# Patient Record
Sex: Female | Born: 1985 | Race: White | Hispanic: No | Smoking: Current every day smoker
Health system: Southern US, Community
[De-identification: ages and names within clinical notes are randomized; demographics above are authoritative.]

## PROBLEM LIST (undated history)

## (undated) ENCOUNTER — Inpatient Hospital Stay (HOSPITAL_COMMUNITY): Payer: Self-pay

## (undated) ENCOUNTER — Emergency Department (HOSPITAL_COMMUNITY): Admission: EM | Payer: Medicaid Other | Source: Home / Self Care

## (undated) DIAGNOSIS — F53 Postpartum depression: Secondary | ICD-10-CM

## (undated) DIAGNOSIS — E559 Vitamin D deficiency, unspecified: Secondary | ICD-10-CM

## (undated) DIAGNOSIS — R51 Headache: Secondary | ICD-10-CM

## (undated) DIAGNOSIS — K279 Peptic ulcer, site unspecified, unspecified as acute or chronic, without hemorrhage or perforation: Secondary | ICD-10-CM

## (undated) DIAGNOSIS — F419 Anxiety disorder, unspecified: Secondary | ICD-10-CM

## (undated) DIAGNOSIS — J189 Pneumonia, unspecified organism: Secondary | ICD-10-CM

## (undated) DIAGNOSIS — IMO0002 Reserved for concepts with insufficient information to code with codable children: Secondary | ICD-10-CM

## (undated) DIAGNOSIS — K219 Gastro-esophageal reflux disease without esophagitis: Secondary | ICD-10-CM

## (undated) DIAGNOSIS — O99345 Other mental disorders complicating the puerperium: Principal | ICD-10-CM

## (undated) DIAGNOSIS — R87619 Unspecified abnormal cytological findings in specimens from cervix uteri: Secondary | ICD-10-CM

## (undated) DIAGNOSIS — F909 Attention-deficit hyperactivity disorder, unspecified type: Secondary | ICD-10-CM

## (undated) DIAGNOSIS — F319 Bipolar disorder, unspecified: Secondary | ICD-10-CM

## (undated) DIAGNOSIS — G988 Other disorders of nervous system: Secondary | ICD-10-CM

## (undated) DIAGNOSIS — E785 Hyperlipidemia, unspecified: Secondary | ICD-10-CM

## (undated) DIAGNOSIS — R569 Unspecified convulsions: Secondary | ICD-10-CM

## (undated) DIAGNOSIS — E039 Hypothyroidism, unspecified: Secondary | ICD-10-CM

## (undated) DIAGNOSIS — F329 Major depressive disorder, single episode, unspecified: Secondary | ICD-10-CM

## (undated) DIAGNOSIS — R87629 Unspecified abnormal cytological findings in specimens from vagina: Secondary | ICD-10-CM

## (undated) DIAGNOSIS — D649 Anemia, unspecified: Secondary | ICD-10-CM

## (undated) DIAGNOSIS — F32A Depression, unspecified: Secondary | ICD-10-CM

## (undated) HISTORY — DX: Hypothyroidism, unspecified: E03.9

## (undated) HISTORY — DX: Peptic ulcer, site unspecified, unspecified as acute or chronic, without hemorrhage or perforation: K27.9

## (undated) HISTORY — DX: Vitamin D deficiency, unspecified: E55.9

## (undated) HISTORY — PX: NO PAST SURGERIES: SHX2092

## (undated) HISTORY — DX: Postpartum depression: F53.0

## (undated) HISTORY — DX: Other disorders of nervous system: G98.8

## (undated) HISTORY — DX: Hyperlipidemia, unspecified: E78.5

## (undated) HISTORY — DX: Unspecified abnormal cytological findings in specimens from vagina: R87.629

## (undated) HISTORY — DX: Other mental disorders complicating the puerperium: O99.345

---
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
## 2003-10-30 ENCOUNTER — Other Ambulatory Visit: Admission: RE | Admit: 2003-10-30 | Discharge: 2003-10-30 | Payer: Self-pay

## 2003-12-31 ENCOUNTER — Inpatient Hospital Stay (HOSPITAL_COMMUNITY): Admission: AD | Admit: 2003-12-31 | Discharge: 2003-12-31 | Payer: Self-pay | Admitting: Obstetrics & Gynecology

## 2004-05-11 ENCOUNTER — Emergency Department (HOSPITAL_COMMUNITY): Admission: EM | Admit: 2004-05-11 | Discharge: 2004-05-11 | Payer: Self-pay | Admitting: Emergency Medicine

## 2004-07-10 ENCOUNTER — Emergency Department (HOSPITAL_COMMUNITY): Admission: EM | Admit: 2004-07-10 | Discharge: 2004-07-10 | Payer: Self-pay | Admitting: Emergency Medicine

## 2004-09-13 ENCOUNTER — Emergency Department (HOSPITAL_COMMUNITY): Admission: EM | Admit: 2004-09-13 | Discharge: 2004-09-13 | Payer: Self-pay | Admitting: Emergency Medicine

## 2004-11-28 ENCOUNTER — Inpatient Hospital Stay (HOSPITAL_COMMUNITY): Admission: AD | Admit: 2004-11-28 | Discharge: 2004-11-28 | Payer: Self-pay | Admitting: Obstetrics

## 2004-12-22 ENCOUNTER — Ambulatory Visit (HOSPITAL_COMMUNITY): Payer: Self-pay | Admitting: Psychiatry

## 2005-01-14 ENCOUNTER — Ambulatory Visit (HOSPITAL_COMMUNITY): Payer: Self-pay | Admitting: Psychiatry

## 2005-03-05 ENCOUNTER — Inpatient Hospital Stay (HOSPITAL_COMMUNITY): Admission: AD | Admit: 2005-03-05 | Discharge: 2005-03-05 | Payer: Self-pay | Admitting: Obstetrics

## 2005-06-23 ENCOUNTER — Ambulatory Visit (HOSPITAL_COMMUNITY): Payer: Self-pay | Admitting: Physician Assistant

## 2005-07-20 ENCOUNTER — Ambulatory Visit (HOSPITAL_COMMUNITY): Payer: Self-pay | Admitting: Psychiatry

## 2005-07-27 ENCOUNTER — Emergency Department (HOSPITAL_COMMUNITY): Admission: EM | Admit: 2005-07-27 | Discharge: 2005-07-27 | Payer: Self-pay | Admitting: Emergency Medicine

## 2005-07-31 ENCOUNTER — Ambulatory Visit (HOSPITAL_COMMUNITY): Payer: Self-pay | Admitting: Psychiatry

## 2005-09-11 ENCOUNTER — Ambulatory Visit (HOSPITAL_COMMUNITY): Payer: Self-pay | Admitting: Psychiatry

## 2005-09-17 ENCOUNTER — Emergency Department (HOSPITAL_COMMUNITY): Admission: EM | Admit: 2005-09-17 | Discharge: 2005-09-17 | Payer: Self-pay | Admitting: Emergency Medicine

## 2005-09-21 ENCOUNTER — Other Ambulatory Visit: Admission: RE | Admit: 2005-09-21 | Discharge: 2005-09-21 | Payer: Self-pay | Admitting: Obstetrics and Gynecology

## 2005-09-22 ENCOUNTER — Emergency Department (HOSPITAL_COMMUNITY): Admission: EM | Admit: 2005-09-22 | Discharge: 2005-09-22 | Payer: Self-pay | Admitting: Emergency Medicine

## 2005-10-01 ENCOUNTER — Ambulatory Visit (HOSPITAL_COMMUNITY): Admission: RE | Admit: 2005-10-01 | Discharge: 2005-10-01 | Payer: Self-pay | Admitting: Family Medicine

## 2005-10-12 ENCOUNTER — Ambulatory Visit (HOSPITAL_COMMUNITY): Admission: RE | Admit: 2005-10-12 | Discharge: 2005-10-12 | Payer: Self-pay | Admitting: Family Medicine

## 2006-02-12 ENCOUNTER — Encounter: Payer: Self-pay | Admitting: Internal Medicine

## 2006-02-12 ENCOUNTER — Inpatient Hospital Stay (HOSPITAL_COMMUNITY): Admission: AD | Admit: 2006-02-12 | Discharge: 2006-02-14 | Payer: Self-pay | Admitting: Internal Medicine

## 2006-02-14 ENCOUNTER — Inpatient Hospital Stay (HOSPITAL_COMMUNITY): Admission: AD | Admit: 2006-02-14 | Discharge: 2006-02-17 | Payer: Self-pay | Admitting: Psychiatry

## 2006-02-15 ENCOUNTER — Ambulatory Visit: Payer: Self-pay | Admitting: Psychiatry

## 2006-07-31 ENCOUNTER — Emergency Department (HOSPITAL_COMMUNITY): Admission: EM | Admit: 2006-07-31 | Discharge: 2006-07-31 | Payer: Self-pay | Admitting: Emergency Medicine

## 2007-03-23 ENCOUNTER — Emergency Department (HOSPITAL_COMMUNITY): Admission: EM | Admit: 2007-03-23 | Discharge: 2007-03-23 | Payer: Self-pay | Admitting: Emergency Medicine

## 2007-10-30 ENCOUNTER — Emergency Department (HOSPITAL_COMMUNITY): Admission: EM | Admit: 2007-10-30 | Discharge: 2007-10-30 | Payer: Self-pay | Admitting: Emergency Medicine

## 2007-12-04 ENCOUNTER — Emergency Department (HOSPITAL_COMMUNITY): Admission: EM | Admit: 2007-12-04 | Discharge: 2007-12-04 | Payer: Self-pay | Admitting: Emergency Medicine

## 2007-12-27 ENCOUNTER — Inpatient Hospital Stay (HOSPITAL_COMMUNITY): Admission: EM | Admit: 2007-12-27 | Discharge: 2007-12-29 | Payer: Self-pay | Admitting: Emergency Medicine

## 2007-12-31 ENCOUNTER — Emergency Department (HOSPITAL_COMMUNITY): Admission: EM | Admit: 2007-12-31 | Discharge: 2007-12-31 | Payer: Self-pay | Admitting: Emergency Medicine

## 2008-01-30 ENCOUNTER — Ambulatory Visit (HOSPITAL_COMMUNITY): Payer: Self-pay | Admitting: Psychiatry

## 2008-02-05 ENCOUNTER — Emergency Department (HOSPITAL_COMMUNITY): Admission: EM | Admit: 2008-02-05 | Discharge: 2008-02-05 | Payer: Self-pay | Admitting: Emergency Medicine

## 2008-02-15 ENCOUNTER — Ambulatory Visit (HOSPITAL_COMMUNITY): Payer: Self-pay | Admitting: Psychiatry

## 2008-03-12 ENCOUNTER — Observation Stay (HOSPITAL_COMMUNITY): Admission: EM | Admit: 2008-03-12 | Discharge: 2008-03-12 | Payer: Self-pay | Admitting: Emergency Medicine

## 2008-03-18 ENCOUNTER — Other Ambulatory Visit: Payer: Self-pay | Admitting: Emergency Medicine

## 2008-03-19 ENCOUNTER — Inpatient Hospital Stay (HOSPITAL_COMMUNITY): Admission: AD | Admit: 2008-03-19 | Discharge: 2008-03-21 | Payer: Self-pay | Admitting: *Deleted

## 2008-03-19 ENCOUNTER — Ambulatory Visit: Payer: Self-pay | Admitting: *Deleted

## 2008-03-20 ENCOUNTER — Encounter: Payer: Self-pay | Admitting: Emergency Medicine

## 2008-03-21 ENCOUNTER — Encounter: Payer: Self-pay | Admitting: Emergency Medicine

## 2008-03-22 ENCOUNTER — Inpatient Hospital Stay (HOSPITAL_COMMUNITY): Admission: RE | Admit: 2008-03-22 | Discharge: 2008-03-26 | Payer: Self-pay | Admitting: *Deleted

## 2008-04-05 ENCOUNTER — Inpatient Hospital Stay (HOSPITAL_COMMUNITY): Admission: AD | Admit: 2008-04-05 | Discharge: 2008-04-05 | Payer: Self-pay | Admitting: Obstetrics and Gynecology

## 2008-04-09 ENCOUNTER — Emergency Department (HOSPITAL_COMMUNITY): Admission: EM | Admit: 2008-04-09 | Discharge: 2008-04-09 | Payer: Self-pay | Admitting: Emergency Medicine

## 2008-04-10 ENCOUNTER — Ambulatory Visit (HOSPITAL_COMMUNITY): Admission: RE | Admit: 2008-04-10 | Discharge: 2008-04-10 | Payer: Self-pay | Admitting: Psychiatry

## 2008-04-13 ENCOUNTER — Ambulatory Visit (HOSPITAL_COMMUNITY): Payer: Self-pay | Admitting: Psychiatry

## 2008-04-20 ENCOUNTER — Other Ambulatory Visit: Payer: Self-pay | Admitting: Emergency Medicine

## 2008-04-20 ENCOUNTER — Inpatient Hospital Stay (HOSPITAL_COMMUNITY): Admission: AD | Admit: 2008-04-20 | Discharge: 2008-04-23 | Payer: Self-pay | Admitting: *Deleted

## 2008-04-23 ENCOUNTER — Emergency Department (HOSPITAL_COMMUNITY): Admission: EM | Admit: 2008-04-23 | Discharge: 2008-04-24 | Payer: Self-pay | Admitting: Emergency Medicine

## 2008-04-30 ENCOUNTER — Ambulatory Visit (HOSPITAL_COMMUNITY): Payer: Self-pay | Admitting: Psychiatry
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
## 2008-05-29 ENCOUNTER — Other Ambulatory Visit: Payer: Self-pay | Admitting: Emergency Medicine

## 2008-05-29 ENCOUNTER — Inpatient Hospital Stay (HOSPITAL_COMMUNITY): Admission: AD | Admit: 2008-05-29 | Discharge: 2008-06-01 | Payer: Self-pay | Admitting: *Deleted

## 2008-05-29 ENCOUNTER — Ambulatory Visit: Payer: Self-pay | Admitting: *Deleted

## 2008-06-04 ENCOUNTER — Other Ambulatory Visit: Payer: Self-pay | Admitting: Emergency Medicine

## 2008-06-06 ENCOUNTER — Inpatient Hospital Stay (HOSPITAL_COMMUNITY): Admission: RE | Admit: 2008-06-06 | Discharge: 2008-06-07 | Payer: Self-pay | Admitting: Psychiatry

## 2008-07-30 ENCOUNTER — Emergency Department (HOSPITAL_COMMUNITY): Admission: EM | Admit: 2008-07-30 | Discharge: 2008-07-30 | Payer: Self-pay | Admitting: Emergency Medicine

## 2008-08-01 ENCOUNTER — Emergency Department (HOSPITAL_COMMUNITY): Admission: EM | Admit: 2008-08-01 | Discharge: 2008-08-02 | Payer: Self-pay | Admitting: Emergency Medicine

## 2008-09-04 ENCOUNTER — Emergency Department (HOSPITAL_COMMUNITY): Admission: EM | Admit: 2008-09-04 | Discharge: 2008-09-05 | Payer: Self-pay | Admitting: *Deleted

## 2008-09-04 ENCOUNTER — Emergency Department (HOSPITAL_COMMUNITY): Admission: EM | Admit: 2008-09-04 | Discharge: 2008-09-05 | Payer: Self-pay | Admitting: Emergency Medicine

## 2008-09-05 ENCOUNTER — Emergency Department (HOSPITAL_COMMUNITY): Admission: EM | Admit: 2008-09-05 | Discharge: 2008-09-06 | Payer: Self-pay | Admitting: Emergency Medicine

## 2008-09-05 ENCOUNTER — Inpatient Hospital Stay (HOSPITAL_COMMUNITY): Admission: AD | Admit: 2008-09-05 | Discharge: 2008-09-05 | Payer: Self-pay | Admitting: Obstetrics & Gynecology

## 2008-09-06 ENCOUNTER — Emergency Department (HOSPITAL_COMMUNITY): Admission: EM | Admit: 2008-09-06 | Discharge: 2008-09-06 | Payer: Self-pay | Admitting: Emergency Medicine

## 2008-09-14 ENCOUNTER — Emergency Department (HOSPITAL_COMMUNITY): Admission: EM | Admit: 2008-09-14 | Discharge: 2008-09-14 | Payer: Self-pay | Admitting: Emergency Medicine

## 2008-09-24 ENCOUNTER — Ambulatory Visit: Payer: Self-pay | Admitting: Psychiatry

## 2008-09-24 ENCOUNTER — Inpatient Hospital Stay (HOSPITAL_COMMUNITY): Admission: RE | Admit: 2008-09-24 | Discharge: 2008-09-29 | Payer: Self-pay | Admitting: Psychiatry

## 2008-09-30 ENCOUNTER — Inpatient Hospital Stay (HOSPITAL_COMMUNITY): Admission: AD | Admit: 2008-09-30 | Discharge: 2008-09-30 | Payer: Self-pay | Admitting: Obstetrics and Gynecology

## 2008-10-06 ENCOUNTER — Inpatient Hospital Stay (HOSPITAL_COMMUNITY): Admission: EM | Admit: 2008-10-06 | Discharge: 2008-10-08 | Payer: Self-pay | Admitting: Psychiatry

## 2008-10-06 ENCOUNTER — Other Ambulatory Visit: Payer: Self-pay | Admitting: Emergency Medicine

## 2008-10-15 ENCOUNTER — Emergency Department (HOSPITAL_COMMUNITY): Admission: EM | Admit: 2008-10-15 | Discharge: 2008-10-15 | Payer: Self-pay | Admitting: Emergency Medicine

## 2008-10-19 ENCOUNTER — Emergency Department (HOSPITAL_COMMUNITY): Admission: EM | Admit: 2008-10-19 | Discharge: 2008-10-19 | Payer: Self-pay | Admitting: Emergency Medicine

## 2008-10-20 ENCOUNTER — Emergency Department (HOSPITAL_COMMUNITY): Admission: EM | Admit: 2008-10-20 | Discharge: 2008-10-20 | Payer: Self-pay | Admitting: Emergency Medicine

## 2008-10-28 ENCOUNTER — Emergency Department (HOSPITAL_COMMUNITY): Admission: EM | Admit: 2008-10-28 | Discharge: 2008-10-28 | Payer: Self-pay | Admitting: Emergency Medicine

## 2009-01-22 ENCOUNTER — Inpatient Hospital Stay (HOSPITAL_COMMUNITY): Admission: RE | Admit: 2009-01-22 | Discharge: 2009-01-25 | Payer: Self-pay | Admitting: *Deleted

## 2009-01-23 ENCOUNTER — Ambulatory Visit: Payer: Self-pay | Admitting: *Deleted

## 2009-01-26 ENCOUNTER — Inpatient Hospital Stay (HOSPITAL_COMMUNITY): Admission: AD | Admit: 2009-01-26 | Discharge: 2009-01-29 | Payer: Self-pay | Admitting: *Deleted

## 2009-02-08 ENCOUNTER — Other Ambulatory Visit: Payer: Self-pay | Admitting: Emergency Medicine

## 2009-02-09 ENCOUNTER — Inpatient Hospital Stay (HOSPITAL_COMMUNITY): Admission: RE | Admit: 2009-02-09 | Discharge: 2009-02-15 | Payer: Self-pay | Admitting: Psychiatry

## 2009-02-15 ENCOUNTER — Emergency Department (HOSPITAL_COMMUNITY): Admission: EM | Admit: 2009-02-15 | Discharge: 2009-02-16 | Payer: Self-pay | Admitting: Emergency Medicine

## 2009-02-17 ENCOUNTER — Other Ambulatory Visit: Payer: Self-pay | Admitting: Emergency Medicine

## 2009-02-18 ENCOUNTER — Inpatient Hospital Stay (HOSPITAL_COMMUNITY): Admission: RE | Admit: 2009-02-18 | Discharge: 2009-02-20 | Payer: Self-pay | Admitting: Psychiatry

## 2009-02-18 ENCOUNTER — Other Ambulatory Visit: Payer: Self-pay | Admitting: Emergency Medicine

## 2009-02-24 ENCOUNTER — Emergency Department (HOSPITAL_COMMUNITY): Admission: EM | Admit: 2009-02-24 | Discharge: 2009-02-24 | Payer: Self-pay | Admitting: Emergency Medicine

## 2009-02-26 ENCOUNTER — Inpatient Hospital Stay (HOSPITAL_COMMUNITY): Admission: RE | Admit: 2009-02-26 | Discharge: 2009-03-01 | Payer: Self-pay | Admitting: *Deleted

## 2009-02-26 ENCOUNTER — Ambulatory Visit: Payer: Self-pay | Admitting: *Deleted

## 2009-03-09 ENCOUNTER — Inpatient Hospital Stay (HOSPITAL_COMMUNITY): Admission: EM | Admit: 2009-03-09 | Discharge: 2009-03-13 | Payer: Self-pay | Admitting: *Deleted

## 2009-04-03 ENCOUNTER — Inpatient Hospital Stay (HOSPITAL_COMMUNITY): Admission: AD | Admit: 2009-04-03 | Discharge: 2009-04-04 | Payer: Self-pay | Admitting: Family Medicine

## 2009-04-10 ENCOUNTER — Emergency Department (HOSPITAL_COMMUNITY): Admission: EM | Admit: 2009-04-10 | Discharge: 2009-04-10 | Payer: Self-pay | Admitting: Emergency Medicine

## 2009-06-07 IMAGING — US US TRANSVAGINAL NON-OB
1 series · 14 of 25 positions shown · non-contrast
Comparison: Abdominal CT 09/06/2008.

CLINICAL DATA: Abdominal pain.  Pelvic pain.

TRANSABDOMINAL AND TRANSVAGINAL ULTRASOUND OF PELVIS
TECHNIQUE: Both transabdominal and transvaginal ultrasound
examinations of the pelvis were performed including evaluation of
the uterus, ovaries, adnexal regions, and pelvic cul-de-sac.

[Series 1: us transvaginal non-ob · 0.33mm/px · 14 of 32 slices shown]
[im 1/32]
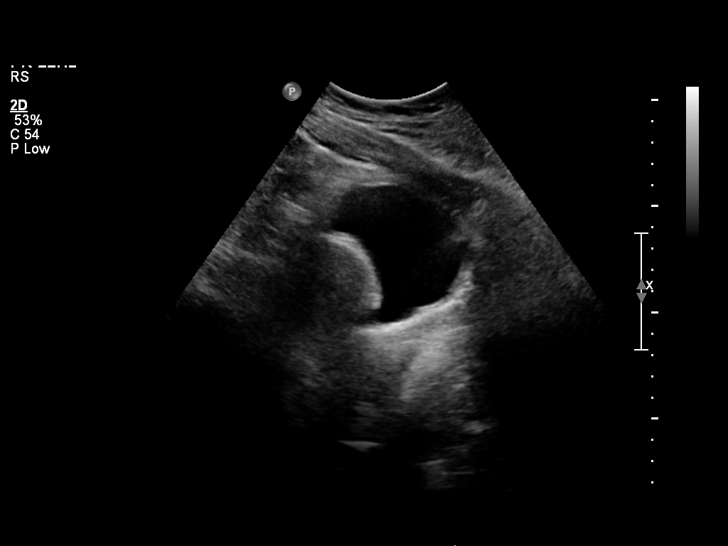
[im 3/32]
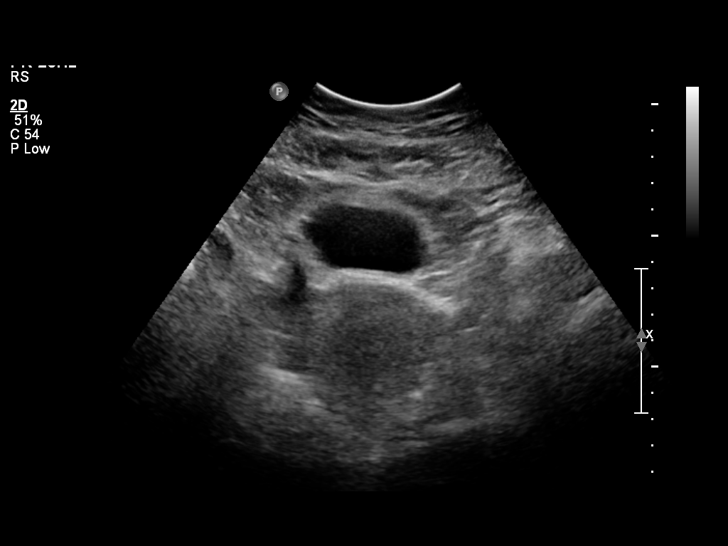
[im 6/32]
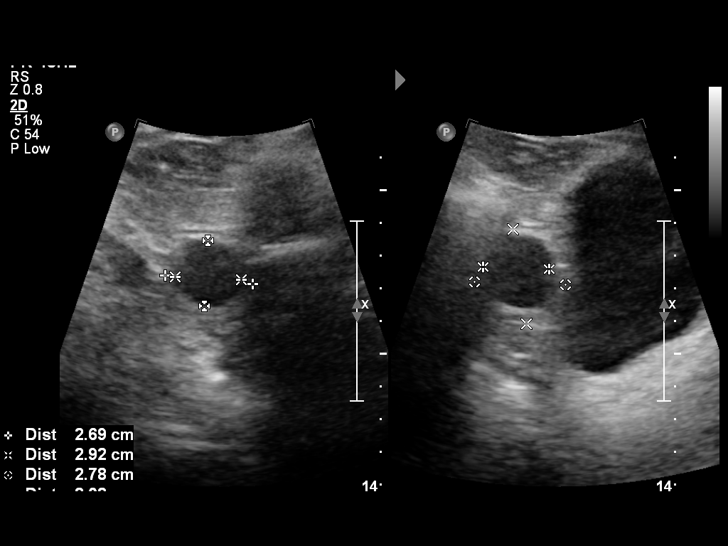
[im 8/32]
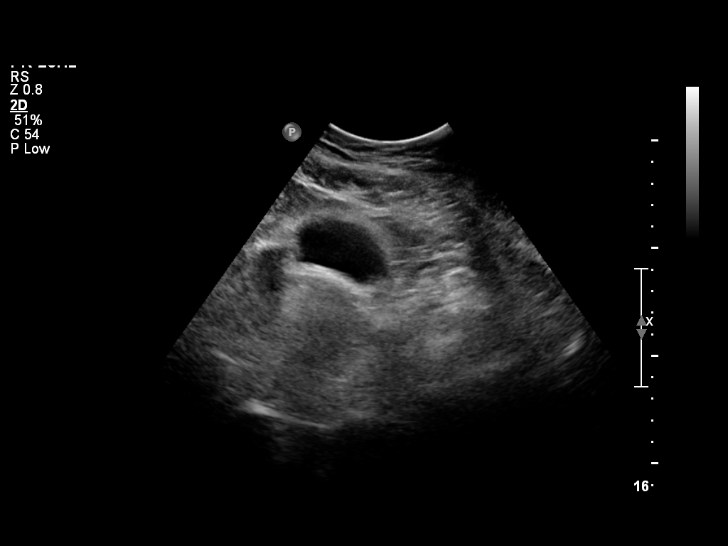
[im 11/32]
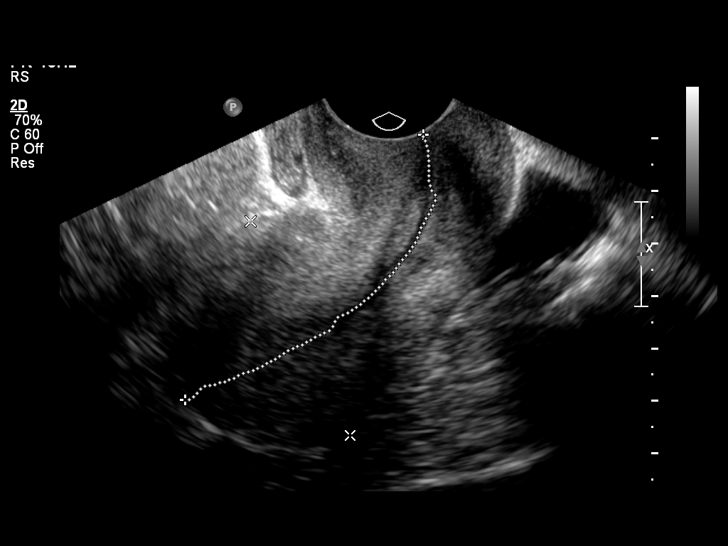
[im 12/32]
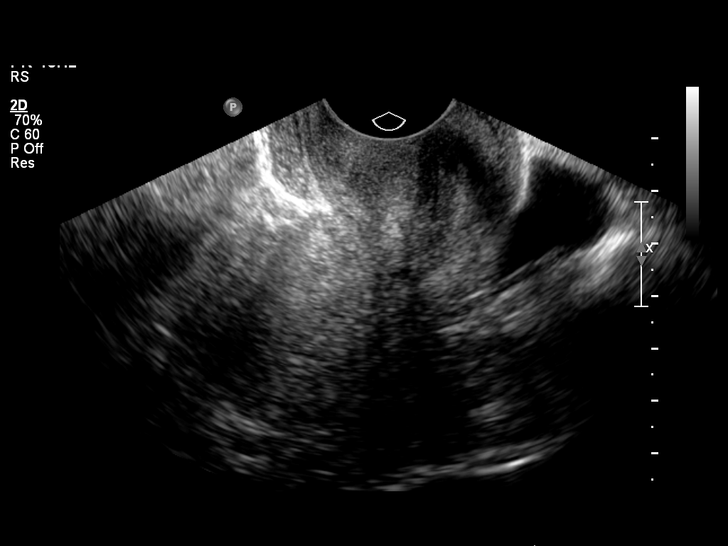
[im 15/32]
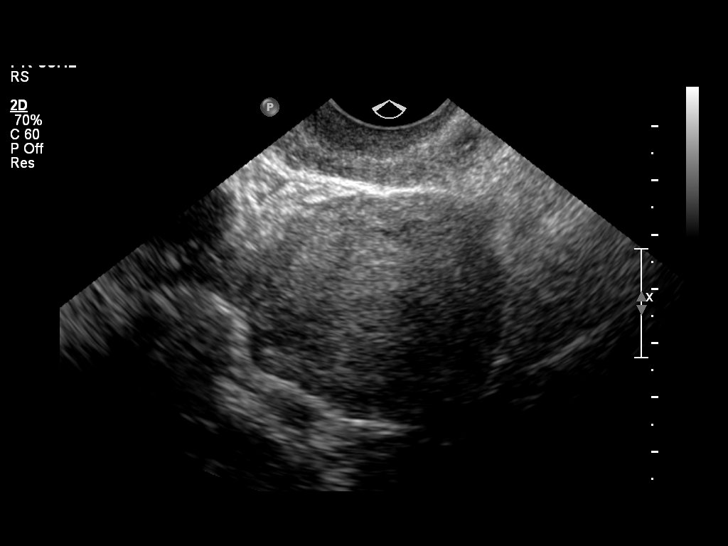
[im 17/32]
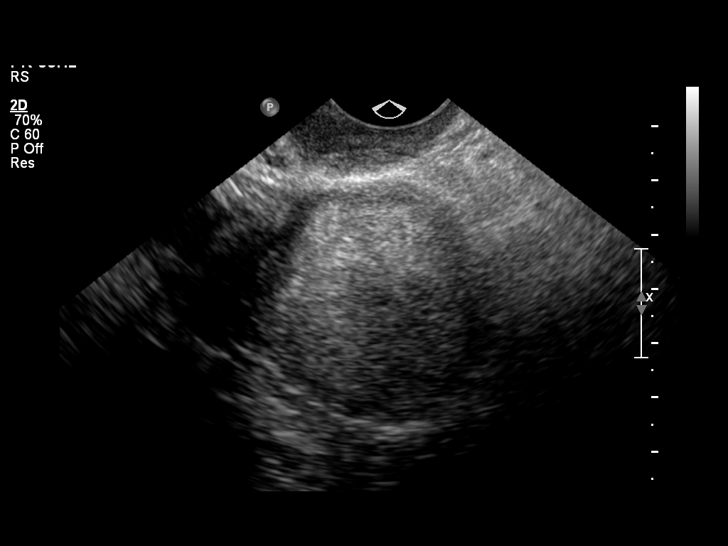
[im 20/32]
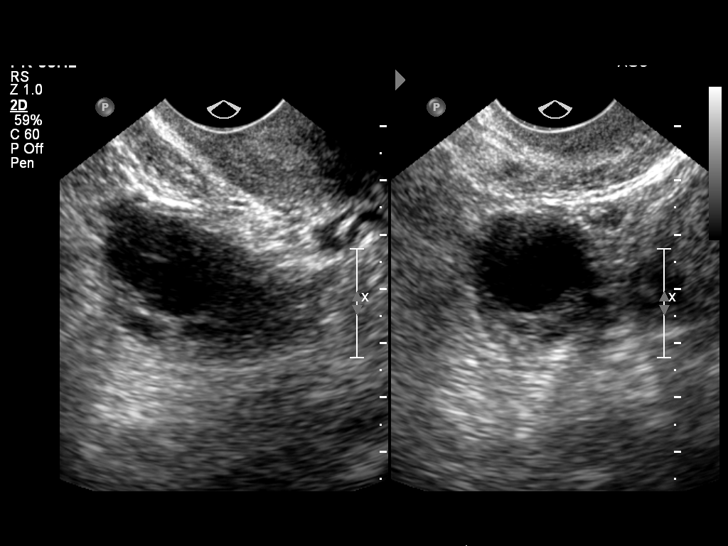
[im 21/32]
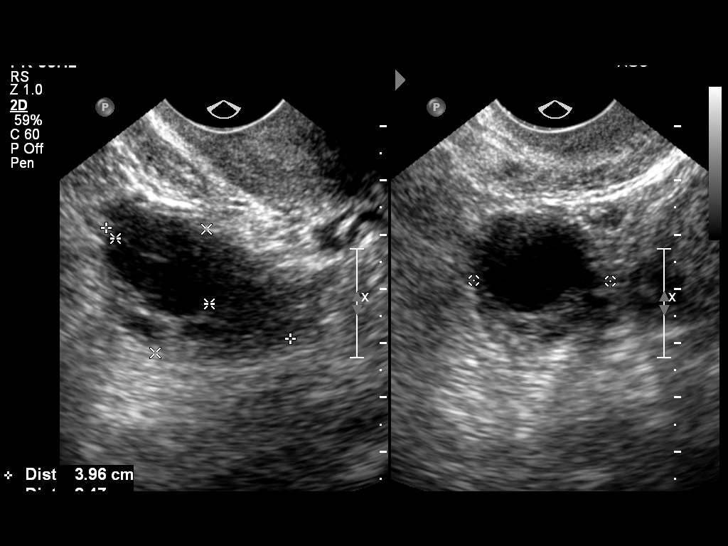
[im 24/32]
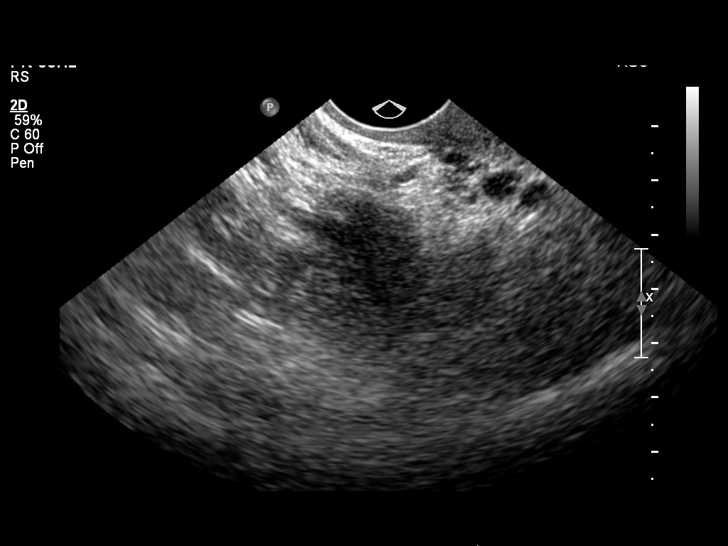
[im 26/32]
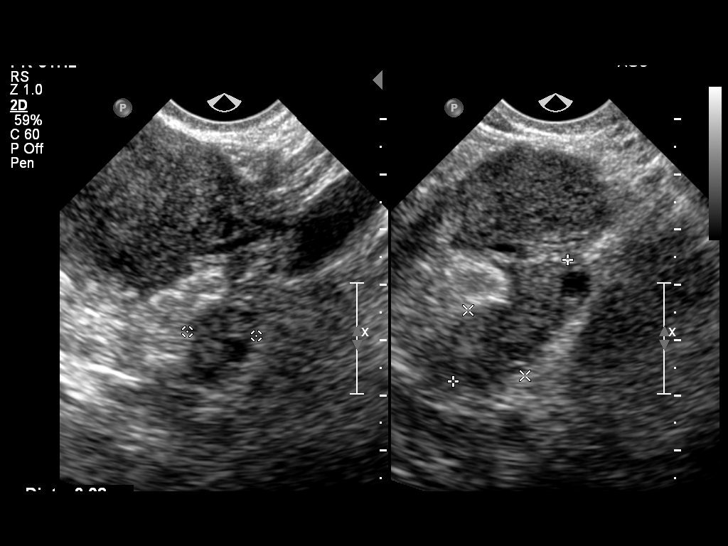
[im 29/32]
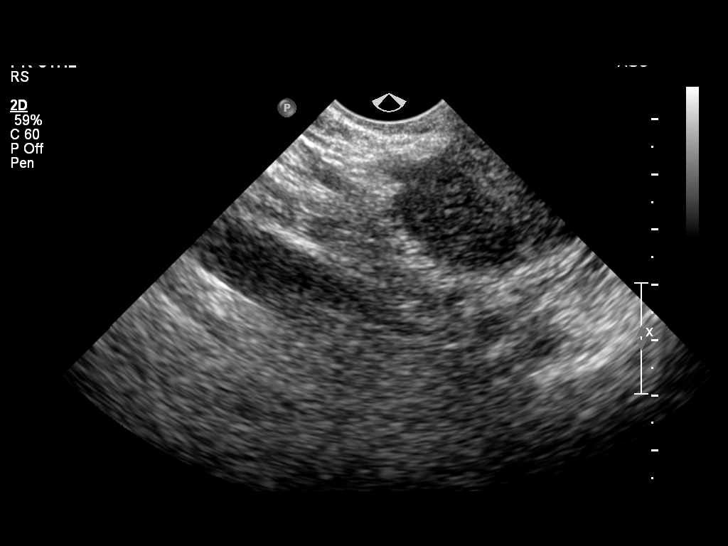
[im 32/32]
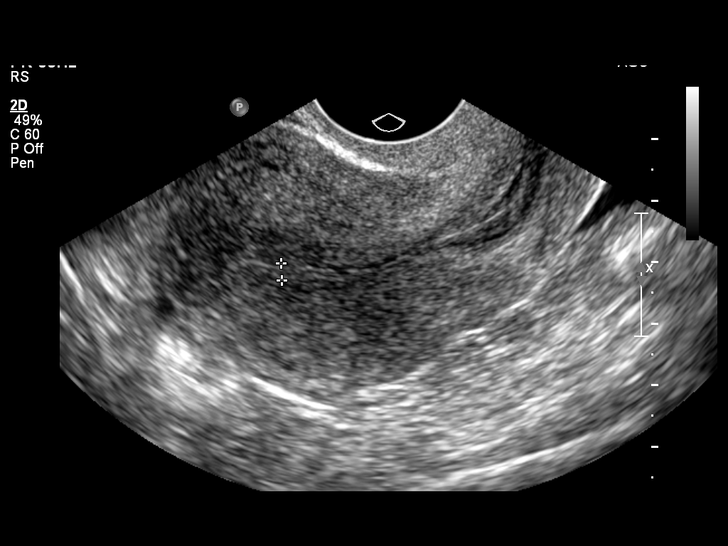

[14 of 25 positions shown; findings below may reference images not displayed]

FINDINGS: Uterus:  Uterus appears within normal limits, measuring

Endometrium:  Normal, 3 mm

Right Ovary:  4.0 x 2.5 x 2.5 cm.  2 cm dominant cyst or follicle
is present.  This appears simple and anechoic with increased
through transmission.

Left Ovary:  3.0 x 1.6 x 1.3 cm.  Normal echotexture.

Other Findings:  Small amount of simple free fluid is present in
the pouch of Filisberto.
IMPRESSION: Normal pelvic ultrasound aside from a small amount of free fluid in
the anatomic pelvis, which is probably physiologic.  This can be
associated with pelvic pain.

## 2009-06-26 ENCOUNTER — Emergency Department (HOSPITAL_COMMUNITY): Admission: EM | Admit: 2009-06-26 | Discharge: 2009-06-26 | Payer: Self-pay | Admitting: Emergency Medicine

## 2009-07-12 ENCOUNTER — Encounter: Admission: RE | Admit: 2009-07-12 | Discharge: 2009-07-12 | Payer: Self-pay | Admitting: Family Medicine

## 2009-07-28 ENCOUNTER — Other Ambulatory Visit: Payer: Self-pay | Admitting: Emergency Medicine

## 2009-07-29 ENCOUNTER — Ambulatory Visit: Payer: Self-pay | Admitting: *Deleted

## 2009-07-29 ENCOUNTER — Inpatient Hospital Stay (HOSPITAL_COMMUNITY): Admission: RE | Admit: 2009-07-29 | Discharge: 2009-07-30 | Payer: Self-pay | Admitting: *Deleted

## 2009-08-09 ENCOUNTER — Other Ambulatory Visit: Payer: Self-pay

## 2009-08-09 ENCOUNTER — Other Ambulatory Visit: Payer: Self-pay | Admitting: Emergency Medicine

## 2009-08-09 ENCOUNTER — Inpatient Hospital Stay (HOSPITAL_COMMUNITY): Admission: EM | Admit: 2009-08-09 | Discharge: 2009-08-11 | Payer: Self-pay | Admitting: *Deleted

## 2009-12-22 ENCOUNTER — Emergency Department (HOSPITAL_COMMUNITY): Admission: EM | Admit: 2009-12-22 | Discharge: 2009-12-24 | Payer: Self-pay | Admitting: Emergency Medicine

## 2009-12-30 ENCOUNTER — Other Ambulatory Visit: Payer: Self-pay

## 2009-12-31 ENCOUNTER — Ambulatory Visit: Payer: Self-pay | Admitting: Psychiatry

## 2009-12-31 ENCOUNTER — Inpatient Hospital Stay (HOSPITAL_COMMUNITY): Admission: AD | Admit: 2009-12-31 | Discharge: 2010-01-03 | Payer: Self-pay | Admitting: Psychiatry

## 2010-04-07 ENCOUNTER — Emergency Department (HOSPITAL_COMMUNITY): Admission: EM | Admit: 2010-04-07 | Discharge: 2010-04-07 | Payer: Self-pay | Admitting: Emergency Medicine

## 2010-08-12 ENCOUNTER — Emergency Department (HOSPITAL_COMMUNITY): Admission: EM | Admit: 2010-08-12 | Discharge: 2010-08-12 | Payer: Self-pay | Admitting: Emergency Medicine

## 2010-10-19 ENCOUNTER — Emergency Department (HOSPITAL_COMMUNITY): Admission: EM | Admit: 2010-10-19 | Discharge: 2010-10-19 | Payer: Self-pay | Admitting: Emergency Medicine

## 2010-10-31 ENCOUNTER — Emergency Department (HOSPITAL_COMMUNITY)
Admission: EM | Admit: 2010-10-31 | Discharge: 2010-10-31 | Payer: Self-pay | Source: Home / Self Care | Admitting: Emergency Medicine

## 2010-11-01 ENCOUNTER — Emergency Department (HOSPITAL_COMMUNITY)
Admission: EM | Admit: 2010-11-01 | Discharge: 2010-11-01 | Payer: Self-pay | Source: Home / Self Care | Admitting: Emergency Medicine

## 2010-11-02 ENCOUNTER — Emergency Department (HOSPITAL_COMMUNITY)
Admission: EM | Admit: 2010-11-02 | Discharge: 2010-11-02 | Payer: Self-pay | Source: Home / Self Care | Admitting: Emergency Medicine

## 2010-12-13 ENCOUNTER — Encounter: Payer: Self-pay | Admitting: Family Medicine

## 2010-12-14 ENCOUNTER — Encounter: Payer: Self-pay | Admitting: Obstetrics & Gynecology

## 2010-12-15 ENCOUNTER — Encounter: Payer: Self-pay | Admitting: Family Medicine

## 2011-02-03 LAB — BASIC METABOLIC PANEL
CO2: 22 mEq/L (ref 19–32)
Calcium: 9.4 mg/dL (ref 8.4–10.5)
Creatinine, Ser: 0.62 mg/dL (ref 0.4–1.2)
GFR calc Af Amer: >60 mL/min (ref >60)
GFR calc non Af Amer: >60 mL/min (ref >60)
Glucose, Bld: 108 mg/dL — ABNORMAL HIGH (ref 70–99)
Sodium: 137 mEq/L (ref 135–145)

## 2011-02-03 LAB — URINE MICROSCOPIC-ADD ON

## 2011-02-03 LAB — URINALYSIS, ROUTINE W REFLEX MICROSCOPIC
Bilirubin Urine: NEGATIVE
Specific Gravity, Urine: 1.01 (ref 1.005–1.030)
Urobilinogen, UA: 0.2 mg/dL (ref 0.0–1.0)
pH: 5.5 (ref 5.0–8.0)

## 2011-02-03 LAB — CBC
Hemoglobin: 13.9 g/dL (ref 12.0–15.0)
MCH: 29.8 pg (ref 26.0–34.0)
MCHC: 35.2 g/dL (ref 30.0–36.0)
Platelets: 286 10*3/uL (ref 150–400)
RBC: 4.66 MIL/uL (ref 3.87–5.11)

## 2011-02-03 LAB — DIFFERENTIAL
Eosinophils Absolute: 0 10*3/uL (ref 0.0–0.7)
Lymphocytes Relative: 12 % (ref 12–46)
Monocytes Absolute: 1.6 10*3/uL — ABNORMAL HIGH (ref 0.1–1.0)
Neutrophils Relative %: 81 % — ABNORMAL HIGH (ref 43–77)

## 2011-02-03 LAB — POCT PREGNANCY, URINE: Preg Test, Ur: NEGATIVE

## 2011-02-05 LAB — POCT I-STAT, CHEM 8
BUN: 10 mg/dL (ref 6–23)
Chloride: 104 mEq/L (ref 96–112)
Potassium: 3.8 mEq/L (ref 3.5–5.1)
Sodium: 137 mEq/L (ref 135–145)
TCO2: 24 mmol/L (ref 0–100)

## 2011-02-05 LAB — CBC
Platelets: 285 10*3/uL (ref 150–400)
RBC: 4.45 MIL/uL (ref 3.87–5.11)
RDW: 14.8 % (ref 11.5–15.5)
WBC: 13.5 10*3/uL — ABNORMAL HIGH (ref 4.0–10.5)

## 2011-02-05 LAB — POCT CARDIAC MARKERS
CKMB, poc: <1 ng/mL — ABNORMAL LOW (ref 1.0–8.0)
Myoglobin, poc: 29.6 ng/mL (ref 12–200)
Troponin i, poc: <0.05 ng/mL (ref 0.00–0.09)

## 2011-02-05 LAB — DIFFERENTIAL
Basophils Absolute: 0 10*3/uL (ref 0.0–0.1)
Lymphocytes Relative: 26 % (ref 12–46)
Lymphs Abs: 3.5 10*3/uL (ref 0.7–4.0)
Neutro Abs: 8.5 10*3/uL — ABNORMAL HIGH (ref 1.7–7.7)
Neutrophils Relative %: 63 % (ref 43–77)

## 2011-02-08 LAB — BASIC METABOLIC PANEL
CO2: 27 mEq/L (ref 19–32)
Calcium: 8.7 mg/dL (ref 8.4–10.5)
Creatinine, Ser: 0.67 mg/dL (ref 0.4–1.2)
GFR calc Af Amer: >60 mL/min (ref >60)
GFR calc non Af Amer: >60 mL/min (ref >60)

## 2011-02-08 LAB — CBC
MCHC: 34.7 g/dL (ref 30.0–36.0)
RBC: 4.33 MIL/uL (ref 3.87–5.11)
RDW: 13.4 % (ref 11.5–15.5)

## 2011-02-08 LAB — URINALYSIS, ROUTINE W REFLEX MICROSCOPIC
Protein, ur: NEGATIVE mg/dL
Urobilinogen, UA: 0.2 mg/dL (ref 0.0–1.0)

## 2011-02-08 LAB — DIFFERENTIAL
Basophils Absolute: 0.2 10*3/uL — ABNORMAL HIGH (ref 0.0–0.1)
Basophils Relative: 2 % — ABNORMAL HIGH (ref 0–1)
Lymphocytes Relative: 28 % (ref 12–46)
Monocytes Relative: 8 % (ref 3–12)
Neutro Abs: 6.6 10*3/uL (ref 1.7–7.7)
Neutrophils Relative %: 60 % (ref 43–77)

## 2011-02-08 LAB — RAPID URINE DRUG SCREEN, HOSP PERFORMED: Barbiturates: NOT DETECTED

## 2011-02-11 LAB — URINALYSIS, ROUTINE W REFLEX MICROSCOPIC
Glucose, UA: NEGATIVE mg/dL
Ketones, ur: NEGATIVE mg/dL
Specific Gravity, Urine: 1.016 (ref 1.005–1.030)
pH: 5 (ref 5.0–8.0)

## 2011-02-11 LAB — RAPID URINE DRUG SCREEN, HOSP PERFORMED
Barbiturates: NOT DETECTED
Benzodiazepines: NOT DETECTED

## 2011-02-11 LAB — COMPREHENSIVE METABOLIC PANEL
AST: 23 U/L (ref 0–37)
BUN: 3 mg/dL — ABNORMAL LOW (ref 6–23)
CO2: 25 mEq/L (ref 19–32)
Chloride: 104 mEq/L (ref 96–112)
Creatinine, Ser: 0.77 mg/dL (ref 0.4–1.2)
GFR calc non Af Amer: >60 mL/min (ref >60)
Total Bilirubin: <0.1 mg/dL — ABNORMAL LOW (ref 0.3–1.2)

## 2011-02-11 LAB — CBC
HCT: 41.7 % (ref 36.0–46.0)
Hemoglobin: 14.4 g/dL (ref 12.0–15.0)
MCV: 94.1 fL (ref 78.0–100.0)
RBC: 4.43 MIL/uL (ref 3.87–5.11)
WBC: 13.6 10*3/uL — ABNORMAL HIGH (ref 4.0–10.5)

## 2011-02-11 LAB — DIFFERENTIAL
Basophils Absolute: 0 10*3/uL (ref 0.0–0.1)
Eosinophils Relative: 1 % (ref 0–5)
Lymphocytes Relative: 27 % (ref 12–46)
Neutro Abs: 8.6 10*3/uL — ABNORMAL HIGH (ref 1.7–7.7)

## 2011-02-11 LAB — VALPROIC ACID LEVEL: Valproic Acid Lvl: 91.7 ug/mL (ref 50.0–100.0)

## 2011-02-17 ENCOUNTER — Emergency Department (HOSPITAL_COMMUNITY)
Admission: EM | Admit: 2011-02-17 | Discharge: 2011-02-17 | Discharge status: Against Medical Advice | Payer: Self-pay | Attending: Emergency Medicine | Admitting: Emergency Medicine

## 2011-02-17 DIAGNOSIS — R079 Chest pain, unspecified: Secondary | ICD-10-CM | POA: Insufficient documentation

## 2011-02-27 LAB — URINALYSIS, ROUTINE W REFLEX MICROSCOPIC
Bilirubin Urine: NEGATIVE
Glucose, UA: NEGATIVE mg/dL
Hgb urine dipstick: NEGATIVE
Hgb urine dipstick: NEGATIVE
Ketones, ur: NEGATIVE mg/dL
Nitrite: NEGATIVE
Specific Gravity, Urine: 1.01 (ref 1.005–1.030)
Urobilinogen, UA: 0.2 mg/dL (ref 0.0–1.0)
pH: 5.5 (ref 5.0–8.0)

## 2011-02-27 LAB — POCT I-STAT, CHEM 8
Creatinine, Ser: 0.8 mg/dL (ref 0.4–1.2)
Glucose, Bld: 78 mg/dL (ref 70–99)
Hemoglobin: 12.9 g/dL (ref 12.0–15.0)
Potassium: 3.7 mEq/L (ref 3.5–5.1)
TCO2: 21 mmol/L (ref 0–100)

## 2011-02-27 LAB — ETHANOL: Alcohol, Ethyl (B): <5 mg/dL (ref 0–10)

## 2011-02-27 LAB — RAPID URINE DRUG SCREEN, HOSP PERFORMED
Amphetamines: NOT DETECTED
Amphetamines: NOT DETECTED
Barbiturates: NOT DETECTED
Barbiturates: NOT DETECTED
Benzodiazepines: NOT DETECTED
Cocaine: NOT DETECTED
Opiates: NOT DETECTED
Opiates: NOT DETECTED
Tetrahydrocannabinol: NOT DETECTED

## 2011-02-27 LAB — BASIC METABOLIC PANEL
Calcium: 8.8 mg/dL (ref 8.4–10.5)
GFR calc Af Amer: >60 mL/min (ref >60)
GFR calc non Af Amer: >60 mL/min (ref >60)
Sodium: 140 mEq/L (ref 135–145)

## 2011-02-27 LAB — DIFFERENTIAL
Lymphocytes Relative: 29 % (ref 12–46)
Monocytes Absolute: 1.1 10*3/uL — ABNORMAL HIGH (ref 0.1–1.0)
Monocytes Relative: 8 % (ref 3–12)
Neutro Abs: 9.1 10*3/uL — ABNORMAL HIGH (ref 1.7–7.7)
Neutrophils Relative %: 62 % (ref 43–77)

## 2011-02-27 LAB — CBC
Hemoglobin: 13.9 g/dL (ref 12.0–15.0)
RBC: 4.41 MIL/uL (ref 3.87–5.11)
WBC: 14.8 10*3/uL — ABNORMAL HIGH (ref 4.0–10.5)

## 2011-03-03 LAB — COMPREHENSIVE METABOLIC PANEL
ALT: 13 U/L (ref 0–35)
AST: 18 U/L (ref 0–37)
AST: 18 U/L (ref 0–37)
Albumin: 3.8 g/dL (ref 3.5–5.2)
Albumin: 3.4 g/dL — ABNORMAL LOW (ref 3.5–5.2)
Alkaline Phosphatase: 77 U/L (ref 39–117)
Alkaline Phosphatase: 72 U/L (ref 39–117)
CO2: 23 mEq/L (ref 19–32)
Calcium: 9.1 mg/dL (ref 8.4–10.5)
Chloride: 106 mEq/L (ref 96–112)
Creatinine, Ser: 0.71 mg/dL (ref 0.4–1.2)
GFR calc Af Amer: >60 mL/min (ref >60)
GFR calc Af Amer: >60 mL/min (ref >60)
GFR calc non Af Amer: >60 mL/min (ref >60)
Potassium: 3.4 mEq/L — ABNORMAL LOW (ref 3.5–5.1)
Potassium: 3.4 mEq/L — ABNORMAL LOW (ref 3.5–5.1)
Sodium: 139 mEq/L (ref 135–145)
Total Bilirubin: 0.4 mg/dL (ref 0.3–1.2)
Total Protein: 6.5 g/dL (ref 6.0–8.3)

## 2011-03-03 LAB — DIFFERENTIAL
Basophils Absolute: 0.1 10*3/uL (ref 0.0–0.1)
Basophils Relative: 0 % (ref 0–1)
Basophils Relative: 1 % (ref 0–1)
Eosinophils Absolute: 0.1 10*3/uL (ref 0.0–0.7)
Eosinophils Absolute: 0.2 10*3/uL (ref 0.0–0.7)
Eosinophils Relative: 2 % (ref 0–5)
Lymphs Abs: 3.4 10*3/uL (ref 0.7–4.0)
Monocytes Absolute: 0.9 10*3/uL (ref 0.1–1.0)
Monocytes Absolute: 1 10*3/uL (ref 0.1–1.0)
Monocytes Relative: 7 % (ref 3–12)

## 2011-03-03 LAB — RAPID URINE DRUG SCREEN, HOSP PERFORMED
Amphetamines: NOT DETECTED
Opiates: NOT DETECTED
Tetrahydrocannabinol: NOT DETECTED

## 2011-03-03 LAB — WET PREP, GENITAL
Trich, Wet Prep: NONE SEEN
Yeast Wet Prep HPF POC: NONE SEEN

## 2011-03-03 LAB — URINALYSIS, ROUTINE W REFLEX MICROSCOPIC
Bilirubin Urine: NEGATIVE
Glucose, UA: NEGATIVE mg/dL
Hgb urine dipstick: NEGATIVE
Protein, ur: NEGATIVE mg/dL
Urobilinogen, UA: 0.2 mg/dL (ref 0.0–1.0)

## 2011-03-03 LAB — CBC
HCT: 29.1 % — ABNORMAL LOW (ref 36.0–46.0)
Hemoglobin: 10.1 g/dL — ABNORMAL LOW (ref 12.0–15.0)
MCV: 79.8 fL (ref 78.0–100.0)
Platelets: 228 10*3/uL (ref 150–400)
Platelets: 210 10*3/uL (ref 150–400)
RBC: 3.65 MIL/uL — ABNORMAL LOW (ref 3.87–5.11)
RDW: 17.3 % — ABNORMAL HIGH (ref 11.5–15.5)
WBC: 10 10*3/uL (ref 4.0–10.5)

## 2011-03-03 LAB — GC/CHLAMYDIA PROBE AMP, GENITAL: GC Probe Amp, Genital: NEGATIVE

## 2011-03-03 LAB — ACETAMINOPHEN LEVEL: Acetaminophen (Tylenol), Serum: <10 ug/mL — ABNORMAL LOW (ref 10–30)

## 2011-03-03 LAB — ETHANOL: Alcohol, Ethyl (B): <5 mg/dL (ref 0–10)

## 2011-03-04 LAB — CBC
HCT: 29.5 % — ABNORMAL LOW (ref 36.0–46.0)
Hemoglobin: 10 g/dL — ABNORMAL LOW (ref 12.0–15.0)
MCHC: 33.7 g/dL (ref 30.0–36.0)
MCV: 80 fL (ref 78.0–100.0)
Platelets: 275 10*3/uL (ref 150–400)
RBC: 4.11 MIL/uL (ref 3.87–5.11)
RBC: 3.62 MIL/uL — ABNORMAL LOW (ref 3.87–5.11)
RDW: 15.9 % — ABNORMAL HIGH (ref 11.5–15.5)
WBC: 9.2 10*3/uL (ref 4.0–10.5)

## 2011-03-04 LAB — URINE MICROSCOPIC-ADD ON

## 2011-03-04 LAB — COMPREHENSIVE METABOLIC PANEL
AST: 19 U/L (ref 0–37)
CO2: 23 mEq/L (ref 19–32)
Calcium: 8.8 mg/dL (ref 8.4–10.5)
Creatinine, Ser: 0.76 mg/dL (ref 0.4–1.2)
GFR calc Af Amer: >60 mL/min (ref >60)
GFR calc non Af Amer: >60 mL/min (ref >60)
Sodium: 138 mEq/L (ref 135–145)
Total Protein: 5.8 g/dL — ABNORMAL LOW (ref 6.0–8.3)

## 2011-03-04 LAB — DIFFERENTIAL
Eosinophils Relative: 1 % (ref 0–5)
Lymphocytes Relative: 25 % (ref 12–46)
Lymphs Abs: 2.3 10*3/uL (ref 0.7–4.0)
Monocytes Absolute: 1 10*3/uL (ref 0.1–1.0)
Monocytes Relative: 11 % (ref 3–12)

## 2011-03-04 LAB — POCT I-STAT, CHEM 8
BUN: 4 mg/dL — ABNORMAL LOW (ref 6–23)
Creatinine, Ser: 0.8 mg/dL (ref 0.4–1.2)
Sodium: 140 mEq/L (ref 135–145)
TCO2: 22 mmol/L (ref 0–100)

## 2011-03-04 LAB — URINALYSIS, ROUTINE W REFLEX MICROSCOPIC
Glucose, UA: NEGATIVE mg/dL
Hgb urine dipstick: NEGATIVE
Hgb urine dipstick: NEGATIVE
Ketones, ur: NEGATIVE mg/dL
Nitrite: NEGATIVE
Protein, ur: NEGATIVE mg/dL
Specific Gravity, Urine: 1.021 (ref 1.005–1.030)
Urobilinogen, UA: 0.2 mg/dL (ref 0.0–1.0)
pH: 6.5 (ref 5.0–8.0)

## 2011-03-04 LAB — DRUGS OF ABUSE SCREEN W/O ALC, ROUTINE URINE
Amphetamine Screen, Ur: NEGATIVE
Creatinine,U: 150.5 mg/dL
Marijuana Metabolite: POSITIVE — AB
Methadone: NEGATIVE
Opiate Screen, Urine: NEGATIVE
Propoxyphene: NEGATIVE

## 2011-03-04 LAB — POCT PREGNANCY, URINE: Preg Test, Ur: POSITIVE

## 2011-03-05 LAB — VALPROIC ACID LEVEL
Valproic Acid Lvl: 38.1 ug/mL — ABNORMAL LOW (ref 50.0–100.0)
Valproic Acid Lvl: 44 ug/mL — ABNORMAL LOW (ref 50.0–100.0)
Valproic Acid Lvl: 48.9 ug/mL — ABNORMAL LOW (ref 50.0–100.0)
Valproic Acid Lvl: 59.2 ug/mL (ref 50.0–100.0)
Valproic Acid Lvl: 63.8 ug/mL (ref 50.0–100.0)
Valproic Acid Lvl: 78.6 ug/mL (ref 50.0–100.0)

## 2011-03-05 LAB — CBC
HCT: 31.5 % — ABNORMAL LOW (ref 36.0–46.0)
HCT: 39 % (ref 36.0–46.0)
Hemoglobin: 10.3 g/dL — ABNORMAL LOW (ref 12.0–15.0)
Hemoglobin: 12.7 g/dL (ref 12.0–15.0)
MCV: 83.6 fL (ref 78.0–100.0)
Platelets: 250 10*3/uL (ref 150–400)
Platelets: 269 10*3/uL (ref 150–400)
RBC: 4.66 MIL/uL (ref 3.87–5.11)
RDW: 17 % — ABNORMAL HIGH (ref 11.5–15.5)
WBC: 10.8 10*3/uL — ABNORMAL HIGH (ref 4.0–10.5)
WBC: 9.9 10*3/uL (ref 4.0–10.5)
WBC: 10.8 10*3/uL — ABNORMAL HIGH (ref 4.0–10.5)

## 2011-03-05 LAB — COMPREHENSIVE METABOLIC PANEL
Albumin: 3.3 g/dL — ABNORMAL LOW (ref 3.5–5.2)
Alkaline Phosphatase: 75 U/L (ref 39–117)
BUN: 8 mg/dL (ref 6–23)
Chloride: 105 mEq/L (ref 96–112)
Glucose, Bld: 99 mg/dL (ref 70–99)
Potassium: 4 mEq/L (ref 3.5–5.1)
Total Bilirubin: 0.5 mg/dL (ref 0.3–1.2)

## 2011-03-05 LAB — POCT I-STAT, CHEM 8
BUN: 5 mg/dL — ABNORMAL LOW (ref 6–23)
Calcium, Ion: 1.12 mmol/L (ref 1.12–1.32)
Chloride: 107 mEq/L (ref 96–112)
HCT: 33 % — ABNORMAL LOW (ref 36.0–46.0)
HCT: 35 % — ABNORMAL LOW (ref 36.0–46.0)
Hemoglobin: 11.9 g/dL — ABNORMAL LOW (ref 12.0–15.0)
Potassium: 3.6 mEq/L (ref 3.5–5.1)
Sodium: 137 mEq/L (ref 135–145)
TCO2: 23 mmol/L (ref 0–100)

## 2011-03-05 LAB — BASIC METABOLIC PANEL
BUN: 4 mg/dL — ABNORMAL LOW (ref 6–23)
Calcium: 8.8 mg/dL (ref 8.4–10.5)
Calcium: 9 mg/dL (ref 8.4–10.5)
Chloride: 108 mEq/L (ref 96–112)
Creatinine, Ser: 0.69 mg/dL (ref 0.4–1.2)
GFR calc Af Amer: >60 mL/min (ref >60)
GFR calc Af Amer: >60 mL/min (ref >60)
GFR calc non Af Amer: >60 mL/min (ref >60)
GFR calc non Af Amer: >60 mL/min (ref >60)
Potassium: 3.5 mEq/L (ref 3.5–5.1)
Sodium: 137 mEq/L (ref 135–145)

## 2011-03-05 LAB — URINALYSIS, ROUTINE W REFLEX MICROSCOPIC
Bilirubin Urine: NEGATIVE
Glucose, UA: NEGATIVE mg/dL
Ketones, ur: NEGATIVE mg/dL
Leukocytes, UA: NEGATIVE
Nitrite: NEGATIVE
Protein, ur: NEGATIVE mg/dL
Protein, ur: NEGATIVE mg/dL
Specific Gravity, Urine: 1.008 (ref 1.005–1.030)
Specific Gravity, Urine: 1.017 (ref 1.005–1.030)
Urobilinogen, UA: 0.2 mg/dL (ref 0.0–1.0)
pH: 5.5 (ref 5.0–8.0)
pH: 7.5 (ref 5.0–8.0)

## 2011-03-05 LAB — DRUGS OF ABUSE SCREEN W/O ALC, ROUTINE URINE
Amphetamine Screen, Ur: NEGATIVE
Marijuana Metabolite: NEGATIVE
Propoxyphene: NEGATIVE

## 2011-03-05 LAB — DIFFERENTIAL
Basophils Absolute: 0.1 10*3/uL (ref 0.0–0.1)
Eosinophils Absolute: 0.1 10*3/uL (ref 0.0–0.7)
Eosinophils Relative: 2 % (ref 0–5)
Lymphocytes Relative: 31 % (ref 12–46)
Lymphs Abs: 3 10*3/uL (ref 0.7–4.0)
Lymphs Abs: 3.4 10*3/uL (ref 0.7–4.0)
Neutro Abs: 5.8 10*3/uL (ref 1.7–7.7)
Neutro Abs: 6.2 10*3/uL (ref 1.7–7.7)
Neutrophils Relative %: 58 % (ref 43–77)

## 2011-03-05 LAB — GASTRIC OCCULT BLOOD (1-CARD TO LAB)
Occult Blood, Gastric: POSITIVE — AB
pH, Gastric: 5

## 2011-03-05 LAB — PREGNANCY, URINE
Preg Test, Ur: NEGATIVE
Preg Test, Ur: NEGATIVE

## 2011-03-05 LAB — URINE MICROSCOPIC-ADD ON

## 2011-03-05 LAB — TSH: TSH: 1.977 u[IU]/mL (ref 0.350–4.500)

## 2011-03-05 LAB — RAPID URINE DRUG SCREEN, HOSP PERFORMED
Barbiturates: NOT DETECTED
Benzodiazepines: NOT DETECTED
Cocaine: NOT DETECTED
Opiates: NOT DETECTED
Tetrahydrocannabinol: NOT DETECTED

## 2011-03-05 LAB — POCT PREGNANCY, URINE: Preg Test, Ur: NEGATIVE

## 2011-03-05 LAB — ETHANOL
Alcohol, Ethyl (B): <5 mg/dL (ref 0–10)
Alcohol, Ethyl (B): <5 mg/dL (ref 0–10)

## 2011-04-07 NOTE — H&P (Signed)
[name], [name]:  [id number]   MEDICAL RECORD NO.:  [id number]          PATIENT TYPE:  IPS   LOCATION:  0305                          [facility]ACILITY:  [facility]   PHYSICIAN:  Anselm Jungling, MD  DATE OF BIRTH:  04/20/1986   DATE OF ADMISSION:  02/18/2009  DATE OF DISCHARGE:                       PSYCHIATRIC ADMISSION ASSESSMENT   This is a 25 year old single female voluntarily admitted on February 18, 2009.   HISTORY OF PRESENT ILLNESS:  The patient states she is here because she  is hearing voices again, voices telling her to kill herself.  She did  not harm herself but felt she needed to come in for safety purposes.  The patient was recently discharged from our facility on the 26th of  March and had a hospitalization prior 2 weeks prior to that and  discharged on the 26th.  She reports compliance with medication.  She  denies any specific stressors making her symptoms worse.  Sleeping well.  Appetite has been satisfactory.  Denies any alcohol or substance use.  The patient reports one of her reasons for admission is that she felt  she needed to be on the Seroquel on an as-needed basis to help out with  her anxiety and hallucinations that helped her feel more stable.   PAST PSYCHIATRIC HISTORY:  The patient has had multiple admissions to  our facility, again with a recent discharge on March 26th with another  visit approximately 11 days prior to that.  Sees Dr. Allyne Gee for  outpatient mental health services.   SOCIAL HISTORY:  The patient lives with her father and her 55-year-old  daughter.  Reports that it is a comfortable situation for her.  She is  on disability.  No legal problems.   FAMILY HISTORY:  Grandfather with history of bipolar disorder and father  history of depression and on medication and is unclear to what that  medication is.   ALCOHOL AND DRUG HISTORY:  The patient smokes.  She denies any alcohol  or drug use.   PRIMARY CARE Kienan Doublin:   Dr. Jeannetta Nap.   MEDICAL PROBLEMS:  History of pseudoseizures and GERD.   MEDICATIONS:  1. Neurontin 300 mg t.i.d.  2. Cymbalta 40 mg daily.  3. Abilify 30 mg at h.s.  4. Depakote ER 500 mg twice day.  5. Protonix 40 mg daily.   DRUG ALLERGIES:  PENICILLIN AND SULFA.   PHYSICAL EXAMINATION:  GENERAL:  This is a young female, fully assessed  at [facility] emergency apartment.  Her physical exam was reviewed with  no significant findings.  The patient is in the bed at this time.  Voices no complaints and appears in no distress.  VITAL SIGNS:  Temperature of 98.3, heart rate 104, respirations 18,  blood pressure is 150/101.  She is 5 feet 4 inches tall, 215 pounds.   Her laboratory data shows a pregnancy test is negative.  Urinalysis is  negative.  Hemoglobin of 11.2 and hematocrit of 33.   MENTAL STATUS EXAM:  The patient again is in the bed, sleepy, but  arouses readily and willing to answer  questions.  Her speech is soft-  spoken, normal pace.  Mood is depressed.  The patient is flat.  Thought  processes are coherent.  She denies any hallucinations at this time.  Denies any suicidal thoughts and contracts for safety.  Cognitive  function intact.  Her judgment seems to be fair.  Insight is minimal.   DISCHARGE DIAGNOSES:  AXIS I:  Bipolar disorder type 2.  AXIS II:  Deferred.  AXIS III:  Gastroesophageal reflux disease.  History of pseudoseizures.  AXIS IV:  Moderate stressors.  AXIS V:  Current is 30 to 35.   The plan is to continue her medications at this time.  Will continue to  continue identify the patient's stressors and other comorbidities.  Will  reinforce medication compliance and followup.  The patient will be in  the blue group.  The patient is to follow up with her appointment with  Meredith Leeds on April 5th for therapy.  Her tentative length of stay at  this time is 3 to 5 days.      Landry Corporal, N.P.      Anselm Jungling, MD  Electronically  Signed    JO/MEDQ  D:  02/19/2009  T:  02/19/2009  Job:  [id number]
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
## 2011-04-07 NOTE — Discharge Summary (Signed)
[name], [name]:  [id number]   MEDICAL RECORD NO.:  [id number]          PATIENT TYPE:  IPS   LOCATION:  0301                          [facility]ACILITY:  [facility]   PHYSICIAN:  Jasmine Pang, M.D. DATE OF BIRTH:  Mar 19, 1986   DATE OF ADMISSION:  03/09/2009  DATE OF DISCHARGE:  03/13/2009                               DISCHARGE SUMMARY   IDENTIFICATION:  This is a 25 year old divorced white female who was  admitted on a voluntary basis on March 09, 2009.   HISTORY OF PRESENT ILLNESS:  This is the patient's 6th admission since  January 22, 2009.  She presented as a walk-in on the day before admission,  reporting auditory hallucination.  She has a history of bipolar disorder  and states she had been feeling depressed.  For further admission  information, see psychiatric admission assessment.   PHYSICAL FINDINGS:  There were no acute physical or medical problems  noted.   ADMISSION LABORATORIES:  CBC with differential was remarkable for  slightly elevated WBC at 13 (4 to 10.5).  Hemoglobin and hematocrit were  decreased at 11.1 and 32.9.  RDW was high at 15.9 (11.5 to 15.5).  Comprehensive metabolic panel was remarkable for an elevated glucose of  143, BUN was low at 4 (6 to 23).  Bilirubin was low at 0.2, total  protein was low at 5.8, albumin was low at 3.3.  Depakote level was 49.2  (50 to 100).  UDS was positive for marijuana and otherwise negative.   HOSPITAL COURSE:  Upon admission, the patient was restarted on her home  medications of Depakote 500 mg p.o. b.i.d., Neurontin 300 mg t.i.d.,  Cymbalta 20 mg b.i.d., Protonix 40 mg daily, Abilify 20 mg daily, Haldol  5 mg p.o. t.i.d., Seroquel 50 mg p.o. t.i.d., and Ambien 10 mg p.o.  q.h.s. p.r.n. insomnia.  In individual sessions, the patient was  initially disheveled with absent eye contact.  There was psychomotor  retardation.  Speech was soft and slow.  She was lying in bed, very  sleepy when I talked with her.   She was drowsy and lethargic.  Mood was  depressed and anxious.  Affect depressed, constricted.  There was no  evidence of thought disorder.  No evidence of paranoia or delusions.  She was having hallucinations telling me to hurt myself.  On March 11, 2009, she continued to have psychomotor retardation with poor eye  contact.  Mood was anxious and depressed.  However, there was no  suicidal ideation and no auditory hallucinations.  On March 12, 2009,  the patient stated she felt less depressed and less anxious.  She wanted  to go home.  She had signed a request for discharge.  Her sleep was good  and appetite was good.  On March 13, 2009, mental status had improved  markedly from admission status.  She was going to groups.  Mood was  euthymic.  Affect was consistent with mood.  Psychomotor activity was  within normal limits.  She had good eye contact.  Speech, normal rate  and flow.  There was  no suicidal or homicidal ideation.  No thoughts of  self-injurious behavior.  No auditory or visual hallucinations.  No  paranoia or delusions.  Thoughts were logical and goal-directed.  Thought content no predominant theme.  Cognitive was grossly intact.  Insight fair.  Judgment fair.  Impulse control good.  She wanted to go  home today.  She wanted to see her little girl.  She was felt to be safe  for discharge.   DISCHARGE DIAGNOSES:  Axis I:  Bipolar disorder, not otherwise specified  with psychotic features, marijuana abuse.  Axis II:  Borderline personality disorder.  Axis III:  Headache, pseudoseizures.  Axis IV:  Moderate (chronic mental illness, medical problems).  Axis V:  Global assessment of functioning was 50 upon discharge.  GAF  was 25 upon admission.  GAF highest past year was 65.   DISCHARGE PLANS:  There was no specific activity level or dietary  restrictions.   POSTHOSPITAL CARE PLANS:  The patient will go to [facility] on  March 21, 2009, at 1 p.m.  She will also go  to [facility] for  counseling on March 18, 2009, at 2:00 p.m.   DISCHARGE MEDICATIONS:  1. Depakote tablets 500 mg twice daily.  2. Neurontin 300 mg 3 times daily.  3. Cymbalta 20 mg twice daily.  4. Protonix 40 mg daily.  5. Abilify 20 mg daily.  6. Haldol 5 mg p.o. t.i.d.  7. Seroquel 50 mg t.i.d.  8. Ambien 10 mg at bedtime if needed for sleep.      Jasmine Pang, M.D.  Electronically Signed     BHS/MEDQ  D:  03/13/2009  T:  03/14/2009  Job:  [id number]
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
## 2011-04-07 NOTE — Consult Note (Signed)
[name], [name]:  [id number]   MEDICAL RECORD NO.:  [id number]          PATIENT TYPE:  INP   LOCATION:  6710                         FACILITY:  [facility]   PHYSICIAN:  Antonietta Breach, M.D.  DATE OF BIRTH:  18-Nov-1986   DATE OF CONSULTATION:  DATE OF DISCHARGE:  12/29/2007                                 CONSULTATION   REQUESTING PHYSICIAN:  INCOMPASS A TEAM.   REASON FOR CONSULTATION:  Depression.   HISTORY OF PRESENT ILLNESS:  [name] is a 25 year old female admitted  to the [facility] on December 26, 2007 with a seizure.  The patient  had been having approximately 3 days of decreased energy.  She had some  difficulty concentrating.  She was having a number of episodes that  occurred over the week prior to admission.  These involved deja vu  experiences and auras, also the patient had some jerking movements.  She  had been taking her seizure medicine which consisted of Depakote and  Topamax.  The patient was also taking Effexor at 75 mg per day along  with BuSpar 15 mg twice daily.   At the time of the undersigned exam, she is not displaying any abnormal  physical behavior.  She remains alert.  She has intact orientation and  memory function.  She has constructive future goals and interests.  She  is not having any thoughts of harming herself or others.  She has no  delusions or hallucinations.   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  She was loaded with Depacon and titrated  to a maintenance dose of 500 mg q.a.m. and 1000 mg q.h.s.   The patient did state to the admitting physician that her overall  physical appearance was similar to a character who played Eligah East in  a movie.  The patient does not have any odd conception at the time of  examination.   PAST PSYCHIATRIC HISTORY:  In review of the past medical record, the  patient has anxiety and depression listed along with  bipolar disorder.  Her Effexor has been discontinued.  She does remain on the BuSpar 15 mg  b.i.d. as well as Depakote.   FAMILY PSYCHIATRIC HISTORY:  None known.   SOCIAL HISTORY:  [name] is single.  She does have a child.  She  lives in [place] with her boyfriend.  In terms of substance abuse she  has smoked marijuana in the past.  Drinks alcohol on occasion.  She is  unemployed.   PAST MEDICAL HISTORY:  She has a history of traumatic brain injury.  She  does have a history of seizures.   ALLERGIES:  SULFA AND PENICILLIN.   MEDICATIONS:  MAR is reviewed.  She is on BuSpar 15 mg b.i.d. and  Depakote as mentioned above.   A head CT without contrast on December 26, 2007 was normal.   LABORATORY DATA:  Depakote level 47.3.  Prolactin was 35.7, sodium 140,  BUN 6, creatinine 0.71, SGOT 18, SGPT 26.  WBC 10, hemoglobin 13.5, and  platelet count  206.   REVIEW OF SYSTEMS:  Constitutional, head, eyes, ears, nose, throat,  mouth, neurologic, psychiatric, cardiovascular, respiratory,  gastrointestinal, genitourinary, skin, musculoskeletal, hematologic,  lymphatic, endocrine, and metabolic all unremarkable.   PHYSICAL EXAMINATION:  VITAL SIGNS:  Temperature 98.8, pulse 84,  respiratory rate 20, blood pressure 99/63, and O2 saturation on room air  96%.  GENERAL APPEARANCE:  [name] is a young female reclining in a supine  position in her hospital bed with no abnormal involuntary movements.  She is socially appropriate.   MENTAL STATUS EXAM:  [name] is alert.  Her attention span is within  normal limits.  Her eye contact is normal.  She is oriented to all  spheres.  Her affect is slightly anxious.  Her mood is within normal  limits.  Her memory is intact to immediate, recent and remote, except  for some of the periods mentioned above.  Her speech involves normal  rate and prosody while without dysarthria.  Thought process is logical,  coherent, and goal-directed.  No  looseness of associations.  Thought  content:  No thoughts of harming herself.  No thoughts of harming  others.  No delusions.  No hallucinations.  Insight is good.  Judgment  is intact.   ASSESSMENT:  AXIS I:  294.9 unspecified persistent mental disorder, not  otherwise specified, rule out somatoform disorder, not otherwise  specified.   293.83 mood disorder, not otherwise specified.  The patient may have a  history of bipolar disorder.  She also has listed a history of  depression and her mood at this time is stable.  She does have general  medical factors including a history of traumatic brain injury.   293.84 anxiety disorder, not otherwise specified.   History of cannabis abuse.   AXIS II:  Deferred.  AXIS III:  See past medical history.  AXIS IV:  General medical.  AXIS V:  55.   [name] is not at risk to harm herself or others.  She agrees to call  emergency services immediately for any thoughts of harming herself or  other psychiatric emergency symptoms.   The undersigned provided ego supportive psychotherapy and education.  We  will proceed with her Depakote regimen.  She will also remain on her  BuSpar.   Outpatient psychiatric followup can be obtained at one of the clinics  attached to [facility] or [facility] in  addition to psychotropic medication management.  This patient could  benefit from a course of  psychotherapy with a therapist that has training and experience in  addressing somatoform disorders, if a somatoform process is present the  conditions of psychotherapy will relieve it, also the patient could use  psychotherapy to help her with mood and anxiety prevention.      Antonietta Breach, M.D.  Electronically Signed     JW/MEDQ  D:  04/11/2008  T:  04/12/2008  Job:  [id number]
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
## 2011-04-07 NOTE — Discharge Summary (Signed)
NAMEISLAH, EVE NO.:  [id number]   MEDICAL RECORD NO.:  [id number]          PATIENT TYPE:  IPS   LOCATION:  0600                          FACILITY:  [facility]   PHYSICIAN:  Jasmine Pang, M.D. DATE OF BIRTH:  20-Sep-1986   DATE OF ADMISSION:  05/29/2008  DATE OF DISCHARGE:  06/01/2008                               DISCHARGE SUMMARY   IDENTIFICATION:  This is a 25 year old divorced white female from  [place], [place].   HISTORY OF PRESENT ILLNESS:  The patient states I am hearing voices.  She states she hears voices telling her to hurt her sister.  She also  states she is thinking about killing herself.  She later stated I lied  saying that she made this things in order to get disability.  She  reports being under stress because she is a mother of a child and needs  some income.   PAST PSYCHIATRIC HISTORY:  The patient has been here several times in  the past. She sees Dr. Lolly Mustache for psychiatric medication management.  She also has a therapist.  She is currently on Depakote 1000 mg p.o.  b.i.d. and Effexor XR 150 mg daily.   FAMILY HISTORY:  Grandfather had bipolar disorder.   ALCOHOL AND DRUG HISTORY:  UDS was positive for marijuana.  She denies  excess substance use.   MEDICAL PROBLEMS:  Seizure disorder (pseudoseizures).   MEDICATIONS:  1. Depakote 1000 mg twice day.  2. Effexor 150 mg daily.  3. Prilosec 20 mg daily.   DRUG ALLERGIES:  PENICILLIN.   PHYSICAL FINDINGS:  There were no acute physical or medical problems  noted.  Her complete physical exam was done at [facility].   DIAGNOSTIC STUDIES:  Urinalysis was negative.  Urine pregnancy test  negative.  Valproic acid level was 48 (50-100).  WBCs were 14.   HOSPITAL COURSE:  Upon admission, the patient was started on trazodone  100 mg p.o. q.h.s. p.r.n. insomnia.  She was also started on Depakote  1000 mg in the morning and 1000 mg at h.s., Protonix 40 mg daily,  Risperdal  1 mg p.o. q.h.s., Effexor XR 150 mg daily, alprazolam 0.5 mg  p.o. q.6 hours p.r.n. anxiety, and nicotine gum as per smoking cessation  protocol. The patient tolerated these medications well with no  significant side effects.  In individual sessions with me, the patient  was reserved, but cooperative.  She stated she lied about hearing the  voices.  She denies any thoughts of harming herself.  She states she  lied in order to get her disability faster.  She does not feel she needs  to be here.  However, her father was very concerned.  He felt she was  depressed and was unclear as to why she would make up a story like this  in order to get into the hospital.  He did not want her discharge  quickly.  On May 31, 2008, she was less anxious, less depressed.  She  discussed breakup with a boyfriend the week before admission, which she  states was very distressing.  She also had cold symptoms including a  cough and some muscle aches.  She was having some trouble with middle of  the night awakening.  On June 01, 2008, mental status had improved  markedly from admission status.  The patient's father felt safe and  having her come home.  He had visit her the night before and stated she  was doing much better and her mood was less depressed, less anxious.  Affect, consistent with mood.  There was no suicidal or homicidal  ideation.  No thoughts of self-injurious behavior.  No auditory or  visual hallucinations.  No paranoia or delusions.  Thoughts were logical  and goal-directed.  Thought content, no predominant theme.  Cognitive  was grossly intact.  It was felt the patient was safe to be discharged  today.   DISCHARGE DIAGNOSES:  AXIS I:  Bipolar disorder, not otherwise specified  and tetrahydrocannabinol abuse.  Axis II:  Deferred.  Axis III:  Seizure disorder (pseudoseizures).  Axis IV:  Moderate (economic problem, other psychosocial problem,  medical problem).  Axis V:  Global assessment of  functioning was 50 upon discharge.  GAF  was 35 upon admission.  GAF highest past year was 60-65.   DISCHARGE PLANS:  There was no specific activity level or dietary  restrictions.   POSTHOSPITAL CARE PLANS:  The patient will be seen by Dr. Lolly Mustache at the  [facility] on June 06, 2008, at 1:45 p.m.  She  will also be seen by her therapist, Geanie Berlin on June 14, 2008, at  9 a.m.   DISCHARGE MEDICATIONS:  1. Depakote ER 1000 mg in the morning and 1000 mg at bedtime.  2. Risperdal 1 mg at bedtime.  3. Effexor XR 150 mg daily.  4. Trazodone 100 mg at bedtime as needed for insomnia.      Jasmine Pang, M.D.  Electronically Signed     BHS/MEDQ  D:  06/01/2008  T:  06/02/2008  Job:  [id number]

## 2011-04-07 NOTE — H&P (Signed)
[name], [name]:  [id number]   MEDICAL RECORD NO.:  [id number]          PATIENT TYPE:  IPS   LOCATION:  0401                          FACILITY:  [facility]   PHYSICIAN:  Anselm Jungling, MD  DATE OF BIRTH:  12-Oct-1986   DATE OF ADMISSION:  06/06/2008  DATE OF DISCHARGE:                       PSYCHIATRIC ADMISSION ASSESSMENT   DATE OF THE ASSESSMENT:  June 06, 2008 at 9:30 a.m.   IDENTIFICATION:  A 25 year old Caucasian female, single.  This is a  voluntary admission.   HISTORY OF PRESENT ILLNESS:  This is one of multiple admissions for this  25 year old who presented in the emergency room after she reported  taking about 20 tablets of her Klonopin 0.5 mg, because some voices told  her to do it.  Her urine was notably negative for benzodiazepines.  She  displayed some belligerent behavior in the emergency room and was  treated with 20 mg of the Geodon IM, after threatening to punch a nurse  in the face and ripping out her IVs.  She was placed in soft restraints  for a time, and was subsequently cooperative. To the counselor who was  assessing her, she reported that she had actually faked the overdose,  and was wanting to go home.  Today, she insists she is not suicidal; now  says that she did take some pills, and that they made her a little bit  drowsy; but she was not attempting to harm herself.  She is wanting to  go home to her 52-year-old daughter.  She reports that the trigger for  this episode was an argument with her boyfriend, and that they have  since broken up, and she will not be returning to him.  She denies any  suicidal intent today.   PAST PSYCHIATRIC HISTORY:  This is one of multiple psychiatric  admissions for this 25 year old who is been admitted to our facility  about every 3-4 weeks for the past 5 months.  Most recently discharged  from our service May 02, 2008.  After she admitted to the psychiatrist  that she had lied about her  symptoms in order to be admitted because she  was pursuing disability.  She had been scheduled to be followed by Dr.  Kathi Ludwig T. Arfeen, in the [facility] outpatient clinic in [place] for several  months, but had not kept follow up appointments.  She has been  previously diagnosed with a personality disorder and possibly bipolar  disorder.   SOCIAL HISTORY:  Single Caucasian female.  She has a young child at home  and is currently living with her father in [place], and denies any  current legal problems.   MEDICAL HISTORY:  Currently followed by Dr. Windle Guard in [facility].   CURRENT MEDICAL PROBLEMS:  None.   CURRENT MEDICATIONS:  1. Effexor XR 150 mg daily.  2. Prilosec 20 mg daily.  3. Risperdal 1 mg nightly.  4. Trazodone 100 mg nightly.  5. Depakote ER 500 mg b.i.d.   DRUG ALLERGIES:  PENICILLIN which causes a rash.   PHYSICAL EXAM:  Done in the  emergency room, noted in the record.   DIAGNOSTIC STUDIES:  Revealed a negative urine drug screen other tests  including a urine pregnancy test were negative.  Her valproic acid level  was 68.2.  Acetaminophen level was negative, as was her alcohol level  and salicylate level.   MENTAL STATUS EXAM:  Fully alert, oriented x4, coherent in a hospital  gown, a bit disheveled but fully engaged in conversation.  Speech  normal.  Mood neutral, thinks she does not need to be here, insisting  that she wants to leave, said that she was only making these statements  about an overdose because she wanted attention from her boyfriend.  Reports that she has been taking Klonopin from Dr. Lolly Mustache, but she has  not filled this prescription since more than a month ago.  No delusional  statements made, expressing concern about her child who is at home with  her father; admitting that she was wanting attention from her boyfriend,  no homicidal thoughts expressed, no evidence of psychosis.   IMPRESSION:  AXIS I:  Bipolar II  disorder.  AXIS II:  Borderline personality disorder.  AXIS III:  Gastroesophageal reflux disease by history.  AXIS IV:  1. Severe issues with relationship conflict.  2. Severe issues with social functioning.  AXIS V:  Current 40,  past year not known.   PLAN:  To admitted to our intensive care unit.  She is going to attend  group.  We are going to have another family session with her family, to  gauge the level of support, and hear their concerns.  Meanwhile we will  continue her regular medications.  In the meanwhile we will not continue  any Klonopin.  We will continue doses of other medications as noted on  her med record.      Margaret A. Lorin Picket, N.P.      Anselm Jungling, MD  Electronically Signed    MAS/MEDQ  D:  06/06/2008  T:  06/06/2008  Job:  [id number]

## 2011-04-07 NOTE — H&P (Signed)
[name], [name]:  [id number]   MEDICAL RECORD NO.:  [id number]          PATIENT TYPE:  IPS   LOCATION:  0506                          FACILITY:  [facility]   PHYSICIAN:  Jasmine Pang, M.D. DATE OF BIRTH:  [birth year]   DATE OF ADMISSION:  01/26/2009  DATE OF DISCHARGE:                       PSYCHIATRIC ADMISSION ASSESSMENT   This is a voluntary admission to the services of Dr. Milford Cage.   IDENTIFYING INFORMATION:  The patient was just discharged yesterday.  She is representing as a voluntary admission reporting that she is  depressed, having suicidal ideation, auditory hallucinations with  command to harm herself by overdosing.  She states I don't want to be  here anymore, I don't have any self-worth.  She sent a text message to  her stepmother asking for help.  The stepmother walked in to find the  patient with a handful of Depakote with the intention of overdosing.  She reportedly had already taken 3 tabs of Abilify 20 mg each and was  reporting auditory hallucinations with command to harm herself with  denigrating content.   PAST PSYCHIATRIC HISTORY:  She has been with [place] a number of times now,  the most recent admission being March 2nd in a similar presentation.  She has frequent issues of interpersonal conflict in her father's  household where she lives, either with her father's girlfriend or with  her 67 year old sister.  She does have a history for prior suicide  attempts.   SOCIAL HISTORY:  Unchanged.  She is living in her father's home along  with her father, her father's girlfriend, 9 year old sister, and 1-year-  old brother.  The patient herself has an 48-year-old daughter.  She is  unemployed.  She receives Medicaid.  She does not drive and remains  [place]ially [place]  She has no means  of transportation except for her father.  She is dependent on her father  for income and sustenance, and her daughter  attends a Primary school teacher  school.   MEDICAL HISTORY:  She is followed by Dr. Windle Guard, and she has  medical problems that include:  1. GERD.  2. Vaginal candidiasis.   CURRENT MEDICATIONS:  1. Depo-Provera injections.  2. Metronidazole cream 0.75% daily at bedtime.   DISCHARGE MEDICATIONS, MARCH 5TH:  1. Depakote-ER 500 mg b.i.d.  2. Gabapentin 300 mg t.i.d.  3. Cymbalta 20 mg b.i.d.  4. Protonix 40 mg daily.  5. Abilify 20 mg daily.  6. Metronidazole cream 0.75% daily at bedtime.   DRUG ALLERGIES:  1. PENICILLIN.  2. SULFA.   Physical exam was not repeated as she had just been discharged within 24  hours and was not reporting any symptoms.  Her vital signs on admission  show she is 5 feet 4, weighs 211, temperature is 98.3, blood pressure  155/81, respirations 18, and lab work was not repeated.   MENTAL STATUS EXAM:  She was drowsy.  She was seen in bed.  She  confirmed the admission information.  Her mood remains depressed.  She  states that she requested  admission so that she would not have a  pseudoseizure.  Judgment and insight are fair.  Concentration and memory  are intact.  She was not psychotic and is not actively suicidal.   DIAGNOSES:   AXIS I:  Mood disorder, not otherwise specified.   AXIS II:  Rule out personality disorder.   AXIS III:  Pseudoseizures.   AXIS IV:  Phase of life issues.   AXIS V:  Thirty.   The plan is to admit for safety and stabilization.  She was seen by Dr.  Lolly Mustache, who reported that the patient did not feel the Abilify was  helping so we will stop Abilify and restart Risperdal 1 mg at h.s.   ESTIMATED LENGTH OF STAY:  Three to 5 days.      Mickie Leonarda Salon, P.A.-C.      Jasmine Pang, M.D.  Electronically Signed    MD/MEDQ  D:  01/27/2009  T:  01/27/2009  Job:  [id number]

## 2011-04-07 NOTE — H&P (Signed)
[name], [name] NO.:  [id number]   MEDICAL RECORD NO.:  [id number]          PATIENT TYPE:  IPS   LOCATION:  0400                          FACILITY:  [facility]   PHYSICIAN:  Anselm Jungling, MD  DATE OF BIRTH:  02-25-1986   DATE OF ADMISSION:  02/09/2009  DATE OF DISCHARGE:                       PSYCHIATRIC ADMISSION ASSESSMENT   This is a voluntary admission to the services of Dr. Geralyn Flash.   IDENTIFYING INFORMATION:  [name] was just discharged on March 9th.  She presents with a chief complaint of suicidal ideation.  She states  that she is hearing voices, which are telling her to burn herself and/or  cut herself.  She states she has a plan to overdose on all her meds.  She stated she does not want to go to [facility].  She  wants to go someplace else.  She also reports that she has had 3 to 4  seizures today that were witnessed by a cousin.  The cousin is not here  to describe what she saw.  Patient is reportedly taking Depakote as  prescribed.  The suicidal ideation started at an unknown time ago and it  was precipitated by nothing.  The suicide plan is to overdose on her  prescribed meds, and the symptoms have been associated with a headache.   PAST PSYCHIATRIC HISTORY:  [name] has been here at the [facility] on March 2nd to March 5th, March 6th to March 9th, and now  again.  She has had other admissions as well, but this is just from  March.   SOCIAL HISTORY:  She lives in her father's home along with her father,  her father's girlfriend, 56 year old sister, and a 65-year-old brother.  The patient herself has an 27-year-old daughter.  She is unemployed.  She  does receive Medicaid.  She does not drive.  She has no means of  transportation except for her father.  She is dependent on him for  income and sustenance.  She reports she has applied for social security  disability but has been denied several times.  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   MEDICAL HISTORY:  She is followed by Dr. Windle Guard, and she has  medical problems that include:  1. GERD.  2. Vaginal candidiasis.   CURRENT MEDICATIONS:  I am sorry, I do not have her discharge summary,  but according to the intake she was discharged on:  1. Cymbalta 20 mg b.i.d.  2. Risperdal, dosage unknown.  3. Depakote-ER 500 mg b.i.d. by Dr. Katrinka Blazing.   FAMILY HISTORY:  Noncontributory.   ALCOHOL AND DRUG HISTORY:  She does not have one.   DRUG ALLERGIES:  1. PENICILLIN.  2. SULFA.   POSITIVE PHYSICAL FINDINGS:  She was medically cleared in the ED at  [facility].  Her Depakote level was low at 44.  Her urine pregnancy was  negative.  Her vital signs on admission show she is 62 inches tall, she  weighs 198.5 pounds, temperature is 97.8, blood pressure is 132/74,  pulse 91, respirations 18.  She last had a Depo-Provera shot on January 15, 2009.   MENTAL STATUS EXAM:  She was seen in bed.  She remains drowsy.  Her mood  she reports is depressed.  She states that she has not heard any voices  today.  Judgment and insight are fair.  Concentration and memory are  fair.  She was not suicidal or psychotic.   DIAGNOSES:   AXIS I:  Mood disorder, not otherwise specified.   AXIS II:  Rule out personality disorder.   AXIS III:  History for pseudoseizures.   AXIS IV:  Phase of life issues.   AXIS V:  Twenty-five.   The plan is to admit for safety and stabilization and to adjust her meds  as indicated.  Her admitting orders show that she was given Abilify 20  mg p.o. daily, Protonix 40 mg p.o. daily, and we will restart her  Depakote and check her levels.  The case manager should check for  discharge placement as she does not seem to be able to maintain any kind  of remission of symptoms while in her father's home.      [name], P.A.-C.      Anselm Jungling, MD  Electronically Signed    MD/MEDQ  D:  02/09/2009   T:  02/09/2009  Job:  [id number]

## 2011-04-07 NOTE — Discharge Summary (Signed)
NAMESNEHA, [name]:  [id number]   MEDICAL RECORD NO.:  [id number]          PATIENT TYPE:  IPS   LOCATION:  0506                          FACILITY:  [facility]   PHYSICIAN:  Geoffery Lyons, M.D.      DATE OF BIRTH:  Sep 19, 1986   DATE OF ADMISSION:  01/26/2009  DATE OF DISCHARGE:  01/29/2009                               DISCHARGE SUMMARY   CHIEF COMPLAINT AND PRESENT ILLNESS:  This was one of multiple  admissions to [facility] for this 25 year old female.  She was discharged just the day before, presenting as a voluntary  admission.  Reported that she is depressed, having thoughts of suicide,  auditory hallucinations, command to harm herself by overdosing, and  endorsed I don't want to be here anymore.  I don't have any self-  worth. She sent a text message to her stepmother asking for help.  The  stepmother walked in, finding her with a handful of Depakote with the  intention of overdosing.  She had already taken 3 tablets of Abilify 20,  was reporting auditory hallucinations with commands to harm herself and  denigrating content.   PAST PSYCHIATRIC HISTORY:  Multiple admissions, most recent January 22, 2009, a similar presentation.  Very interpersonal conflict in her  father's household where she lives.  Has a history of prior suicide  attempt.   ALCOHOL AND DRUG HISTORY:  Denies active use of any substances.   MEDICAL HISTORY:  Gastroesophageal reflux, vaginal Candida, uses  medication Depo-Provera, metronidazole cream.   SHE WAS DISCHARGED ON:  1. Depakote 500 mg twice daily.  2. Neurontin 300 mg 3 times daily.  3. Cymbalta 20 mg twice daily.  4. Protonix 40 mg per day.  5. Abilify 20 mg per day.  6. Metronidazole cream.   PHYSICAL EXAMINATION:  Failed to show any acute findings.   LABORATORY WORK:  None were repeated.   PHYSICAL EXAMINATION:  Reveals an alert cooperative female, somewhat  drowsy in bed.  Endorsed she was still  depressed.  Endorsed that she  requested admission so she will not have a pseudoseizure.  No active  delusions.  No active homicidal ideations.  No hallucinations.  Cognition well-preserved.   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  Axis II:  No diagnosis.  Axis III:  Pseudoseizures.  Axis IV:  Moderate.  Axis V:  On admission 35, highest GAF in the last year 60.   COURSE IN THE HOSPITAL:  She was admitted, started individual and group  psychotherapy.  She was maintained on her medications. She felt that  Abilify was not working.  Prior history of alcohol and marijuana.  She  claims sobriety.  Endorsed that when she went home she was still hearing  voices, the voices told her to do bad things, then to kill herself, went  home with the father, no job.  She apparently did the best on Risperdal,  so we went ahead and switched to Risperdal in lieu of the Abilify.  January 29, 2009  she was in full contact with reality, felt better.  Denied  any active suicidal or homicidal ideas, no hallucinations or delusions.  Felt that the combination of medication was agreeing with her and wanted  to be discharged to pursue outpatient followup.   DISCHARGE DIAGNOSES:  Axis I:  Bipolar disorder, psychotic features.  Axis II:  No diagnosis.  Axis III:  Pseudoseizures.  Axis IV:  Moderate.  Axis V:  Upon discharge 50-55.   DISCHARGED ON:  1. Depakote 500 twice daily.  2. Neurontin 300 mg 3 times daily.  3. Protonix 40 mg per day.  4. Cymbalta 20 mg 2 daily.  5. Risperdal 1 mg at bedtime.   FOLLOWUP:  Dr. __________ [facility].      Geoffery Lyons, M.D.  Electronically Signed     IL/MEDQ  D:  02/25/2009  T:  02/25/2009  Job:  [id number]
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
## 2011-04-07 NOTE — H&P (Signed)
NAMEROMY, IPOCK NO.:  [id number]   MEDICAL RECORD NO.:  [id number]          PATIENT TYPE:  IPS   LOCATION:  0301                          [facility]  [facility]   PHYSICIAN:  Jasmine Pang, M.D. DATE OF BIRTH:  Mar 12, 1986   DATE OF ADMISSION:  03/09/2009  DATE OF DISCHARGE:                       PSYCHIATRIC ADMISSION ASSESSMENT   This is a voluntary admission to the services of Dr. Milford Cage.   This is a 25 year old divorced white female.  This is her 6th admission  since January 22, 2009.  She presented here yesterday as a walk in  reporting auditory visual hallucinations.  She has a history for bipolar  disorder with multiple admissions.  She has been feeling increasingly  depressed for the past 2 weeks although she was just discharged on March 01, 2009.  She reports crying spells, hopelessness, and isolating.  She  reports hearing voices that are command in nature and she also reports  seeing a dead woman and a bald man.  She does not identify any unusual  stressors and says she is compliant with medications.  Her Depakote  level is subtherapeutic at 29.2.  We will call the pharmacy to verify  last pick up.   PAST PSYCHIATRIC HISTORY:  As already stated.  This is her 6th admission  since January 22, 2009.  She was here January 22, 2009, to January 25, 2009,  January 26, 2009, to January 29, 2009, February 09, 2009, to February 14, 2009,  February 18, 2009, to February 20, 2009, and then February 26, 2009, to March 01, 2009.  She has a history of overdosing and cutting in the past and she  does have access to harmful instruments.   SOCIAL HISTORY:  She is a divorced white female.  She has an 72-year-old  daughter.  She currently lives with her father and his new wife, her  stepmother.  She has had conflict and has had pseudoseizures regarding  this.   FAMILY HISTORY:  Grandfather with a history of bipolar disorder.  Her  father has depression and is on medication.   ALCOHOL AND DRUG  HISTORY:  She is known to abuse at least marijuana.  Her UD'S is currently pending.   PRIMARY CARE Koralyn Prestage:  Dr. Jeannetta Nap.   PSYCHIATRIST:  Dr. Allyne Gee at [facility].   MEDICATIONS:  She reports that she is currently on:  1. Depakote 500 mg p.o. b.i.d.  2. Neurontin 300 mg t.i.d.  3. Cymbalta 20 mg b.i.d.  4. Protonix 40 mg p.o. daily.  5. Abilify 20 mg p.o. daily.  6. Haldol 5 mg t.i.d.  7. Seroquel 50 mg t.i.d.   DRUG ALLERGIES:  1. PENICILLIN.  2. SULFA.   POSITIVE PHYSICAL FINDINGS:  As already stated, her UDS and UA are  pending.  She did not have any positives on review of systems.  Her  vital signs are not recorded yet so I cannot tell you what they are.   MENTAL STATUS EXAM:  Dr. Milford Cage had already examined the patient  in her room.  Per her usual, she  is in bed with her eyes closed.  She is  not reporting visual hallucinations this morning, just auditory  hallucinations.  She is appropriately groomed, dressed, and nourished.  Her speech was not pressured.  Her mood is depressed.  Her affect is  congruent.  The thought processes are not able to be assessed as she did  not respond enough.  Judgment and insight are fair.  Concentration and  memory are fair.  Intelligence is average.  She is reporting at least  passive suicidal ideation due to auditory hallucinations command in  nature to hurt herself and she has been having visual hallucinations per  her report.   AXIS I:  1. Bipolar, NOS, with psychotic features.  2. Auditory and visual hallucinations.  AXIS II:  Borderline personality disorder.  AXIS III:  1. Headaches.  2. Pseudoseizures.  AXIS IV:  Chronic mental illness.  AXIS V:  25.   PLAN:  Admit for safety and stabilization.  We will adjust her meds as  indicated.  It is suspected that she is not compliant or at least not  fully compliant with her medications.  Case managers will have to look  into placement because of all these  admissions.   ESTIMATED LENGTH OF STAY:  Three to 5 days.      Mickie Leonarda Salon, P.A.-C.      Jasmine Pang, M.D.  Electronically Signed    MD/MEDQ  D:  03/10/2009  T:  03/10/2009  Job:  [id number]
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
## 2011-04-07 NOTE — Procedures (Signed)
EEG NUMBER:   REFERRING PHYSICIAN:  [facility].   CLINICAL HISTORY:  A 24 year old being monitored for possible seizures.   MEDICATIONS LISTED:  BuSpar, Depakote, Topamax, Effexor, Ativan,  morphine, and Rozerem.   This was a routine EEG, recorded the patient awake and asleep using 17-  channel machine and standard 10/20 electrode placement.   Background awake rhythm is probably 8 to 9 Hz.  This is admixed with  significant high frequency low amplitude beta activity, which is seen  throughout the tracing.  Mild sleep stages are noted without any  significant abnormalities.  No paroxysmal epileptiform activity spikes  or sharp waves are seen.  Technical component of the study is average  with excessive movement artifacts noted.  Length of the tracing was 28.9  minutes.  Hyperventilation and photic stimulation are performed and both  are unremarkable.  Length of the tracing is 28.9 minutes.  The EKG  tracing reveals a regular sinus rhythm.   IMPRESSION:  This EEG performed during awake and light sleep states is  within normal limits.  No definite epileptiform features were noted.  If  seizures are strongly suspected, a repeat or sleep-deprived study may be  beneficial.           ______________________________  Sunny Schlein. Pearlean Brownie, MD     JYN:[id number]  D:  12/27/2007 20:38:17  T:  12/28/2007 14:51:30  Job #:  [id number]   cc:   [facility] Team
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
## 2011-04-07 NOTE — Discharge Summary (Signed)
NAMEJISELL, [name]:  [id number]   MEDICAL RECORD NO.:  [id number]          PATIENT TYPE:  IPS   LOCATION:  0405                          FACILITY:  [facility]   PHYSICIAN:  Anselm Jungling, MD  DATE OF BIRTH:  [birth year]   DATE OF ADMISSION:  02/09/2009  DATE OF DISCHARGE:  02/15/2009                               DISCHARGE SUMMARY   IDENTIFYING DATA AND REASON FOR ADMISSION:  This was an inpatient  psychiatric admission for [name], an unmarried 25 year old female, who  had recently been discharged from our inpatient program only 11 days  prior.  She re-presented with a chief complaint of suicidal ideation and  auditory hallucinations which were commanding her to cut or burn  herself.  Please refer to the admission note for further details  pertaining to the symptoms, circumstances, and history that led to her  hospitalization.  She was given an initial Axis I diagnosis of mood  disorder, NOS, and an Axis II designation of rule out personality  disorder, NOS.   MEDICAL AND LABORATORY:  The patient has a history of pseudoseizures and  other medical problems that include GERD and vaginal candidiasis.  She  was medically and physically assessed by the psychiatric nurse  practitioner and prescribed metronidazole vaginal gel for her vaginitis.  Protonix 40 mg daily was given for GERD.   Patient had some apparent pseudoseizures during her stay that appeared  to be triggered by stress and anxiety.  The undersigned had several  conversations with the patient about this phenomenon and it was  explained to the patient that she did not appear to have true epileptic  seizures but rather seizure-like events that were triggered by anxiety  and a manifestation of anxiety.  It was made clear to the patient that  she was not truly a candidate for anticonvulsive medication therapy.  This was acceptable to the patient.   HOSPITAL COURSE:  The patient was admitted to the adult  inpatient  psychiatric service.  She presented as a moderately obese but otherwise  normally developed and well-nourished adult female who in the initial  interview was guarded, had poor eye contact, and slow but coherent  speech.  She endorsed suicidal ideation with a plan to overdose and  auditory hallucinations.  She demonstrated thought blocking and very  limited insight and judgment.   The patient was treated with a psychotropic regimen that included  Depakote, Cymbalta, Neurontin, and Abilify.  These were all well  tolerated.   The patient was initially evaluated by Dr. Lolly Mustache.  The undersigned  assumed her care on the 4th hospital day.  At that time, the patient was  pleasant and relaxed and was reporting no further auditory  hallucinations either that day or the day prior.  Her thoughts appeared  well organized.  She appeared to be a good candidate for a move to the  open inpatient unit.  This was tried but the patient immediately began  to make gestures of breaking plastic spoons with threats to stab herself  with pencils and cut herself with the broken plastic.  She was moved  back to the closed psychiatric unit and placed on one-to-one observation  for approximately 48 hours.  She made no overt attempts to harm herself  and after 48 hours, indicated that she was no longer having such urges  and she wanted to progress and participate more normally in the  treatment program.  She was progressed stepwise and continued on her  medication regimen.   By the 6th hospital day, the patient denied suicidal ideation or urges  to harm herself.  She was brighter and appeared truly motivated to  participate and progress in the program.  She was agreeable to a family  session which occurred on the final hospital day.  Following this, the  patient was discharged home with her family.  She agreed to the  following aftercare plan.   AFTERCARE:  The patient was to follow up with [facility] with an appointment to see their psychiatrist on February 21, 2009,  at 2 p.m., and in addition an appointment to see [name] on February 25, 2009, at 1 p.m.   DISCHARGE MEDICATIONS:  1. Depakote 1000 mg b.i.d.  2. Cymbalta 40 mg daily.  3. Neurontin 300 mg t.i.d.  4. Metronidazole cream daily.  5. Protonix 40 mg daily.  6. Abilify 30 mg q.h.s.   DISCHARGE DIAGNOSES:  AXIS I:  Bipolar disorder, type 2.  AXIS II:  Deferred.  AXIS III:  Vaginal candidiasis, gastroesophageal reflux disease, history  of pseudoseizures.  AXIS IV:  Stressors, severe.  AXIS V:  Global Assessment of Functioning on discharge 55.   The patient was also referred to a community support team which she was  agreeable to working with.      Anselm Jungling, MD  Electronically Signed     SPB/MEDQ  D:  02/15/2009  T:  02/15/2009  Job:  [id number]

## 2011-04-07 NOTE — Discharge Summary (Signed)
NAMEJAMESA, TEDRICK NO.:  [id number]   MEDICAL RECORD NO.:  [id number]          PATIENT TYPE:  IPS   LOCATION:  0305                          [facility]ACILITY:  [facility]   PHYSICIAN:  Jasmine Pang, M.D. DATE OF BIRTH:  1986-10-15   DATE OF ADMISSION:  09/24/2008  DATE OF DISCHARGE:  09/29/2008                               DISCHARGE SUMMARY   IDENTIFICATION:  This is a 25 year old single Caucasian female who was  admitted on a voluntary basis on September 24, 2008.   HISTORY OF PRESENT ILLNESS:  The patient presents with complaint of  auditory hallucinations with commands to kill her sister lasted for  week.  She is also had 2 days of command hallucinations to kill her  self.  She has had 2 days of command hallucinations recently to kill  herself.  There is been recent arguing and fighting with her aunt.  She  was evicted from her apartment.  She has been overwhelmed by the stress.  She is endorsing use of marijuana.   PAST PSYCHIATRIC HISTORY:  The patient's outpatient doctor was Dr.  Lolly Mustache in the [facility] in  [place].  The last [facility] admission for the patient was on July 15  through 16, 2009 also July 7 through 10, 2009.  She had multiple [facility]  admission.  She has mood disorder, NOS, and features of borderline  personality disorder.   MEDICAL PROBLEMS:  The patient has a seizure disorder, last with September 22, 2008, she has been characterized pseudoseizures by her doctors.  She  also has headaches, NOS.   MEDICATIONS:  See hospital course.   DRUG ALLERGIES:  PENICILLIN causes convulsions.   PHYSICAL FINDINGS:  Physical exam was performed.  There were no acute  physical or medical problems noted.   ADMISSION LABORATORIES:  WBC was 11.8, hemoglobin was 11.4, hematocrit  35.2, and platelets 245.  Sodium was 139, potassium 3.7, chloride 107,  bicarbonate 22, BUN was 5, and creatinine was 0.73 and her calcium was  9.2.  Her  glucose was 90 and then TSH was normal at 2.686.   HOSPITAL COURSE:  Upon admission, the patient was continued on her home  medications of Risperdal 1 mg p.o. q.h.s., trazodone 100 mg p.o. q.h.s.,  Depakote 1000 mg p.o. b.i.d., Klonopin 0.5 mg t.i.d., and hydrocodone  5/525 mg 1 p.o. q.4-6 h. p.r.n. pain, and Effexor XR 225 mg daily.  She  was also started on Protonix 40 mg p.o. b.i.d. and Abilify 10 mg p.o.  q.h.s.  The Risperdal was discontinued.  In individual sessions with me  the patient was friendly and cooperative.  She was pleasant, but sad.  She states she wants to get help.  She denies active suicidal  ideation.  No symptoms of psychosis.  She stated that she has been  really depressed and this was continuing during the hospitalization.  Affect was very tearful.  She was DBT as an outpatient.  She states she  now sees Dr. Allyne Gee at the [facility] instead  of Dr. Lolly Mustache.  She currently sees Dr. Allyne Gee at the [facility].  On 09/27/2008.  She was somewhat less depressed.  She was still anxious.  Her Klonopin was discontinued and Ativan 0.5 mg  p.o. t.i.d., it was begun because she felt the Klonopin was not helpful.  On September 28, 2008, mood was less depressed and less anxious.  She has  been talking with her father who is supportive.  He was planning to  visit today.  She was still having some trouble sleeping.  On September 29, 2008, mental status had improved markedly from admission status.  Sleep was good.  Appetite was good.  Mood was euthymic.  Affect  consistent with mood.  There was no suicidal or homicidal ideation.  No  thoughts of self-injurious behavior.  No auditory or visual  hallucinations.  No paranoia or delusions.  Thoughts were logical and  goal-directed.  Thought content, no predominant theme.  Cognitive was  grossly intact.  Insight fair.  Judgment good.  Impulse control good.  She felt ready for discharge  today.  Her father felt comfortable with  her going home.  She feels the medications changes have been helpful.   DISCHARGE DIAGNOSES:  Axis I:  Bipolar disorder, not otherwise  specified.  Axis II:  Features of borderline personality disorder.  Axis III: Headaches, not otherwise specified and pseudoseizures.  Axis IV: Moderate (problems with primary support group, burden of  psychiatric illness).  Axis V: Global assessment of functioning upon discharge was 50.  GAF  upon admission was 40.  GAF highest past year was 60-65.   DISCHARGE PLANS:  There are no specific activity level or dietary  restriction.   POSTHOSPITAL CARE PLAN:  The patient will be go to the [facility]  to see Dr. Lang Snow on November 17, at 9:30 a.m.  She will go to family  services for counseling.   DISCHARGE MEDICATIONS:  Venlafaxine 225 mg daily.  The Risperdal was to  be stopped Klonopin is to be stopped.  Ativan 0.5 mg p.o. t.i.d.,  Abilify 10 mg p.o. q.h.s., trazodone 100 mg. p.o. q.h.s., Depakote 1000  mg p.o. b.i.d., Protonix 40 mg daily, and hydrocodone APAP take as  directed by her primary care physician.      Jasmine Pang, M.D.  Electronically Signed     BHS/MEDQ  D:  09/29/2008  T:  09/30/2008  Job:  [id number]
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
## 2011-04-10 NOTE — Procedures (Signed)
EEG NUMBER:   HISTORY:  This is a 25 year old with seizures versus pseudoseizures.   PROCEDURE:  This is a prolonged video EEG monitoring.   TECHNICAL DESCRIPTION:  Throughout this prolonged video EEG monitoring on  February 12, 2005, the recording is from 12:23 p.m. to 3:58 p.m., throughout  this time the patient does not have any push-button events.  The patient  accidentally pushed the push-button once as she meant to call the nurse.  There are no events recorded throughout this recording.  The background of  this tracing is symmetric mostly comprised of alpha range activity at 20-30  microvolts.  Throughout the background there is a tremendous amount of EMG  movement and occasional electrode artifact that occasionally obscures the  background. Throughout this recording, there is no evidence of  electrographic seizures or interictal discharge activity.   IMPRESSION:  This prolonged video EEG monitoring displayed no evidence of  electrographic seizures or interictal discharge activity.  There are no  typical push-button events recorded during this time frame.      Bevelyn Buckles. Nash Shearer, M.D.  Electronically Signed     ZOX:WRUE  D:  02/13/2006 12:24:08  T:  02/15/2006 18:51:15  Job #:  [id number]

## 2011-04-10 NOTE — H&P (Signed)
NAMEMarland Kitchen  HERMA, UBALLE NO.:  [id number]   MEDICAL RECORD NO.:  [id number]          PATIENT TYPE:  IPS   LOCATION:  0304                          [facility]ACILITY[facility]  [facility]   PHYSICIAN:  Jeanice Lim, M.D. DATE OF BIRTH:  [birth year]   DATE OF ADMISSION:  02/14/2006  DATE OF DISCHARGE:  02/17/2006                         PSYCHIATRIC ADMISSION ASSESSMENT   IDENTIFYING INFORMATION:  This is a 25 year old separated white female  voluntarily admitted on February 14, 2006.   HISTORY OF PRESENT ILLNESS:  The patient presents with a history of  depressive symptoms and had made a statement to providers at the hospital  about not wanting to live with her ongoing seizure history.  A psychiatric  consult was ordered and felt patient needed to be admitted for assessment.  The patient reports having some depressive symptoms.  She states that was  never any intent to harm herself.  She denies any psychotic symptoms.   PAST PSYCHIATRIC HISTORY:  The patient was hospitalized at the [facility] in 2003.  In the past, has been on Effexor and Ativan.   SOCIAL HISTORY:  This is a 25 year old separated white female.  Has a 5-year-  old child.  She lives with her father.  She is attempting to get disability.   FAMILY HISTORY:  None.   ALCOHOL/DRUG HISTORY:  The patient smokes.  Denies any alcohol or drug use.   PRIMARY CARE PHYSICIAN:  [facility].   MEDICAL PROBLEMS:  Seizure disorder with her last seizure Friday prior to  this admission.  Has been having seizures since 2003.  Reports that her  seizures are indicated as tonic-clonic type activity.   MEDICATIONS:  Has been on Topamax 200 mg twice a day, Protonix 40 mg daily,  Depakote 500 mg twice a day, BuSpar 10 mg twice a day, Flagyl taking it  three times a day, Effexor XR 225 mg which has been held during the hospital  stay, Vicodin 5/500 mg, 1 every six hours.   ALLERGIES:  PENICILLIN.   PHYSICAL EXAMINATION:  The  patient was assessed at [facility].  This is a young, healthy-appearing female.  Temperature 97.2,  heart rate 80, respirations 16, blood pressure 101/58, weight 148 pounds.  This is a young female, healthy-appearing, in no acute distress.   REVIEW OF SYSTEMS:  No fever, chills, positive for insomnia, positive for  loss of appetite.  No weight loss.  No nausea and vomiting.  Positive for  seizures.  No headaches.  No falls.  No blurred vision.  Positive for  depression.   LABORATORY DATA:  Hemoglobin 11.7, hematocrit 34.7, potassium 3.4.  Prolactin level was 27.  Alcohol level less than 5.  Urinalysis is negative.  TSH is 1.415.  __________ level is 12.   MENTAL STATUS EXAM:  Sleepy, young female in the bed with seizure pads  applied.  The patient's responses are few words.  The patient feels very  tired.  The patient appears very sleepy.  The patient's responses seem to be  very coherent, goal directed.  Cognitive function intact.  Memory  is fair.  Judgment and insight are fair.  She is a poor historian.   DIAGNOSES:  AXIS I:  Depressive disorder not otherwise specified.  AXIS II:  Deferred.  AXIS III:  Seizure disorder.  AXIS IV:  Other psychosocial problems, medical problems.  AXIS V:  Current 35.   PLAN:  Contract for safety.  Stabilize mood and thinking.  The patient is to  be placed on seizure precautions.  Will monitor Depakote level.  We will  decrease Topamax.  Will have Risperdal available at bedtime.  Consider a  family session with her father for support and any further education.  The  patient is to follow up with her specialist as needed.   TENTATIVE LENGTH OF STAY:  Four days.      Landry Corporal, N.P.      Jeanice Lim, M.D.  Electronically Signed    JO/MEDQ  D:  02/17/2006  T:  02/18/2006  Job:  [id number]
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
## 2011-04-10 NOTE — Discharge Summary (Signed)
NAMEMarland Kitchen  SHARNETTE, KITAMURA NO.:  [id number]   MEDICAL RECORD NO.:  [id number]          PATIENT TYPE:  IPS   LOCATION:  0306                          [facility]ILITY[facility]   PHYSICIAN:  Jasmine Pang, M.D. DATE OF BIRTH:  Jun 26, 1986   DATE OF ADMISSION:  02/26/2009  DATE OF DISCHARGE:  03/01/2009                               DISCHARGE SUMMARY   IDENTIFICATION:  This is a 25 year old divorced white female from  [place] who was admitted on a voluntary basis due to psychosis.   HISTORY OF PRESENT ILLNESS:  The patient states she has been hearing  voices that tell her to kill herself.  She denies suicidal or homicidal  ideation.  She was carrying a soft animal wrapped in a blanket that she  stated was a baby.  She states she has also been seeing a Ambulance person.  For  further admission information, see psychiatric admission assessment.   PHYSICAL FINDINGS:  There were no acute physical or medical problems  noted.  She was fully assessed in the [facility] ED.   ADMISSION LABORATORIES:  Urine pregnancy test was negative.  Urinalysis  was negative.  Hemoglobin was 10.5 and hematocrit was 31.   HOSPITAL COURSE:  Upon admission, the patient was started on Neurontin  600 mg p.o. t.i.d. and Cymbalta 40 mg p.o. q. day, Abilify 30 mg p.o.  q.h.s., Depakote ER 500 mg p.o. b.i.d.,  Protonix 40 mg daily, and  Seroquel 50 mg t.i.d.  These were all her home medications.  On February 26, 2009, Neurontin was decreased to 200 mg p.o. t.i.d.  Due to a vaginal  infection, she was started on Flagyl 2 g x1.  In individual sessions  with me, the patient was reserved, but cooperative.  She was dressed  casually with fair right contact.  There was psychomotor retardation.  Speech was soft and slow.  Mood was depressed and anxious.  Affect was  consistent with mood.  There was no evidence of a thought disorder or  psychosis.  On February 27, 2009, the patient continued to complain of  depression and anxiety.  She denied any auditory hallucinations.  We had  talked about going to assisted living that she found out she could not  go there for financial reasons.  On February 28, 2009, the patient was less  depressed and less anxious.  She felt much better.  She was looking  forward to a family session with her father.  Her Seroquel was  discontinued and she was started on Haldol 5 mg p.o. t.i.d. due to the  expense of the Seroquel.  On March 01, 2009, the patient had a family  session with her father over the phone.  The patient's father reported  that he saw the plan advised by the social worker was a good one.  The  patient needed to make sure she could also to get more exercise.  The  patient stated she felt she could follow up the wellness plan and a  counselor emphasized the importance of following the wellness plan in  order to make any  improvements including being compliant with her  medications after discharge and with her follow up appointment.  On  March 01, 2009, after the family session, the patient was eager to go  home.  She was felt to be safe for discharge.  She felt her family  session with the father went well.  She stated he was going to try to  help her get through her appointments.  The mood was less depressed,  less anxious.  Affect consistent with mood.  There was no suicidal or  homicidal ideation.  No thoughts of self-injurious behavior.  No  auditory or visual hallucinations.  No paranoia or delusions.  Thoughts  were logical and goal-directed.  Thought content, no predominant theme.  Cognitive was grossly intact.  Insight poor.  Judgment fair.  Impulse  control fair.  It was felt the patient was safe for discharge today.   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  Axis IV: Moderate (problems with primary support group, burden of  psychiatric illness, burden of pseudoseizures.)  Axis V: Global assessment of functioning was 50 upon discharge.  GAF was  30 upon admission.  GAF highest past year was 60-65.   DISCHARGE PLANS:  There was no specific activity level or dietary  restriction.   POSTHOSPITAL CARE PLANS:  The patient will go to the [facility] on  March 07, 2009 at 3 o'clock p.m.  She will also follow up with family  solution for counseling with Ellin Goodie on March 06, 2009 at 11  o'clock a.m.   DISCHARGE MEDICATIONS:  1. Cymbalta 40 mg daily.  2. Abilify 30 mg at bedtime.  3. Depakote ER 500 mg twice a day.  4. Protonix 40 mg daily.  5. Neurontin 200 mg 3 times a day.  6. Haldol 5 mg 3 times a day.   She is to stop taking the Seroquel.      Jasmine Pang, M.D.  Electronically Signed     BHS/MEDQ  D:  03/15/2009  T:  03/16/2009  Job:  [id number]
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
## 2011-04-10 NOTE — Discharge Summary (Signed)
NAMECORRENA, MEACHAM NO.:  [id number]   MEDICAL RECORD NO.:  [id number]          PATIENT TYPE:  IPS   LOCATION:  0304                          FACILITY:  [facility]   PHYSICIAN:  Jeanice Lim, M.D. DATE OF BIRTH:  09/05/1986   DATE OF ADMISSION:  02/14/2006  DATE OF DISCHARGE:  02/17/2006                                 DISCHARGE SUMMARY   IDENTIFYING DATA:  This is a 25 year old separated Caucasian female  voluntarily admitted.  Presenting with a history of depressive symptoms.  Had made a statement to providers of the hospital about not wanting to live  with her ongoing seizure activity, feeling that she has no control and this  scares family as well as father is afraid that she may go off and hurt  someone or hurt herself if she is to go to work.  Therefore, she is unable  to work, feeling out of control and initially complaining of seizure-like  episodes which may have been evaluated and considered non-EEG related  seizure activity or pseudoseizures.  After further discussion, also identify  significant mood instability and feeling at times suicidal and desperate.   PAST PSYCHIATRIC HISTORY:  Hospitalized at the [facility] in  2003.  Had been on Effexor and Ativan.   PRIMARY CARE PHYSICIAN:  [facility].   MEDICAL PROBLEMS:  Seizure disorder.  Last seizure Friday prior to  admission.  Had seizures in the emergency room and had been evaluated.   ALLERGIES:  PENICILLIN.   PHYSICAL EXAMINATION:  Physical and neurologic exam within normal limits.   REVIEW OF SYSTEMS:  Noncontributory except positive for seizures and  depression.   LABORATORY DATA:  Routine admission labs essentially within normal limits.  Prolactin level 27.  Alcohol level less than 5.  Urinalysis negative.  TSH  within normal limits.   MENTAL STATUS EXAM:  Sleepy young female in bed with seizure pads in place.  Short-answered responses, feels very tired, appearing  sleepy.  Responses  appeared to be goal directed, organized.  Cognitively intact.  Judgment and  insight was impaired and patient was a poor historian.   ADMISSION DIAGNOSES:  AXIS I:  Depressive disorder not otherwise specified.  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  AXIS II:  Deferred.  AXIS III:  Seizure disorder by history, unclear regarding type.  AXIS IV:  Psychosocial problems, medical problems.  AXIS V:  35/55.   HOSPITAL COURSE:  The patient was admitted and ordered routine p.r.n.  medications and underwent further monitoring.  Was encouraged to participate  in individual, group and milieu therapy.  The patient was monitored  regarding Depakote level and Topamax was decreased.  Risperdal available at  bedtime and family session was considered regarding further support and  education regarding the type of specialty follow-up and monitoring as well  as to gain further collateral information to increase database regarding  condition.  The patient was adjusted on medications, reported a positive  response, tolerating medication changes, feeling much better after  medications were adjusted.  Mood much more stable,  feeling more in control,  had no episode after further stabilization of medications.   CONDITION ON DISCHARGE:  She was discharged in improved condition with no  dangerous ideation, no psychotic symptoms.  Mood was euthymic.  Affect  brighter.  The patient felt a sense of control and hope since medications  were helping her mood and she felt much more in control of emotions, less  reactive, less panic, anxiety and sleeping and eating well.  The patient was  given medication education review again at the time of discharge.   DISCHARGE MEDICATIONS:  1.  Ambien 10 mg q.h.s. p.r.n.  2.  Vicodin 5/500 every six hours p.r.n. pain, to take as previously      prescribed.  3.  Protonix 40 mg q.a.m.  4.  BuSpar 10 mg b.i.d.  5.   Topamax 100 mg, 1-1/2 q.a.m. and 1-1/2 at 8 p.m.  6.  Depakote 500 mg, 1 in the morning and 2 at 8 p.m.  Optimized for      stabilization of mood.   The patient may be able to further decrease Topamax and possibly come off it  if she can become therapeutic on Depakote and mood remains stable and  episodes decrease.   1.  Risperdal 0.5 mg at bedtime.   FOLLOW UP:  The patient is to follow up with [facility] with Dr. Allyne Gee on Monday, February 22, 2006 at 8:30.   DISCHARGE DIAGNOSES:  AXIS I:  Depressive disorder not otherwise specified.  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  AXIS II:  Deferred.  AXIS III:  Seizure disorder by history, unclear regarding type.  AXIS IV:  Psychosocial problems, medical problems.  AXIS V:  GAF on discharge 55.   Condition was improved.  No safety issues at the time of discharge.      Jeanice Lim, M.D.  Electronically Signed     JEM/MEDQ  D:  02/24/2006  T:  02/26/2006  Job:  [id number]
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
## 2011-06-08 ENCOUNTER — Inpatient Hospital Stay (HOSPITAL_COMMUNITY)
Admission: AD | Admit: 2011-06-08 | Discharge: 2011-06-08 | Disposition: A | Discharge status: Home / Self Care | Payer: Self-pay | Source: Ambulatory Visit | Attending: Obstetrics and Gynecology | Admitting: Obstetrics and Gynecology

## 2011-06-08 ENCOUNTER — Encounter (HOSPITAL_COMMUNITY): Payer: Self-pay | Admitting: *Deleted

## 2011-06-08 DIAGNOSIS — J039 Acute tonsillitis, unspecified: Secondary | ICD-10-CM

## 2011-06-08 DIAGNOSIS — N912 Amenorrhea, unspecified: Secondary | ICD-10-CM | POA: Insufficient documentation

## 2011-06-08 DIAGNOSIS — J069 Acute upper respiratory infection, unspecified: Secondary | ICD-10-CM | POA: Insufficient documentation

## 2011-06-08 LAB — URINALYSIS, ROUTINE W REFLEX MICROSCOPIC
Bilirubin Urine: NEGATIVE
Ketones, ur: NEGATIVE mg/dL
Nitrite: NEGATIVE
pH: 5.5 (ref 5.0–8.0)

## 2011-06-08 LAB — URINE MICROSCOPIC-ADD ON

## 2011-06-08 MED ORDER — AZITHROMYCIN 250 MG PO TABS: ORAL_TABLET | ORAL | Status: AC

## 2011-06-08 MED ORDER — OXYCODONE-ACETAMINOPHEN 5-325 MG PO TABS: 1.0000 | ORAL_TABLET | ORAL | Status: AC | PRN

## 2011-06-08 NOTE — ED Provider Notes (Addendum)
History Past few days patient states she has had sore throat and gland swelling. Wanted to get it checked before it got bad. Had to have tonsillar abscess I&D in past. Also request pregnancy test because she has not had a period in a few months.     Chief Complaint  Patient presents with  . URI   The history is provided by the patient.    No past medical history on file.  No past surgical history on file.  No family history on file.  History  Substance Use Topics  . Smoking status: Not on file  . Smokeless tobacco: Not on file  . Alcohol Use: Not on file    OB History    No data available      Review of Systems  Constitutional: Positive for fever. Negative for chills, diaphoresis and fatigue.  HENT: Positive for ear pain and congestion. Negative for sore throat, facial swelling, neck pain, neck stiffness, dental problem and sinus pressure.   Eyes: Negative for photophobia, pain and discharge.  Respiratory: Positive for cough. Negative for chest tightness and wheezing.   Gastrointestinal: Negative for nausea, vomiting, abdominal pain, diarrhea, constipation and abdominal distention.  Genitourinary: Negative for dysuria, frequency, flank pain, vaginal bleeding and difficulty urinating.  Musculoskeletal: Negative for myalgias, back pain and gait problem.  Skin: Negative for color change and rash.  Neurological: Negative for dizziness, speech difficulty, weakness, light-headedness, numbness and headaches.  Psychiatric/Behavioral: Negative for confusion and agitation.    Physical Exam  BP 129/79  Pulse 83  Temp(Src) 98.5 F (36.9 C) (Oral)  Resp 16  Ht 5\' 3"  (1.6 m)  Wt 195 lb 9.6 oz (88.724 kg)  BMI 34.65 kg/m2  SpO2 98%  Physical Exam  Nursing note and vitals reviewed. Constitutional: She is oriented to person, place, and time. Vital signs are normal. She appears well-developed and well-nourished.  HENT:  Head: Normocephalic and atraumatic.  Right Ear: External  ear and ear canal normal. Tympanic membrane is erythematous and bulging. Tympanic membrane is not perforated.  Left Ear: External ear and ear canal normal. Tympanic membrane is not perforated, not erythematous and not bulging.  Mouth/Throat: Posterior oropharyngeal erythema present.       Right tonsil swollen with exudate, uvula midline, no difficulty swallowing.   Eyes: EOM are normal. Pupils are equal, round, and reactive to light.  Neck: Neck supple.  Cardiovascular: Normal rate and regular rhythm.   Pulmonary/Chest: No respiratory distress. She has no wheezes.  Abdominal: Soft. There is no tenderness.  Musculoskeletal: Normal range of motion. She exhibits no edema.  Lymphadenopathy:    She has cervical adenopathy.  Neurological: She is alert and oriented to person, place, and time. No cranial nerve deficit.  Skin: Skin is warm and dry.  Psychiatric: She has a normal mood and affect.    ED Course  Procedures  MDM Will treat patient with z-pak for infection and percocet for pain. Discussed negative pregnancy test and pt. Does not want pelvic exam. She will follow up with her PCP.

## 2011-06-08 NOTE — Progress Notes (Signed)
Pt c/o sinus congestion, pressure and H/A x5 days.Productive cough green mucous, SOB with movement. No fever or chills.  Pt did not know if she was pregnant wanted a pregnancy test.

## 2011-06-08 NOTE — Progress Notes (Signed)
Pt states she thinks she might be pregnant and has had URI sumptoms for several days, ears are hurting.

## 2011-08-12 LAB — I-STAT 8, (EC8 V) (CONVERTED LAB)
Glucose, Bld: 80
Potassium: 3.9
TCO2: 20
pH, Ven: 7.356 — ABNORMAL HIGH

## 2011-08-14 LAB — DIFFERENTIAL
Basophils Absolute: 0
Eosinophils Absolute: 0.2
Eosinophils Relative: 2

## 2011-08-14 LAB — CBC
MCV: 93.1
RBC: 4.17
WBC: 10

## 2011-08-14 LAB — COMPREHENSIVE METABOLIC PANEL
ALT: 26
AST: 18
Alkaline Phosphatase: 60
CO2: 26
Chloride: 109
Creatinine, Ser: 0.71
GFR calc Af Amer: >60
GFR calc non Af Amer: >60
Potassium: 4.1
Sodium: 140
Total Bilirubin: 0.5

## 2011-08-14 LAB — VALPROIC ACID LEVEL: Valproic Acid Lvl: 55.7

## 2011-08-17 LAB — RAPID URINE DRUG SCREEN, HOSP PERFORMED
Amphetamines: NOT DETECTED
Tetrahydrocannabinol: NOT DETECTED

## 2011-08-17 LAB — DIFFERENTIAL
Basophils Relative: 0
Lymphs Abs: 2.9
Monocytes Absolute: 0.8
Monocytes Relative: 9
Neutro Abs: 4.5

## 2011-08-17 LAB — ETHANOL: Alcohol, Ethyl (B): <5

## 2011-08-17 LAB — COMPREHENSIVE METABOLIC PANEL
Albumin: 3.6
Alkaline Phosphatase: 58
BUN: 10
Calcium: 8.7
Potassium: 3.4 — ABNORMAL LOW
Sodium: 138
Total Protein: 6.1

## 2011-08-17 LAB — VALPROIC ACID LEVEL: Valproic Acid Lvl: 69.6

## 2011-08-17 LAB — CBC
HCT: 38.1
MCHC: 34.5
Platelets: 176
RDW: 13.7

## 2011-08-17 LAB — ACETAMINOPHEN LEVEL: Acetaminophen (Tylenol), Serum: <10 — ABNORMAL LOW

## 2011-08-18 LAB — PHENYTOIN LEVEL, TOTAL: Phenytoin Lvl: <2.5 — ABNORMAL LOW

## 2011-08-18 LAB — POCT I-STAT, CHEM 8
BUN: 8
Calcium, Ion: 1.2
Chloride: 104
Chloride: 107
Creatinine, Ser: 0.8
Glucose, Bld: 82
Glucose, Bld: 91
HCT: 39
HCT: 40
Hemoglobin: 13.3
Hemoglobin: 13.6
Potassium: 3.7
Potassium: 3.8
Sodium: 139
Sodium: 140
TCO2: 26

## 2011-08-18 LAB — URINALYSIS, ROUTINE W REFLEX MICROSCOPIC
Bilirubin Urine: NEGATIVE
Bilirubin Urine: NEGATIVE
Glucose, UA: NEGATIVE
Glucose, UA: NEGATIVE
Hgb urine dipstick: NEGATIVE
Hgb urine dipstick: NEGATIVE
Ketones, ur: NEGATIVE
Ketones, ur: NEGATIVE
Nitrite: NEGATIVE
Nitrite: NEGATIVE
Specific Gravity, Urine: 1.011
Specific Gravity, Urine: 1.012
Specific Gravity, Urine: 1.018
Urobilinogen, UA: 0.2
pH: 5
pH: 7

## 2011-08-18 LAB — BASIC METABOLIC PANEL
BUN: 10
CO2: 18 — ABNORMAL LOW
CO2: 22
Calcium: 8.7
Calcium: 8.9
Chloride: 107
Creatinine, Ser: 0.82
Creatinine, Ser: 0.83
GFR calc Af Amer: >60
GFR calc Af Amer: >60
GFR calc non Af Amer: >60
GFR calc non Af Amer: >60
Glucose, Bld: 92
Potassium: 3.5
Sodium: 135

## 2011-08-18 LAB — CBC
HCT: 36.9
HCT: 38.8
Hemoglobin: 13.4
MCHC: 34.3
MCHC: 34.5
MCHC: 35.4
MCV: 94.5
MCV: 94.6
MCV: 94.8
Platelets: 208
Platelets: 217
Platelets: 239
RBC: 4.09
RBC: 4.11
RDW: 13.4
RDW: 13.4
WBC: 8.2
WBC: 8.7

## 2011-08-18 LAB — DIFFERENTIAL
Basophils Absolute: 0
Basophils Absolute: 0
Basophils Relative: 0
Basophils Relative: 0
Basophils Relative: 0
Eosinophils Absolute: 0.1
Eosinophils Absolute: 0.1
Eosinophils Relative: 1
Lymphocytes Relative: 31
Lymphocytes Relative: 36
Lymphs Abs: 3.1
Monocytes Absolute: 0.8
Monocytes Absolute: 0.9
Monocytes Relative: 9
Neutro Abs: 4.6
Neutro Abs: 4.6
Neutro Abs: 6.1
Neutrophils Relative %: 54
Neutrophils Relative %: 57
Neutrophils Relative %: 57

## 2011-08-18 LAB — RAPID URINE DRUG SCREEN, HOSP PERFORMED
Amphetamines: NOT DETECTED
Amphetamines: NOT DETECTED
Barbiturates: NOT DETECTED
Barbiturates: NOT DETECTED
Barbiturates: NOT DETECTED
Benzodiazepines: POSITIVE — AB
Benzodiazepines: POSITIVE — AB
Cocaine: NOT DETECTED
Cocaine: NOT DETECTED
Opiates: NOT DETECTED
Opiates: NOT DETECTED
Opiates: NOT DETECTED
Tetrahydrocannabinol: NOT DETECTED
Tetrahydrocannabinol: NOT DETECTED

## 2011-08-18 LAB — VALPROIC ACID LEVEL
Valproic Acid Lvl: 61.8
Valproic Acid Lvl: 66.4
Valproic Acid Lvl: 75.3
Valproic Acid Lvl: <10 — ABNORMAL LOW

## 2011-08-18 LAB — ETHANOL
Alcohol, Ethyl (B): <5
Alcohol, Ethyl (B): <5

## 2011-08-19 LAB — VALPROIC ACID LEVEL
Valproic Acid Lvl: 70.3
Valproic Acid Lvl: 79.5

## 2011-08-19 LAB — COMPREHENSIVE METABOLIC PANEL
Alkaline Phosphatase: 62
BUN: 2 — ABNORMAL LOW
Calcium: 8.3 — ABNORMAL LOW
Creatinine, Ser: 0.66
Glucose, Bld: 96
Potassium: 3.6
Total Protein: 5.3 — ABNORMAL LOW

## 2011-08-19 LAB — RAPID URINE DRUG SCREEN, HOSP PERFORMED
Amphetamines: NOT DETECTED
Cocaine: NOT DETECTED
Opiates: NOT DETECTED
Tetrahydrocannabinol: NOT DETECTED

## 2011-08-19 LAB — DIFFERENTIAL
Basophils Absolute: 0
Basophils Relative: 0
Lymphocytes Relative: 30
Monocytes Relative: 8
Neutro Abs: 6.1
Neutrophils Relative %: 60

## 2011-08-19 LAB — CBC
HCT: 35.9 — ABNORMAL LOW
Hemoglobin: 12.4
MCHC: 34.6
MCV: 94.9
RDW: 13.1

## 2011-08-19 LAB — URINALYSIS, ROUTINE W REFLEX MICROSCOPIC
Bilirubin Urine: NEGATIVE
Ketones, ur: NEGATIVE
Nitrite: NEGATIVE
Specific Gravity, Urine: 1.013
Urobilinogen, UA: 0.2

## 2011-08-19 LAB — MAGNESIUM: Magnesium: 1.8

## 2011-08-20 LAB — COMPREHENSIVE METABOLIC PANEL
ALT: 15
AST: 21
Albumin: 3.3 — ABNORMAL LOW
Alkaline Phosphatase: 55
BUN: 4 — ABNORMAL LOW
CO2: 29
Calcium: 8.7
Chloride: 100
Creatinine, Ser: 0.64
GFR calc Af Amer: >60
GFR calc non Af Amer: >60
Glucose, Bld: 94
Potassium: 4.1
Sodium: 136
Total Bilirubin: 0.6
Total Protein: 5.7 — ABNORMAL LOW

## 2011-08-20 LAB — DIFFERENTIAL
Basophils Absolute: 0.1
Basophils Absolute: 0.1
Basophils Absolute: 0.1
Basophils Relative: 1
Basophils Relative: 0
Eosinophils Absolute: 0.2
Eosinophils Absolute: 0.3
Eosinophils Absolute: 0.3
Eosinophils Relative: 2
Eosinophils Relative: 2
Lymphocytes Relative: 28
Lymphs Abs: 2.3
Lymphs Abs: 3
Lymphs Abs: 3.6
Monocytes Absolute: 0.7
Monocytes Relative: 8
Neutro Abs: 5.1
Neutrophils Relative %: 61
Neutrophils Relative %: 63

## 2011-08-20 LAB — CBC
HCT: 35.1 — ABNORMAL LOW
HCT: 38.5
Hemoglobin: 12.4
MCHC: 35.3
MCV: 93.2
MCV: 94.3
MCV: 95.2
Platelets: 135 — ABNORMAL LOW
Platelets: 224
Platelets: 217
RBC: 3.77 — ABNORMAL LOW
RDW: 12.8
RDW: 14
RDW: 14
WBC: 8.3
WBC: 12.1 — ABNORMAL HIGH

## 2011-08-20 LAB — TRICYCLICS SCREEN, URINE: TCA Scrn: NOT DETECTED

## 2011-08-20 LAB — BASIC METABOLIC PANEL
BUN: 5 — ABNORMAL LOW
BUN: 5 — ABNORMAL LOW
Chloride: 103
Chloride: 106
Creatinine, Ser: 0.62
Glucose, Bld: 84
Glucose, Bld: 113 — ABNORMAL HIGH
Potassium: 3.5

## 2011-08-20 LAB — URINALYSIS, ROUTINE W REFLEX MICROSCOPIC
Bilirubin Urine: NEGATIVE
Bilirubin Urine: NEGATIVE
Glucose, UA: NEGATIVE
Hgb urine dipstick: NEGATIVE
Ketones, ur: NEGATIVE
Nitrite: NEGATIVE
Protein, ur: NEGATIVE
Specific Gravity, Urine: 1.005
Specific Gravity, Urine: 1.01
Urobilinogen, UA: 0.2
Urobilinogen, UA: 0.2
pH: 7.5

## 2011-08-20 LAB — ACETAMINOPHEN LEVEL
Acetaminophen (Tylenol), Serum: <10 — ABNORMAL LOW
Acetaminophen (Tylenol), Serum: <10 — ABNORMAL LOW

## 2011-08-20 LAB — SALICYLATE LEVEL: Salicylate Lvl: <4

## 2011-08-20 LAB — RAPID URINE DRUG SCREEN, HOSP PERFORMED
Amphetamines: NOT DETECTED
Amphetamines: NOT DETECTED
Barbiturates: NOT DETECTED
Benzodiazepines: NOT DETECTED
Cocaine: NOT DETECTED
Opiates: NOT DETECTED
Tetrahydrocannabinol: NOT DETECTED

## 2011-08-20 LAB — VALPROIC ACID LEVEL
Valproic Acid Lvl: 68.2
Valproic Acid Lvl: 48 — ABNORMAL LOW

## 2011-08-20 LAB — ETHANOL: Alcohol, Ethyl (B): <5

## 2011-08-20 LAB — PREGNANCY, URINE: Preg Test, Ur: NEGATIVE

## 2011-08-24 LAB — RAPID URINE DRUG SCREEN, HOSP PERFORMED
Benzodiazepines: POSITIVE — AB
Cocaine: NOT DETECTED
Opiates: NOT DETECTED
Tetrahydrocannabinol: NOT DETECTED

## 2011-08-24 LAB — CBC
HCT: 38.3
MCHC: 33.5
MCV: 91.1
Platelets: 281
RDW: 15.4
WBC: 10

## 2011-08-24 LAB — COMPREHENSIVE METABOLIC PANEL
AST: 24
Albumin: 3.8
BUN: 3 — ABNORMAL LOW
Calcium: 9.1
Chloride: 107
Creatinine, Ser: 0.71
GFR calc Af Amer: >60
Total Protein: 6.5

## 2011-08-24 LAB — DIFFERENTIAL
Basophils Absolute: 0
Eosinophils Relative: 1
Lymphocytes Relative: 37
Lymphs Abs: 3.8
Monocytes Absolute: 0.8
Monocytes Relative: 8
Neutro Abs: 5.4

## 2011-08-25 LAB — COMPREHENSIVE METABOLIC PANEL WITH GFR
ALT: 23
ALT: 14
AST: 24
AST: 17
Albumin: 3.7
Albumin: 3.5
Alkaline Phosphatase: 72
Alkaline Phosphatase: 71
BUN: 3 — ABNORMAL LOW
BUN: 2 — ABNORMAL LOW
CO2: 22
CO2: 25
Calcium: 8.9
Calcium: 8.7
Chloride: 109
Chloride: 108
Creatinine, Ser: 0.73
Creatinine, Ser: 0.67
GFR calc non Af Amer: >60
GFR calc non Af Amer: >60
Glucose, Bld: 93
Glucose, Bld: 84
Potassium: 3.6
Potassium: 3.6
Sodium: 138
Sodium: 139
Total Bilirubin: 0.6
Total Bilirubin: 0.4
Total Protein: 6.2
Total Protein: 6.2

## 2011-08-25 LAB — RAPID URINE DRUG SCREEN, HOSP PERFORMED
Amphetamines: NOT DETECTED
Amphetamines: NOT DETECTED
Barbiturates: NOT DETECTED
Barbiturates: NOT DETECTED
Benzodiazepines: NOT DETECTED
Benzodiazepines: POSITIVE — AB
Cocaine: NOT DETECTED
Opiates: NOT DETECTED
Tetrahydrocannabinol: NOT DETECTED
Tetrahydrocannabinol: NOT DETECTED

## 2011-08-25 LAB — URINALYSIS, ROUTINE W REFLEX MICROSCOPIC
Bilirubin Urine: NEGATIVE
Bilirubin Urine: NEGATIVE
Bilirubin Urine: NEGATIVE
Glucose, UA: NEGATIVE
Glucose, UA: NEGATIVE
Glucose, UA: NEGATIVE
Hgb urine dipstick: NEGATIVE
Ketones, ur: NEGATIVE
Ketones, ur: NEGATIVE
Ketones, ur: NEGATIVE
Leukocytes, UA: NEGATIVE
Nitrite: NEGATIVE
Nitrite: NEGATIVE
Nitrite: NEGATIVE
Protein, ur: NEGATIVE
Protein, ur: NEGATIVE
Protein, ur: NEGATIVE
Protein, ur: NEGATIVE
Specific Gravity, Urine: 1.005
Specific Gravity, Urine: 1.006
Urobilinogen, UA: 0.2
Urobilinogen, UA: 0.2
Urobilinogen, UA: 0.2
Urobilinogen, UA: 0.2
pH: 7.5
pH: 6

## 2011-08-25 LAB — ETHANOL

## 2011-08-25 LAB — PREGNANCY, URINE: Preg Test, Ur: NEGATIVE

## 2011-08-25 LAB — LIPASE, BLOOD: Lipase: 20

## 2011-08-25 LAB — WET PREP, GENITAL
Clue Cells Wet Prep HPF POC: NONE SEEN
Trich, Wet Prep: NONE SEEN
Yeast Wet Prep HPF POC: NONE SEEN

## 2011-08-25 LAB — ACETAMINOPHEN LEVEL

## 2011-08-25 LAB — POCT PREGNANCY, URINE: Preg Test, Ur: NEGATIVE

## 2011-08-25 LAB — URINE MICROSCOPIC-ADD ON

## 2011-08-25 LAB — VALPROIC ACID LEVEL: Valproic Acid Lvl: <10 — ABNORMAL LOW

## 2011-08-26 LAB — BASIC METABOLIC PANEL
CO2: 24
Calcium: 9
Creatinine, Ser: 0.71
GFR calc Af Amer: >60

## 2011-08-26 LAB — DIFFERENTIAL
Basophils Absolute: 0
Basophils Relative: 0
Monocytes Relative: 10
Neutro Abs: 7.1
Neutrophils Relative %: 56

## 2011-08-26 LAB — PREGNANCY, URINE
Preg Test, Ur: NEGATIVE
Preg Test, Ur: NEGATIVE

## 2011-08-26 LAB — OPIATE, QUANTITATIVE, URINE
Morphine, Confirm: NEGATIVE ng/mL
Oxycodone, ur: 910 ng/mL
Oxymorphone: 260 ng/mL

## 2011-08-26 LAB — URINALYSIS, ROUTINE W REFLEX MICROSCOPIC
Glucose, UA: NEGATIVE
Glucose, UA: NEGATIVE
Hgb urine dipstick: NEGATIVE
Protein, ur: NEGATIVE
Protein, ur: NEGATIVE
Specific Gravity, Urine: 1.006
Urobilinogen, UA: 0.2
Urobilinogen, UA: 0.2
pH: 6

## 2011-08-26 LAB — CBC
HCT: 35.2 — ABNORMAL LOW
MCHC: 33.2
MCV: 91
Platelets: 223
RBC: 3.87
RBC: 3.88
RDW: 15.9 — ABNORMAL HIGH
WBC: 11.4 — ABNORMAL HIGH

## 2011-08-26 LAB — VALPROIC ACID LEVEL
Valproic Acid Lvl: 79.2
Valproic Acid Lvl: 43.5 — ABNORMAL LOW

## 2011-08-26 LAB — COMPREHENSIVE METABOLIC PANEL
AST: 16
BUN: 5 — ABNORMAL LOW
CO2: 22
Chloride: 107
Creatinine, Ser: 0.73
GFR calc non Af Amer: >60
Total Bilirubin: 0.4

## 2011-08-26 LAB — DRUGS OF ABUSE SCREEN W/O ALC, ROUTINE URINE
Amphetamine Screen, Ur: NEGATIVE
Benzodiazepines.: NEGATIVE
Marijuana Metabolite: NEGATIVE

## 2011-08-26 LAB — RAPID URINE DRUG SCREEN, HOSP PERFORMED: Barbiturates: NOT DETECTED

## 2011-08-31 LAB — DIFFERENTIAL
Basophils Absolute: 0
Basophils Relative: 0
Eosinophils Absolute: 0.2
Eosinophils Relative: 1
Monocytes Absolute: 0.8

## 2011-08-31 LAB — COMPREHENSIVE METABOLIC PANEL
ALT: 13
AST: 16
Albumin: 3.7
Alkaline Phosphatase: 64
CO2: 21
Chloride: 112
Creatinine, Ser: 0.77
GFR calc Af Amer: >60
GFR calc non Af Amer: >60
Potassium: 3.6
Sodium: 141
Total Bilirubin: 0.4

## 2011-08-31 LAB — GC/CHLAMYDIA PROBE AMP, GENITAL: GC Probe Amp, Genital: NEGATIVE

## 2011-08-31 LAB — URINALYSIS, ROUTINE W REFLEX MICROSCOPIC
Hgb urine dipstick: NEGATIVE
Ketones, ur: NEGATIVE
Protein, ur: NEGATIVE
Urobilinogen, UA: 0.2

## 2011-08-31 LAB — VALPROIC ACID LEVEL: Valproic Acid Lvl: 57

## 2011-08-31 LAB — CBC
MCV: 93.2
Platelets: 197
RBC: 4.49
WBC: 12.5 — ABNORMAL HIGH

## 2011-08-31 LAB — RPR: RPR Ser Ql: NONREACTIVE

## 2011-08-31 LAB — WET PREP, GENITAL: Yeast Wet Prep HPF POC: NONE SEEN

## 2011-10-04 ENCOUNTER — Emergency Department (HOSPITAL_COMMUNITY)
Admission: EM | Admit: 2011-10-04 | Discharge: 2011-10-04 | Disposition: A | Discharge status: Home / Self Care | Payer: Medicaid Other | Attending: Emergency Medicine | Admitting: Emergency Medicine

## 2011-10-04 ENCOUNTER — Encounter (HOSPITAL_COMMUNITY): Payer: Self-pay

## 2011-10-04 ENCOUNTER — Emergency Department (HOSPITAL_COMMUNITY): Payer: Medicaid Other

## 2011-10-04 DIAGNOSIS — R1031 Right lower quadrant pain: Secondary | ICD-10-CM | POA: Insufficient documentation

## 2011-10-04 DIAGNOSIS — F319 Bipolar disorder, unspecified: Secondary | ICD-10-CM | POA: Insufficient documentation

## 2011-10-04 DIAGNOSIS — O26899 Other specified pregnancy related conditions, unspecified trimester: Secondary | ICD-10-CM

## 2011-10-04 DIAGNOSIS — O99891 Other specified diseases and conditions complicating pregnancy: Secondary | ICD-10-CM | POA: Insufficient documentation

## 2011-10-04 DIAGNOSIS — F17200 Nicotine dependence, unspecified, uncomplicated: Secondary | ICD-10-CM | POA: Insufficient documentation

## 2011-10-04 DIAGNOSIS — M549 Dorsalgia, unspecified: Secondary | ICD-10-CM | POA: Insufficient documentation

## 2011-10-04 HISTORY — DX: Bipolar disorder, unspecified: F31.9

## 2011-10-04 HISTORY — DX: Depression, unspecified: F32.A

## 2011-10-04 HISTORY — DX: Major depressive disorder, single episode, unspecified: F32.9

## 2011-10-04 HISTORY — DX: Unspecified convulsions: R56.9

## 2011-10-04 HISTORY — DX: Anxiety disorder, unspecified: F41.9

## 2011-10-04 HISTORY — DX: Attention-deficit hyperactivity disorder, unspecified type: F90.9

## 2011-10-04 LAB — CBC
HCT: 39.7 % (ref 36.0–46.0)
MCV: 88 fL (ref 78.0–100.0)
RBC: 4.51 MIL/uL (ref 3.87–5.11)
RDW: 15.1 % (ref 11.5–15.5)
WBC: 16.6 10*3/uL — ABNORMAL HIGH (ref 4.0–10.5)

## 2011-10-04 LAB — BASIC METABOLIC PANEL
CO2: 23 mEq/L (ref 19–32)
Calcium: 9.5 mg/dL (ref 8.4–10.5)
Creatinine, Ser: 0.68 mg/dL (ref 0.50–1.10)
Glucose, Bld: 85 mg/dL (ref 70–99)

## 2011-10-04 LAB — DIFFERENTIAL
Eosinophils Relative: 1 % (ref 0–5)
Lymphocytes Relative: 19 % (ref 12–46)
Lymphs Abs: 3.1 10*3/uL (ref 0.7–4.0)
Monocytes Absolute: 1.1 10*3/uL — ABNORMAL HIGH (ref 0.1–1.0)

## 2011-10-04 LAB — URINALYSIS, ROUTINE W REFLEX MICROSCOPIC
Bilirubin Urine: NEGATIVE
Hgb urine dipstick: NEGATIVE
Ketones, ur: NEGATIVE mg/dL
Nitrite: NEGATIVE
Specific Gravity, Urine: 1.025 (ref 1.005–1.030)
Urobilinogen, UA: 0.2 mg/dL (ref 0.0–1.0)

## 2011-10-04 MED ORDER — PRENATAL RX 60-1 MG PO TABS: 1.0000 | ORAL_TABLET | Freq: Every day | ORAL | Status: DC

## 2011-10-04 MED ORDER — SODIUM CHLORIDE 0.9 % IV SOLN
999.0000 mL | INTRAVENOUS | Status: DC
  Administered 2011-10-04: 1000 mL via INTRAVENOUS

## 2011-10-04 NOTE — ED Notes (Signed)
Dr. Bebe Shaggy in to speak with patient regarding discharge and follow-up with OB.  IV d/c'd; catheter intact and site WNL.

## 2011-10-04 NOTE — ED Provider Notes (Signed)
Assumed care at 10pm [place] did not show definitive IUP Pt improved, no abd tenderness She will f/u with obgyn within 48 hours we discussed strict return precautions Pt agreeable Ibupfrofen/nyquil were discontinued  Joya Gaskins, MD 10/04/11 2257

## 2011-10-04 NOTE — ED Provider Notes (Signed)
History     CSN: [id number] Arrival date & time: 10/04/2011  8:18 PM   First MD Initiated Contact with Patient 10/04/11 2032      Chief Complaint  Patient presents with  . Back Pain  . Abdominal Pain    (Consider location/radiation/quality/duration/timing/severity/associated sxs/prior treatment) HPI The patient is G3 P1 with one elective AB who presents to the emergency room with lower abdominal pain. Patient states she took a home pregnancy test that was positive. Her last menstrual period was the end of September. Patient states the pain is sharp and intermittent right lower quadrant. It is not particularly increased with eating or movement. She has had some back pain as well but that's been in her upper back. Between her shoulder blades. The patient denies any shortness of breath, fevers, vomiting, diarrhea, vaginal bleeding. The severity is moderate. Past Medical History  Diagnosis Date  . Seizures   . Anxiety   . Depression   . Bipolar 1 disorder   . ADHD (attention deficit hyperactivity disorder)     History reviewed. No pertinent past surgical history.  History reviewed. No pertinent family history.  History  Substance Use Topics  . Smoking status: Current Everyday Smoker -- 20.0 packs/day for 10 years    Types: Cigarettes  . Smokeless tobacco: Not on file  . Alcohol Use: No    OB History    Grav Para Term Preterm Abortions TAB SAB Ect Mult Living   4 1 1  1 1    1       Review of Systems  All other systems reviewed and are negative.    Allergies  Iohexol; Penicillins; and Sulfa antibiotics  Home Medications   Current Outpatient Rx  Name Route Sig Dispense Refill  . IBUPROFEN 200 MG PO TABS Oral Take 800 mg by mouth daily as needed.      Marland Kitchen OMEPRAZOLE 40 MG PO CPDR Oral Take 40 mg by mouth daily.      . NYQUIL PO Oral Take 2 capsules by mouth daily as needed. Patient took medication for cough, congestion.       BP 121/85  Pulse 98  Temp(Src) 98.6  F (37 C) (Oral)  Resp 18  Ht 5\' 3"  (1.6 m)  Wt 186 lb 9.6 oz (84.641 kg)  BMI 33.05 kg/m2  SpO2 100%  LMP 08/20/2011  Physical Exam  Nursing note and vitals reviewed. Constitutional: She appears well-developed and well-nourished. No distress.  HENT:  Head: Normocephalic and atraumatic.  Right Ear: External ear normal.  Left Ear: External ear normal.  Eyes: Conjunctivae are normal. Right eye exhibits no discharge. Left eye exhibits no discharge. No scleral icterus.  Neck: Neck supple. No tracheal deviation present.  Cardiovascular: Normal rate, regular rhythm and intact distal pulses.   Pulmonary/Chest: Effort normal and breath sounds normal. No stridor. No respiratory distress. She has no wheezes. She has no rales.  Abdominal: Soft. Bowel sounds are normal. She exhibits no distension. There is no tenderness. There is no rebound and no guarding.  Genitourinary: Vagina normal and uterus normal. Cervix exhibits no motion tenderness, no discharge and no friability. Right adnexum displays no mass, no tenderness and no fullness. Left adnexum displays no mass, no tenderness and no fullness. No vaginal discharge found.  Musculoskeletal: She exhibits no edema.       Mild tenderness to palpation thoracic paraspinal region, no erythema, no bony tenderness  Neurological: She is alert. She has normal strength. No sensory deficit. Cranial nerve deficit:  no gross defecits noted. She exhibits normal muscle tone. She displays no seizure activity. Coordination normal.  Skin: Skin is warm and dry. No rash noted.  Psychiatric: She has a normal mood and affect.    ED Course  Procedures (including critical care time) Bedside preg test positive. Labs Reviewed  CBC - Abnormal; Notable for the following:    WBC 16.6 (*)    All other components within normal limits  DIFFERENTIAL - Abnormal; Notable for the following:    Neutro Abs 12.2 (*)    Monocytes Absolute 1.1 (*)    All other components within  normal limits  HCG, QUANTITATIVE, PREGNANCY - Abnormal; Notable for the following:    hCG, Beta Chain, Quant, S 85 (*)    All other components within normal limits  URINALYSIS, ROUTINE W REFLEX MICROSCOPIC  POCT PREGNANCY, URINE  BASIC METABOLIC PANEL  ABO/RH  POCT PREGNANCY, URINE  GC/CHLAMYDIA PROBE AMP, GENITAL   No results found.   MDM  The patient has a nonsurgical abdomen. I doubt that she has a ruptured ectopic. She is very early in her pregnancy with a Quant hCG of 85. Patient will get a pelvic ultrasound. I suspect that we will not have any definitive findings on that. That is the case patient will need to followup closely with an OB/GYN doctor.  Dr Bebe Shaggy will follow up on the ultrasound result.        Celene Kras, MD 10/04/11 2207

## 2011-10-04 NOTE — ED Notes (Signed)
Pelvic exam completed by Dr. Roselyn Bering; assisted by myself.

## 2011-10-04 NOTE — ED Notes (Signed)
Pt presents with low back pain and RLQ pain. Pt recently found out she was pregnant. Pt states she has not seen OBGYN yet. Pt denies bleeding.

## 2011-10-12 ENCOUNTER — Other Ambulatory Visit: Payer: Self-pay | Admitting: Adult Health

## 2011-10-12 ENCOUNTER — Other Ambulatory Visit (HOSPITAL_COMMUNITY)
Admission: RE | Admit: 2011-10-12 | Discharge: 2011-10-12 | Disposition: A | Discharge status: Home / Self Care | Payer: Self-pay | Source: Ambulatory Visit | Attending: Obstetrics and Gynecology | Admitting: Obstetrics and Gynecology

## 2011-10-12 DIAGNOSIS — Z01419 Encounter for gynecological examination (general) (routine) without abnormal findings: Secondary | ICD-10-CM | POA: Insufficient documentation

## 2011-10-12 DIAGNOSIS — Z113 Encounter for screening for infections with a predominantly sexual mode of transmission: Secondary | ICD-10-CM | POA: Insufficient documentation

## 2011-11-18 ENCOUNTER — Encounter (HOSPITAL_COMMUNITY): Payer: Self-pay | Admitting: Emergency Medicine

## 2011-11-18 ENCOUNTER — Emergency Department (HOSPITAL_COMMUNITY)
Admission: EM | Admit: 2011-11-18 | Discharge: 2011-11-18 | Discharge status: Against Medical Advice | Payer: Medicaid Other | Attending: Emergency Medicine | Admitting: Emergency Medicine

## 2011-11-18 DIAGNOSIS — F909 Attention-deficit hyperactivity disorder, unspecified type: Secondary | ICD-10-CM | POA: Insufficient documentation

## 2011-11-18 DIAGNOSIS — Z79899 Other long term (current) drug therapy: Secondary | ICD-10-CM | POA: Insufficient documentation

## 2011-11-18 DIAGNOSIS — F341 Dysthymic disorder: Secondary | ICD-10-CM | POA: Insufficient documentation

## 2011-11-18 DIAGNOSIS — R350 Frequency of micturition: Secondary | ICD-10-CM | POA: Insufficient documentation

## 2011-11-18 DIAGNOSIS — F17200 Nicotine dependence, unspecified, uncomplicated: Secondary | ICD-10-CM | POA: Insufficient documentation

## 2011-11-18 DIAGNOSIS — R10816 Epigastric abdominal tenderness: Secondary | ICD-10-CM | POA: Insufficient documentation

## 2011-11-18 DIAGNOSIS — R300 Dysuria: Secondary | ICD-10-CM

## 2011-11-18 DIAGNOSIS — R110 Nausea: Secondary | ICD-10-CM | POA: Insufficient documentation

## 2011-11-18 DIAGNOSIS — F319 Bipolar disorder, unspecified: Secondary | ICD-10-CM | POA: Insufficient documentation

## 2011-11-18 DIAGNOSIS — R109 Unspecified abdominal pain: Secondary | ICD-10-CM | POA: Insufficient documentation

## 2011-11-18 LAB — URINALYSIS, ROUTINE W REFLEX MICROSCOPIC
Glucose, UA: NEGATIVE mg/dL
Leukocytes, UA: NEGATIVE
Protein, ur: NEGATIVE mg/dL
pH: 5.5 (ref 5.0–8.0)

## 2011-11-18 MED ORDER — ONDANSETRON 4 MG PO TBDP: 4.0000 mg | ORAL_TABLET | Freq: Once | ORAL | Status: DC

## 2011-11-18 NOTE — ED Notes (Signed)
Pt came to nursing station, reports her ride is here and she has no other way home, states she will go to her doctor tomorrow.   Pt informed she is welcome to come back if worse.   Pt ambulatory without diff.

## 2011-11-18 NOTE — ED Provider Notes (Signed)
History    This chart was scribed for Paula Lennert, MD, MD by Smitty Pluck. The patient was seen in room APA01 and the patient's care was started at 8:14PM.   CSN: [id number]  Arrival date & time 11/18/11  1738   First MD Initiated Contact with Patient 11/18/11 2002      Chief Complaint  Patient presents with  . Abdominal Pain  . Urinary Frequency  . Abdominal Pain    nausea    (Consider location/radiation/quality/duration/timing/severity/associated sxs/prior treatment) The history is provided by the patient.   [name] is a 25 y.o. female who presents to the Emergency Department complaining of moderate dull flank pain radiating into groin. Pt reports she has dysuria and nausea. Pt reports the pain has went from sharp to dull over the past couple of hours. Pt is 11-[redacted] weeks pregnant. Pt has a history of anxiety, depression ADHD.   Past Medical History  Diagnosis Date  . Seizures   . Anxiety   . Depression   . Bipolar 1 disorder   . ADHD (attention deficit hyperactivity disorder)     History reviewed. No pertinent past surgical history.  Family History  Problem Relation Age of Onset  . Cancer Other   . Seizures Other   . Stroke Other     History  Substance Use Topics  . Smoking status: Current Everyday Smoker -- 0.5 packs/day for 10 years    Types: Cigarettes  . Smokeless tobacco: Never Used  . Alcohol Use: No    OB History    Grav Para Term Preterm Abortions TAB SAB Ect Mult Living   4 1 1  1 1    1       Review of Systems  All other systems reviewed and are negative.   10 Systems reviewed and are negative for acute change except as noted in the HPI.  Allergies  Iohexol; Penicillins; and Sulfa antibiotics  Home Medications   Current Outpatient Rx  Name Route Sig Dispense Refill  . ACETAMINOPHEN 325 MG PO TABS Oral Take 650 mg by mouth every 6 (six) hours as needed. For pain     . OMEPRAZOLE 20 MG PO CPDR Oral Take 40 mg by mouth daily.       Marland Kitchen PRENATAL RX 60-1 MG PO TABS Oral Take 1 tablet by mouth daily. 30 tablet 0    BP 127/70  Pulse 82  Temp(Src) 98.3 F (36.8 C) (Oral)  Resp 17  Ht 5\' 3"  (1.6 m)  Wt 186 lb (84.369 kg)  BMI 32.95 kg/m2  SpO2 100%  LMP 08/20/2011  Physical Exam  Nursing note and vitals reviewed. Constitutional: She is oriented to person, place, and time. She appears well-developed and well-nourished.  HENT:  Head: Normocephalic and atraumatic.  Eyes: Conjunctivae and EOM are normal. No scleral icterus.  Neck: Neck supple. No thyromegaly present.  Cardiovascular: Normal rate and regular rhythm.  Exam reveals no gallop and no friction rub.   No murmur heard. Pulmonary/Chest: No stridor. She has no wheezes. She has no rales. She exhibits no tenderness.  Abdominal: She exhibits no distension. There is Tenderness: epigastric . There is no rebound.  Musculoskeletal: Normal range of motion. She exhibits no edema.  Lymphadenopathy:    She has no cervical adenopathy.  Neurological: She is oriented to person, place, and time. Coordination normal.  Skin: No rash noted. No erythema.  Psychiatric: She has a normal mood and affect. Her behavior is normal.  ED Course  Procedures (including critical care time)  DIAGNOSTIC STUDIES: Oxygen Saturation is 100% on room air, normal by my interpretation.    COORDINATION OF CARE:    Labs Reviewed  URINALYSIS, ROUTINE W REFLEX MICROSCOPIC - Abnormal; Notable for the following:    Specific Gravity, Urine >1.030 (*)    Ketones, ur TRACE (*)    All other components within normal limits   No results found.   1. Dysuria    Pt left ama.  She did not want to stay for results   MDM        The chart was scribed for me under my direct supervision.  I personally performed the history, physical, and medical decision making and all procedures in the evaluation of this patient.Paula Lennert, MD 11/18/11 [phone]

## 2011-11-18 NOTE — ED Notes (Addendum)
Patient c/o lower abd pain that radiates into groin with painful urination and frequency. Patient also c/o lower back pain. Patient reports being 11-[redacted] weeks pregnant.

## 2011-11-24 NOTE — L&D Delivery Note (Signed)
Delivery Note At 3:56 PM a viable female was delivered via Vaginal, Spontaneous Delivery (Presentation:LOA ). No nuchal APGAR: 9, 9; weight .   Placenta status: Intact, Spontaneous by Veatrice Kells. Membranes mec stained Cord: 3 vessels without complications.   Anesthesia: Epidural  Episiotomy: None Lacerations: None Suture Repair: n/a Est. Blood Loss (mL): 300cc  Mom to postpartum.  Baby to nursery-stable. Placenta to pathology  Hamda Klutts E. 05/26/2012, 4:19 PM

## 2011-12-28 ENCOUNTER — Other Ambulatory Visit: Payer: Self-pay | Admitting: Obstetrics and Gynecology

## 2012-01-17 ENCOUNTER — Other Ambulatory Visit: Payer: Self-pay | Admitting: Obstetrics and Gynecology

## 2012-02-01 ENCOUNTER — Encounter (HOSPITAL_COMMUNITY): Payer: Self-pay | Admitting: *Deleted

## 2012-02-01 ENCOUNTER — Other Ambulatory Visit: Payer: Self-pay

## 2012-02-01 ENCOUNTER — Emergency Department (HOSPITAL_COMMUNITY)
Admission: EM | Admit: 2012-02-01 | Discharge: 2012-02-02 | Disposition: A | Discharge status: Home / Self Care | Payer: Medicaid Other | Attending: Emergency Medicine | Admitting: Emergency Medicine

## 2012-02-01 DIAGNOSIS — R079 Chest pain, unspecified: Secondary | ICD-10-CM | POA: Insufficient documentation

## 2012-02-01 DIAGNOSIS — R202 Paresthesia of skin: Secondary | ICD-10-CM

## 2012-02-01 DIAGNOSIS — R209 Unspecified disturbances of skin sensation: Secondary | ICD-10-CM | POA: Insufficient documentation

## 2012-02-01 DIAGNOSIS — O99891 Other specified diseases and conditions complicating pregnancy: Secondary | ICD-10-CM | POA: Insufficient documentation

## 2012-02-01 NOTE — ED Notes (Signed)
Pt reports aching in chest starting about 2 pm.  Reports about 7 pm, arm began to have pins and needles feeling. Reporting headache all day.  Reports nausea and emesis x3 about 2200.  Pt reports being 5 months pregnant.  Denies complications with pregnancy.

## 2012-02-02 ENCOUNTER — Ambulatory Visit (HOSPITAL_COMMUNITY)
Admission: RE | Admit: 2012-02-02 | Discharge: 2012-02-02 | Disposition: A | Discharge status: Home / Self Care | Payer: Medicaid Other | Source: Ambulatory Visit | Attending: Emergency Medicine | Admitting: Emergency Medicine

## 2012-02-02 ENCOUNTER — Other Ambulatory Visit (HOSPITAL_COMMUNITY): Payer: Self-pay | Admitting: Emergency Medicine

## 2012-02-02 ENCOUNTER — Other Ambulatory Visit (HOSPITAL_COMMUNITY): Payer: Medicaid Other

## 2012-02-02 DIAGNOSIS — R52 Pain, unspecified: Secondary | ICD-10-CM

## 2012-02-02 DIAGNOSIS — M79609 Pain in unspecified limb: Secondary | ICD-10-CM | POA: Insufficient documentation

## 2012-02-02 DIAGNOSIS — R209 Unspecified disturbances of skin sensation: Secondary | ICD-10-CM | POA: Insufficient documentation

## 2012-02-02 LAB — POCT I-STAT TROPONIN I: Troponin i, poc: 0.01 ng/mL (ref 0.00–0.08)

## 2012-02-02 LAB — URINALYSIS, ROUTINE W REFLEX MICROSCOPIC
Hgb urine dipstick: NEGATIVE
Specific Gravity, Urine: <1.005 — ABNORMAL LOW (ref 1.005–1.030)
Urobilinogen, UA: 0.2 mg/dL (ref 0.0–1.0)

## 2012-02-02 LAB — POCT I-STAT, CHEM 8
Creatinine, Ser: 0.5 mg/dL (ref 0.50–1.10)
HCT: 37 % (ref 36.0–46.0)
Hemoglobin: 12.6 g/dL (ref 12.0–15.0)
Potassium: 3.4 mEq/L — ABNORMAL LOW (ref 3.5–5.1)
Sodium: 139 mEq/L (ref 135–145)

## 2012-02-02 LAB — CBC
HCT: 34.7 % — ABNORMAL LOW (ref 36.0–46.0)
Hemoglobin: 12 g/dL (ref 12.0–15.0)
RDW: 13.9 % (ref 11.5–15.5)
WBC: 16.7 10*3/uL — ABNORMAL HIGH (ref 4.0–10.5)

## 2012-02-02 MED ORDER — ACETAMINOPHEN 500 MG PO TABS
1000.0000 mg | ORAL_TABLET | Freq: Once | ORAL | Status: AC
  Administered 2012-02-02: 1000 mg via ORAL
  Filled 2012-02-02: qty 2

## 2012-02-02 NOTE — ED Provider Notes (Signed)
History   This chart was scribed for Vida Roller, MD by Sofie Rower. The patient was seen in room APA06/APA06 and the patient's care was started at 12:06AM.    CSN: [id number]  Arrival date & time 02/01/12  2236   First MD Initiated Contact with Patient 02/02/12 0001      No chief complaint on file.   (Consider location/radiation/quality/duration/timing/severity/associated sxs/prior treatment) HPI  [name] is a 26 y.o. female who presents to the Emergency Department complaining of moderate, intermittent chest pain located on the right side onset today (2:00PM) with associated symptoms of tingling in her right arm (7:00PM) and leg (both of which happen every time she takes a nap or sleeps at night - has been ongoing for several months), headache, rash. Pt states the pain "is an ache, which last for a few seconds". Pt is currently pregnant, 5.5 months. Pt states "this is her 2nd pregnancy, 1st did not have any complicated. Modifying factors include taking a deep breath which intensifies the pain. Pt has a hx of seizures, for which she does not take anything for the last 2.5-3 years.   Pt denies complications with pregnancy, swelling, cough, dysuria, blood clots.  PCP is Dr. Emelda Fear.   Past Medical History  Diagnosis Date  . Seizures   . Anxiety   . Depression   . Bipolar 1 disorder   . ADHD (attention deficit hyperactivity disorder)     History reviewed. No pertinent past surgical history.  Family History  Problem Relation Age of Onset  . Cancer Other   . Seizures Other   . Stroke Other     History  Substance Use Topics  . Smoking status: Current Everyday Smoker -- 0.5 packs/day for 10 years    Types: Cigarettes  . Smokeless tobacco: Never Used  . Alcohol Use: No    OB History    Grav Para Term Preterm Abortions TAB SAB Ect Mult Living   4 1 1  1 1    1       Review of Systems  All other systems reviewed and are negative.    10 Systems reviewed and  are negative for acute change except as noted in the HPI.   Allergies  Iohexol; Penicillins; and Sulfa antibiotics  Home Medications   Current Outpatient Rx  Name Route Sig Dispense Refill  . ACETAMINOPHEN 325 MG PO TABS Oral Take 650 mg by mouth every 6 (six) hours as needed. For pain     . OMEPRAZOLE 20 MG PO CPDR Oral Take 40 mg by mouth daily.      Marland Kitchen PRENATAL RX 60-1 MG PO TABS Oral Take 1 tablet by mouth daily. 30 tablet 0    BP 131/72  Pulse 93  Temp 98.2 F (36.8 C)  Resp 18  Ht 5\' 3"  (1.6 m)  Wt 186 lb (84.369 kg)  BMI 32.95 kg/m2  SpO2 99%  LMP 08/20/2011  Physical Exam  Nursing note and vitals reviewed. Constitutional: She is oriented to person, place, and time. She appears well-developed and well-nourished.  HENT:  Head: Normocephalic and atraumatic.  Right Ear: External ear normal.  Left Ear: External ear normal.  Nose: Nose normal.  Eyes: Conjunctivae and EOM are normal. No scleral icterus.  Neck: Normal range of motion. Neck supple. No thyromegaly present.  Cardiovascular: Normal rate, regular rhythm and normal heart sounds.  Exam reveals no gallop and no friction rub.   No murmur heard. Pulmonary/Chest: Effort normal and breath  sounds normal. No stridor. She has no wheezes. She has no rales. She exhibits no tenderness.  Abdominal: Soft. Bowel sounds are normal. She exhibits no distension. There is no tenderness. There is no rebound.  Musculoskeletal: Normal range of motion. She exhibits no edema and no tenderness.       No reproducible tenderness in the chest or arms. No edema to the legs.   Lymphadenopathy:    She has no cervical adenopathy.  Neurological: She is alert and oriented to person, place, and time. Coordination normal.       Gait normal, speech clear, strength and sensation are normal.   Skin: Skin is warm and dry. No erythema.       Faint blanching macular rash located on the vulvar bilateral forearms.   Psychiatric: She has a normal mood  and affect. Her behavior is normal.    ED Course  Procedures (including critical care time)  DIAGNOSTIC STUDIES: Oxygen Saturation is 99% on room air, normal by my interpretation.    COORDINATION OF CARE:     Labs Reviewed  CBC - Abnormal; Notable for the following:    WBC 16.7 (*)    HCT 34.7 (*)    All other components within normal limits  URINALYSIS, ROUTINE W REFLEX MICROSCOPIC - Abnormal; Notable for the following:    Specific Gravity, Urine <1.005 (*)    All other components within normal limits  POCT I-STAT, CHEM 8 - Abnormal; Notable for the following:    Potassium 3.4 (*)    BUN <3 (*)    All other components within normal limits  POCT I-STAT TROPONIN I   No results found.   1. Chest pain   2. Paresthesias     12:10AM- EDP at bedside discuss treatment plan.   MDM  I have reevaluated the patient who states that she wants to go home at this time. Her blood pressure has been normal, oxygen saturations have been 100% on room air without any labored breathing. She has no chest pain at this time but has a persistent tingling paresthesia in her right upper and lower extremities. Again she notes that this is a symptom that she has several times a night when she sleeps and also when she takes a nap during the daytime. She has no obvious neurologic deficit on exam with sensory or motor testing. I have encouraged her return in the morning for an ultrasound of her legs and followup with Dr. Emelda Fear, her OB/GYN within the next one to 2 days. The patient has expressed her understanding for return.   Laboratory results show normal troponin, normal urinalysis, basic metabolic panel with a potassium of 3.4 and an complete blood count with a white blood cell count of 16.7.  This is similar to the white blood cell count that she had her prior visit in December - and given that she is pregnant, this may not be abnormal.      I personally performed the services described in this  documentation, which was scribed in my presence. The recorded information has been reviewed and considered.        Vida Roller, MD 02/02/12 0130

## 2012-02-02 NOTE — Discharge Instructions (Signed)
Please come back for you ultrasound in the morning.  Your caregiver has diagnosed you as having chest pain that is nonspecific for one problem. This means that after looking at you and examining you and ordering tests (such as blood work, chest x-rays and EKG), your caregiver does not believe that the problem is serious enough to need watching in the hospital. This judgment is often made after testing shows no acute heart attack and you are at low risk for sudden acute heart condition. Chest pain comes from many different causes.  Seek immediate medical attention if:   You have severe chest pain, especially if the pain is crushing or pressure-like and spreads to the arms, back, neck, or jaw, or if you have sweating, nausea, shortness of breath. This is an emergency. Don't wait to see if the pain will go away. Get medical help at once. Call 911 immediately. Do not drive herself to the hospital.   Your chest pain gets worse and does not go away with rest.   You have an attack of chest pain lasting longer than usual, despite rest and treatment with the medications your caregiver has prescribed   You awaken from sleep with chest pain or shortness of breath.   You feel faint or dizzy   You have chest pain not typical of your usual pain for which you originally saw your caregiver.  You must have a repeat evaluation within 24 hours for a recheck of your heart.  Please call your doctor this morning to schedule this appointment. If you do not have a family doctor, please see the list of doctors below.  RESOURCE GUIDE  Dental Problems  Patients with Medicaid: [facility] [phone]                                           [phone] [address] [employer] [address] Phone:  [phone]                                                  Phone:  [phone]  If unable to pay or uninsured, contact:  [facility] or [facility] to become qualified  for the adult dental clinic.  Chronic Pain Problems Contact [facility]  [phone] Patients need to be referred by their primary care doctor.  Insufficient Money for Medicine Contact [employer]:  call "211" or [facility] 9157692283.  No Primary Care Doctor Call [facility]  [phone] Other agencies that provide inexpensive medical care    [facility]  [phone]    [facility]  [phone]    [facility]  [phone]    [facility]  [phone]    [facility]  [phone]    [facility]  [phone]  Psychological Services [facility]  [phone] [facility]  [phone] [facility]   [phone] (emergency services [phone])  Substance Abuse Resources [facility]  [phone] [facility] [phone] [facility] [phone] Floydene Flock [phone] Residential & Outpatient Substance Abuse Program  [phone]  Abuse/Neglect [facility] Child Abuse Hotline (404)357-8142 [facility] Child Abuse Hotline 9897294717 (After Hours)  Emergency Shelter [facility] [phone]  M[facility] [place] [facility] (224)374-6656 [facility] [phone]  MRSA Hotline #:   864-497-4260    [facility] Resources  [facility]     [employer]                          [facility] Dept. [address] [place]                       [address]      [address]  Blondell Reveal Phone:  [phone]                                   Phone:  [phone]                 Phone:  [phone]  [facility] Phone:  [phone]  [facility] Child Abuse Hotline (808)422-8732 [phone])[phone] (After Hours)

## 2012-02-12 ENCOUNTER — Other Ambulatory Visit: Payer: Self-pay | Admitting: Obstetrics and Gynecology

## 2012-02-17 ENCOUNTER — Encounter (HOSPITAL_COMMUNITY): Payer: Self-pay | Admitting: *Deleted

## 2012-02-17 ENCOUNTER — Emergency Department (HOSPITAL_COMMUNITY)
Admission: EM | Admit: 2012-02-17 | Discharge: 2012-02-17 | Disposition: A | Discharge status: Home / Self Care | Payer: Medicaid Other | Attending: Emergency Medicine | Admitting: Emergency Medicine

## 2012-02-17 DIAGNOSIS — O210 Mild hyperemesis gravidarum: Secondary | ICD-10-CM | POA: Insufficient documentation

## 2012-02-17 DIAGNOSIS — M259 Joint disorder, unspecified: Secondary | ICD-10-CM | POA: Insufficient documentation

## 2012-02-17 DIAGNOSIS — M899 Disorder of bone, unspecified: Secondary | ICD-10-CM | POA: Insufficient documentation

## 2012-02-17 DIAGNOSIS — M549 Dorsalgia, unspecified: Secondary | ICD-10-CM | POA: Insufficient documentation

## 2012-02-17 NOTE — ED Notes (Signed)
5 months pregnant, states she started on Lexapro yesterday, states she has been nauseated ever since, states she passed some clear fluid yesterday and c/o back pain

## 2012-03-28 ENCOUNTER — Emergency Department (HOSPITAL_COMMUNITY)
Admission: EM | Admit: 2012-03-28 | Discharge: 2012-03-28 | Disposition: A | Discharge status: Home / Self Care | Payer: Medicaid Other | Attending: Emergency Medicine | Admitting: Emergency Medicine

## 2012-03-28 ENCOUNTER — Encounter (HOSPITAL_COMMUNITY): Payer: Self-pay | Admitting: *Deleted

## 2012-03-28 DIAGNOSIS — R569 Unspecified convulsions: Secondary | ICD-10-CM | POA: Insufficient documentation

## 2012-03-28 DIAGNOSIS — IMO0002 Reserved for concepts with insufficient information to code with codable children: Secondary | ICD-10-CM | POA: Insufficient documentation

## 2012-03-28 DIAGNOSIS — F319 Bipolar disorder, unspecified: Secondary | ICD-10-CM | POA: Insufficient documentation

## 2012-03-28 DIAGNOSIS — F909 Attention-deficit hyperactivity disorder, unspecified type: Secondary | ICD-10-CM | POA: Insufficient documentation

## 2012-03-28 DIAGNOSIS — L02411 Cutaneous abscess of right axilla: Secondary | ICD-10-CM

## 2012-03-28 DIAGNOSIS — F411 Generalized anxiety disorder: Secondary | ICD-10-CM | POA: Insufficient documentation

## 2012-03-28 DIAGNOSIS — F17200 Nicotine dependence, unspecified, uncomplicated: Secondary | ICD-10-CM | POA: Insufficient documentation

## 2012-03-28 MED ORDER — ONDANSETRON HCL 4 MG PO TABS
4.0000 mg | ORAL_TABLET | Freq: Once | ORAL | Status: AC
  Administered 2012-03-28: 4 mg via ORAL
  Filled 2012-03-28: qty 1

## 2012-03-28 MED ORDER — OXYCODONE-ACETAMINOPHEN 5-325 MG PO TABS: 1.0000 | ORAL_TABLET | Freq: Four times a day (QID) | ORAL | Status: DC | PRN

## 2012-03-28 MED ORDER — LIDOCAINE HCL (PF) 1 % IJ SOLN
INTRAMUSCULAR | Status: AC
  Filled 2012-03-28: qty 10

## 2012-03-28 MED ORDER — ACETAMINOPHEN 325 MG PO TABS
650.0000 mg | ORAL_TABLET | Freq: Once | ORAL | Status: AC
  Administered 2012-03-28: 650 mg via ORAL
  Filled 2012-03-28: qty 2

## 2012-03-28 MED ORDER — CEPHALEXIN 500 MG PO CAPS: 500.0000 mg | ORAL_CAPSULE | Freq: Four times a day (QID) | ORAL | Status: AC

## 2012-03-28 MED ORDER — ONDANSETRON HCL 4 MG PO TABS: 4.0000 mg | ORAL_TABLET | Freq: Four times a day (QID) | ORAL | Status: AC

## 2012-03-28 MED ORDER — CEPHALEXIN 500 MG PO CAPS
500.0000 mg | ORAL_CAPSULE | Freq: Once | ORAL | Status: AC
  Administered 2012-03-28: 500 mg via ORAL
  Filled 2012-03-28: qty 1

## 2012-03-28 NOTE — Discharge Instructions (Signed)
Please have the abscess removed on may 8. Keflex after each meal and at bed time. Tylenol for mild pain. Percocet for more severe pain. This medication may cause drowsiness and constipation, use with caution. AFTER May 8, please soak the abscess site in warm tub or water for 15 to 20 min daily until the abscess is resolved.Abscess An abscess (boil or furuncle) is an infected area under your skin. This area is filled with yellowish white fluid (pus). HOME CARE   Only take medicine as told by your doctor.   Keep the skin clean around your abscess. Keep clothes that may touch the abscess clean.   Change any bandages (dressings) as told by your doctor.   Avoid direct skin contact with other people. The infection can spread by skin contact with others.   Practice good hygiene and do not share personal care items.   Do not share athletic equipment, towels, or whirlpools. Shower after every practice or work out session.   If a draining area cannot be covered:   Do not play sports.   Children should not go to daycare until the wound has healed or until fluid (drainage) stops coming out of the wound.   See your doctor for a follow-up visit as told.  GET HELP RIGHT AWAY IF:   There is more pain, puffiness (swelling), and redness in the wound site.   There is fluid or bleeding from the wound site.   You have muscle aches, chills, fever, or feel sick.   You or your child has a temperature by mouth above 102 F (38.9 C), not controlled by medicine.   Your baby is older than 3 months with a rectal temperature of 102 F (38.9 C) or higher.  MAKE SURE YOU:   Understand these instructions.   Will watch your condition.   Will get help right away if you are not doing well or get worse.  Document Released: 04/27/2008 Document Revised: 10/29/2011 Document Reviewed: 04/27/2008 [facility] Patient Information 2012 [place], [place].

## 2012-03-28 NOTE — ED Notes (Signed)
Pt DC to home with steady gait 

## 2012-03-28 NOTE — ED Notes (Signed)
Abscess rt axilla.x 4 days

## 2012-03-28 NOTE — ED Provider Notes (Signed)
History     CSN: [id number]  Arrival date & time 03/28/12  1522   First MD Initiated Contact with Patient 03/28/12 1638      Chief Complaint  Patient presents with  . Abscess    (Consider location/radiation/quality/duration/timing/severity/associated sxs/prior treatment) Patient is a 26 y.o. female presenting with abscess. The history is provided by the patient.  Abscess  This is a new problem. The current episode started less than one week ago. The problem occurs frequently. The problem has been gradually worsening. Affected Location: right axilla. The problem is severe. The abscess is characterized by redness and painfulness. It is unknown what she was exposed to. Pertinent negatives include no fever. There were no sick contacts. She has received no recent medical care.    Past Medical History  Diagnosis Date  . Seizures   . Anxiety   . Depression   . Bipolar 1 disorder   . ADHD (attention deficit hyperactivity disorder)   . Pregnant     History reviewed. No pertinent past surgical history.  Family History  Problem Relation Age of Onset  . Cancer Other   . Seizures Other   . Stroke Other     History  Substance Use Topics  . Smoking status: Current Everyday Smoker -- 0.5 packs/day for 10 years    Types: Cigarettes  . Smokeless tobacco: Never Used  . Alcohol Use: No    OB History    Grav Para Term Preterm Abortions TAB SAB Ect Mult Living   4 1 1  1 1    1       Review of Systems  Constitutional: Negative for fever and activity change.       All ROS Neg except as noted in HPI  HENT: Negative for nosebleeds and neck pain.   Eyes: Negative for photophobia and discharge.  Respiratory: Negative for shortness of breath and wheezing.   Cardiovascular: Negative for chest pain and palpitations.  Gastrointestinal: Negative for abdominal pain and blood in stool.  Genitourinary: Negative for dysuria, frequency and hematuria.  Musculoskeletal: Negative for back pain  and arthralgias.  Skin: Negative.   Neurological: Positive for seizures. Negative for dizziness and speech difficulty.  Psychiatric/Behavioral: Negative for hallucinations and confusion. The patient is nervous/anxious.        Depression    Allergies  Iohexol; Penicillins; and Sulfa antibiotics  Home Medications   Current Outpatient Rx  Name Route Sig Dispense Refill  . ACETAMINOPHEN 325 MG PO TABS Oral Take 650 mg by mouth every 6 (six) hours as needed. For pain     . OMEPRAZOLE 20 MG PO CPDR Oral Take 40 mg by mouth daily.      Marland Kitchen PRENATAL RX 60-1 MG PO TABS Oral Take 1 tablet by mouth daily. 30 tablet 0    BP 103/53  Pulse 98  Temp(Src) 98.3 F (36.8 C) (Oral)  Resp 18  Ht 5\' 3"  (1.6 m)  Wt 192 lb (87.091 kg)  BMI 34.01 kg/m2  SpO2 98%  LMP 08/20/2011  Physical Exam  Nursing note and vitals reviewed. Constitutional: She is oriented to person, place, and time. She appears well-developed and well-nourished.  Non-toxic appearance.  HENT:  Head: Normocephalic.  Right Ear: Tympanic membrane and external ear normal.  Left Ear: Tympanic membrane and external ear normal.  Eyes: EOM and lids are normal. Pupils are equal, round, and reactive to light.  Neck: Normal range of motion. Neck supple. Carotid bruit is not present.  Cardiovascular: Normal rate,  regular rhythm, normal heart sounds, intact distal pulses and normal pulses.   Pulmonary/Chest: Breath sounds normal. No respiratory distress.  Abdominal: Soft. Bowel sounds are normal. There is no tenderness. There is no guarding.       Abd is gravid.  Musculoskeletal: Normal range of motion.       Painful abscess of the right axilla. No redstreaking.  Lymphadenopathy:       Head (right side): No submandibular adenopathy present.       Head (left side): No submandibular adenopathy present.    She has no cervical adenopathy.  Neurological: She is alert and oriented to person, place, and time. She has normal strength. No cranial  nerve deficit or sensory deficit.  Skin: Skin is warm and dry.  Psychiatric: She has a normal mood and affect. Her speech is normal.    ED Course  Procedures ; I AND D OF RIGHT AXILLA ABSCESS. - The patient was identified with arm band. Permission was given for procedure by the patient. The  Right axilla was painted with Betadine. The abscess area was infiltrated with 1% plain lidocaine. Incision carried out with a #11 blade. A culture was obtained. The wound was irrigated with normal saline. The wound was packed with one quarter iodoform gauze, and sterile bandage applied by me. The patient tolerated the procedure without problem.  Labs Reviewed - No data to display No results found.   No diagnosis found.    MDM  I have reviewed nursing notes, vital signs, and all appropriate lab and imaging results for this patient. Patient is allergic to penicillin and sulfa. The case was discussed with Dr. Emelda Fear and he suggested using Keflex. He also approved. The patient having Percocet for pain. He will see the patient in the office on May 8. Rx for keflex, zofran, and percocet given to the patient.       Kathie Dike, [place] 04/02/12 [phone]

## 2012-03-29 ENCOUNTER — Inpatient Hospital Stay (HOSPITAL_COMMUNITY)
Admission: AD | Admit: 2012-03-29 | Discharge: 2012-03-29 | Disposition: A | Discharge status: Home / Self Care | Payer: Medicaid Other | Source: Ambulatory Visit | Attending: Obstetrics & Gynecology | Admitting: Obstetrics & Gynecology

## 2012-03-29 ENCOUNTER — Encounter (HOSPITAL_COMMUNITY): Payer: Self-pay | Admitting: *Deleted

## 2012-03-29 DIAGNOSIS — R109 Unspecified abdominal pain: Secondary | ICD-10-CM | POA: Insufficient documentation

## 2012-03-29 DIAGNOSIS — O4700 False labor before 37 completed weeks of gestation, unspecified trimester: Secondary | ICD-10-CM | POA: Insufficient documentation

## 2012-03-29 DIAGNOSIS — O479 False labor, unspecified: Secondary | ICD-10-CM

## 2012-03-29 LAB — URINALYSIS, ROUTINE W REFLEX MICROSCOPIC
Leukocytes, UA: NEGATIVE
Nitrite: NEGATIVE
Specific Gravity, Urine: <1.005 — ABNORMAL LOW (ref 1.005–1.030)
pH: 6.5 (ref 5.0–8.0)

## 2012-03-29 LAB — WET PREP, GENITAL
Clue Cells Wet Prep HPF POC: NONE SEEN
Trich, Wet Prep: NONE SEEN

## 2012-03-29 NOTE — Discharge Instructions (Signed)

## 2012-03-29 NOTE — MAU Note (Signed)
Pt states started feeling pressure and cramping around 1830 today-states she was at [facility] from 4pm-630pm due to an abcess under her rt arm-it was lanced,cleaned out and packed-the pt was put on antibiotics

## 2012-03-29 NOTE — MAU Provider Note (Signed)
History     CSN: [id number]  Arrival date and time: 03/29/12 0015   None     Chief Complaint  Patient presents with  . Abdominal Cramping   HPI This is a G4P1011 at 28.6 weeks who was seen at [facility] earlier this evening for a right axillary abscess that was incised and drained.  After leaving the hospital, she began to have pelvic cramping and discharge.  Discharge is clear and not associated with vaginal itching or foul odor.  Denies contractions, leaking fluid, vaginal bleeding.    OB History    Grav Para Term Preterm Abortions TAB SAB Ect Mult Living   4 1 1  1 1    1       Past Medical History  Diagnosis Date  . Seizures   . Anxiety   . Depression   . Bipolar 1 disorder   . ADHD (attention deficit hyperactivity disorder)   . Pregnant     History reviewed. No pertinent past surgical history.  Family History  Problem Relation Age of Onset  . Cancer Other   . Seizures Other   . Stroke Other   . Cancer Mother   . Diabetes Mother   . Hypertension Mother   . Cancer Father   . Diabetes Father   . Hypertension Father     History  Substance Use Topics  . Smoking status: Current Everyday Smoker -- 0.5 packs/day for 10 years    Types: Cigarettes  . Smokeless tobacco: Never Used  . Alcohol Use: No    Allergies:  Allergies  Allergen Reactions  . Iohexol Anaphylaxis and Hives    difficulty breathing, felt like throat was closing up, hives. Pt was given benadryl prior to ct for other reasons. after going back to er. was not given any other treatment before going home   . Penicillins Other (See Comments)    Patient states that she has convulsions.  . Sulfa Antibiotics Other (See Comments)    Patient states that she has convulsions.    Prescriptions prior to admission  Medication Sig Dispense Refill  . acetaminophen (TYLENOL) 325 MG tablet Take 650 mg by mouth every 6 (six) hours as needed. For pain       . cephALEXin (KEFLEX) 500 MG capsule Take 1  capsule (500 mg total) by mouth 4 (four) times daily.  28 capsule  0  . omeprazole (PRILOSEC) 20 MG capsule Take 40 mg by mouth daily.        . ondansetron (ZOFRAN) 4 MG tablet Take 1 tablet (4 mg total) by mouth every 6 (six) hours.  12 tablet  0  . oxyCODONE-acetaminophen (PERCOCET) 5-325 MG per tablet Take 1 tablet by mouth every 6 (six) hours as needed for pain.  20 tablet  0  . Prenatal Vit-Fe Fumarate-FA (PRENATAL MULTIVITAMIN) 60-1 MG tablet Take 1 tablet by mouth daily.  30 tablet  0    ROS Physical Exam   Blood pressure 131/75, pulse 90, temperature 98.3 F (36.8 C), temperature source Oral, resp. rate 20, height 5\' 3"  (1.6 m), weight 87.998 kg (194 lb), last menstrual period 08/20/2011, SpO2 99.00%.  Physical Exam  Constitutional: She is oriented to person, place, and time. She appears well-developed and well-nourished.  GI: Soft. Bowel sounds are normal. She exhibits no distension and no mass. There is no tenderness. There is no rebound and no guarding.  Genitourinary:       Clear discharge.  No vaginal lesions or bleeding.  Neurological: She is alert and oriented to person, place, and time.  Skin: Skin is warm and dry.  Psychiatric: She has a normal mood and affect. Her behavior is normal. Judgment and thought content normal.   Results for orders placed during the hospital encounter of 03/29/12 (from the past 24 hour(s))  URINALYSIS, ROUTINE W REFLEX MICROSCOPIC     Status: Abnormal   Collection Time   03/29/12 12:15 AM      Component Value Range   Color, Urine YELLOW  YELLOW    APPearance CLEAR  CLEAR    Specific Gravity, Urine <1.005 (*) 1.005 - 1.030    pH 6.5  5.0 - 8.0    Glucose, UA NEGATIVE  NEGATIVE (mg/dL)   Hgb urine dipstick NEGATIVE  NEGATIVE    Bilirubin Urine NEGATIVE  NEGATIVE    Ketones, ur NEGATIVE  NEGATIVE (mg/dL)   Protein, ur NEGATIVE  NEGATIVE (mg/dL)   Urobilinogen, UA 0.2  0.0 - 1.0 (mg/dL)   Nitrite NEGATIVE  NEGATIVE    Leukocytes, UA NEGATIVE   NEGATIVE   WET PREP, GENITAL     Status: Abnormal   Collection Time   03/29/12  1:00 AM      Component Value Range   Yeast Wet Prep HPF POC NONE SEEN  NONE SEEN    Trich, Wet Prep NONE SEEN  NONE SEEN    Clue Cells Wet Prep HPF POC NONE SEEN  NONE SEEN    WBC, Wet Prep HPF POC FEW (*) NONE SEEN      MAU Course  Procedures  MDM No evidence of UTI or vaginal infection.   Cervix closed.  No evidence of preterm labor  Assessment and Plan  1.  Braxton hicks contractions  Patient observed for 1 hour.  Cramping stopped soon after arrival and did not return.  Discussed braxton hicks contractions and signs/symptoms of preterm labor.  Patient has follow up with [facility] within 2-3 days.  Encouraged pt to return if cramping returned.  Denaja Verhoeven JEHIEL 03/29/2012, 1:00 AM

## 2012-03-29 NOTE — MAU Note (Signed)
Rt underarm dressing replaced by Dr Peterson Ao states it feels much better

## 2012-03-30 ENCOUNTER — Emergency Department (HOSPITAL_COMMUNITY)
Admission: EM | Admit: 2012-03-30 | Discharge: 2012-03-30 | Disposition: A | Discharge status: Home / Self Care | Payer: Medicaid Other | Attending: Emergency Medicine | Admitting: Emergency Medicine

## 2012-03-30 ENCOUNTER — Encounter (HOSPITAL_COMMUNITY): Payer: Self-pay | Admitting: *Deleted

## 2012-03-30 DIAGNOSIS — L0291 Cutaneous abscess, unspecified: Secondary | ICD-10-CM

## 2012-03-30 DIAGNOSIS — O99340 Other mental disorders complicating pregnancy, unspecified trimester: Secondary | ICD-10-CM | POA: Insufficient documentation

## 2012-03-30 DIAGNOSIS — O99891 Other specified diseases and conditions complicating pregnancy: Secondary | ICD-10-CM | POA: Insufficient documentation

## 2012-03-30 DIAGNOSIS — IMO0002 Reserved for concepts with insufficient information to code with codable children: Secondary | ICD-10-CM | POA: Insufficient documentation

## 2012-03-30 DIAGNOSIS — F909 Attention-deficit hyperactivity disorder, unspecified type: Secondary | ICD-10-CM | POA: Insufficient documentation

## 2012-03-30 DIAGNOSIS — F319 Bipolar disorder, unspecified: Secondary | ICD-10-CM | POA: Insufficient documentation

## 2012-03-30 DIAGNOSIS — O99330 Smoking (tobacco) complicating pregnancy, unspecified trimester: Secondary | ICD-10-CM | POA: Insufficient documentation

## 2012-03-30 MED ORDER — ACETAMINOPHEN-CODEINE #3 300-30 MG PO TABS: 1.0000 | ORAL_TABLET | Freq: Four times a day (QID) | ORAL | Status: AC | PRN

## 2012-03-30 NOTE — ED Notes (Signed)
Packing was removed from rt axilla , Pt tolerated well.

## 2012-03-30 NOTE — ED Notes (Signed)
Here to have packing removed from under left arm which was placed Monday. Pt states she also wants to have left ear checked.

## 2012-03-30 NOTE — ED Provider Notes (Signed)
History     CSN: [id number]  Arrival date & time 03/30/12  1203   First MD Initiated Contact with Patient 03/30/12 1225      Chief Complaint  Patient presents with  . Wound Check    (Consider location/radiation/quality/duration/timing/severity/associated sxs/prior treatment) HPI Comments: Patient comes to ED requesting removal of packing that was placed two days ago after an I&D to abscess of the right axilla.  She reports pain and swelling have improved.    Patient is a 26 y.o. female presenting with wound check. The history is provided by the patient.  Wound Check  Previous treatment in the ED includes I&D of abscess. Treatments since wound repair include oral antibiotics. Fever duration: no fever. There has been colored discharge from the wound. The redness has improved. The swelling has improved. The pain has improved.    Past Medical History  Diagnosis Date  . Seizures   . Anxiety   . Depression   . Bipolar 1 disorder   . ADHD (attention deficit hyperactivity disorder)   . Pregnant     History reviewed. No pertinent past surgical history.  Family History  Problem Relation Age of Onset  . Cancer Other   . Seizures Other   . Stroke Other   . Cancer Mother   . Diabetes Mother   . Hypertension Mother   . Cancer Father   . Diabetes Father   . Hypertension Father     History  Substance Use Topics  . Smoking status: Current Everyday Smoker -- 0.5 packs/day for 10 years    Types: Cigarettes  . Smokeless tobacco: Never Used  . Alcohol Use: No    OB History    Grav Para Term Preterm Abortions TAB SAB Ect Mult Living   4 1 1  1 1    1       Review of Systems  Constitutional: Negative for fever.  Musculoskeletal: Negative for joint swelling and arthralgias.  Skin:       abscess  All other systems reviewed and are negative.    Allergies  Iohexol; Penicillins; and Sulfa antibiotics  Home Medications   Current Outpatient Rx  Name Route Sig Dispense  Refill  . ACETAMINOPHEN 325 MG PO TABS Oral Take 650 mg by mouth every 6 (six) hours as needed. For pain     . CEPHALEXIN 500 MG PO CAPS Oral Take 1 capsule (500 mg total) by mouth 4 (four) times daily. 28 capsule 0  . DIPHENHYDRAMINE HCL 25 MG PO CAPS Oral Take 25 mg by mouth every 6 (six) hours as needed. For allergies    . OMEPRAZOLE 20 MG PO CPDR Oral Take 40 mg by mouth daily.      Marland Kitchen ONDANSETRON HCL 4 MG PO TABS Oral Take 1 tablet (4 mg total) by mouth every 6 (six) hours. 12 tablet 0  . OXYCODONE-ACETAMINOPHEN 5-325 MG PO TABS Oral Take 1 tablet by mouth every 6 (six) hours as needed for pain. 20 tablet 0  . PRENATAL RX 60-1 MG PO TABS Oral Take 1 tablet by mouth daily. 30 tablet 0    BP 110/51  Pulse 95  Temp(Src) 98.1 F (36.7 C) (Oral)  Resp 18  Ht 5\' 3"  (1.6 m)  Wt 194 lb (87.998 kg)  BMI 34.37 kg/m2  SpO2 98%  LMP 08/20/2011  Physical Exam  Nursing note and vitals reviewed. Constitutional: She is oriented to person, place, and time. She appears well-developed and well-nourished. No distress.  HENT:  Head: Normocephalic and atraumatic.  Cardiovascular: Normal rate, regular rhythm and normal heart sounds.   Pulmonary/Chest: Effort normal and breath sounds normal.  Musculoskeletal: She exhibits no edema.  Neurological: She is oriented to person, place, and time. She exhibits normal muscle tone. Coordination normal.  Skin: Skin is warm.       Abscess of the right axilla with previous I&D .  Appears to be improving.  Packing in place    ED Course  Procedures (including critical care time)       MDM   Previous medical charts, nursing notes and vitals signs from this visit were reviewed by me   All laboratory results and/or imaging results performed on this visit, if applicable, were reviewed by me and discussed with the patient and/or parent as well as recommendation for follow-up    MEDICATIONS GIVEN IN ED:  none   Patient is [redacted] weeks pregnant. Here for  packing removal from a previous incision and drainage of the right axilla.  Abscess appears to be healing well no significant surrounding erythema. Packing was removed by me without difficulty. Patient currently taking Keflex. States that she was unable to take the Percocet due to side effects and is requesting something else for pain.     PRESCRIPTIONS GIVEN AT DISCHARGE:  Tylenol 3 #15    Pt stable in ED with no significant deterioration in condition. Pt feels improved after observation and/or treatment in ED. Patient / Family / Caregiver understand and agree with initial ED impression and plan with expectations set for ED visit.  Patient agrees to return to ED for any worsening symptoms        Natia Fahmy L. [place], [place] 03/30/12 2109

## 2012-03-30 NOTE — Discharge Instructions (Signed)
Abscess  Care After  An abscess (also called a boil or furuncle) is an infected area that contains a collection of pus. Signs and symptoms of an abscess include pain, tenderness, redness, or hardness, or you may feel a moveable soft area under your skin. An abscess can occur anywhere in the body. The infection may spread to surrounding tissues causing cellulitis. A cut (incision) by the surgeon was made over your abscess and the pus was drained out. Gauze may have been packed into the space to provide a drain that will allow the cavity to heal from the inside outwards. The boil may be painful for 5 to 7 days. Most people with a boil do not have high fevers. Your abscess, if seen early, may not have localized, and may not have been lanced. If not, another appointment may be required for this if it does not get better on its own or with medications.  HOME CARE INSTRUCTIONS     Only take over-the-counter or prescription medicines for pain, discomfort, or fever as directed by your caregiver.    When you bathe, soak and then remove gauze or iodoform packs at least daily or as directed by your caregiver. You may then wash the wound gently with mild soapy water. Repack with gauze or do as your caregiver directs.   SEEK IMMEDIATE MEDICAL CARE IF:     You develop increased pain, swelling, redness, drainage, or bleeding in the wound site.    You develop signs of generalized infection including muscle aches, chills, fever, or a general ill feeling.    An oral temperature above 102 F (38.9 C) develops, not controlled by medication.   See your caregiver for a recheck if you develop any of the symptoms described above. If medications (antibiotics) were prescribed, take them as directed.  Document Released: 05/28/2005 Document Revised: 10/29/2011 Document Reviewed: 01/23/2008  [employer] Patient Information 2012 [employer].

## 2012-04-01 LAB — CULTURE, ROUTINE-ABSCESS

## 2012-04-01 NOTE — ED Provider Notes (Signed)
Medical screening examination/treatment/procedure(s) were performed by non-physician practitioner and as supervising physician I was immediately available for consultation/collaboration. Devoria Albe, MD, Armando Gang   Ward Givens, MD 04/01/12 [phone]

## 2012-04-03 NOTE — ED Provider Notes (Signed)
Medical screening examination/treatment/procedure(s) were performed by non-physician practitioner and as supervising physician I was immediately available for consultation/collaboration.   Ranya Fiddler L Shakhia Gramajo, MD 04/03/12 0757 

## 2012-04-13 ENCOUNTER — Encounter (HOSPITAL_COMMUNITY): Payer: Self-pay | Admitting: *Deleted

## 2012-04-13 ENCOUNTER — Inpatient Hospital Stay (HOSPITAL_COMMUNITY)
Admission: AD | Admit: 2012-04-13 | Discharge: 2012-04-13 | Disposition: A | Discharge status: Home / Self Care | Payer: Medicaid Other | Source: Ambulatory Visit | Attending: Obstetrics & Gynecology | Admitting: Obstetrics & Gynecology

## 2012-04-13 DIAGNOSIS — O212 Late vomiting of pregnancy: Secondary | ICD-10-CM | POA: Insufficient documentation

## 2012-04-13 DIAGNOSIS — R109 Unspecified abdominal pain: Secondary | ICD-10-CM | POA: Insufficient documentation

## 2012-04-13 DIAGNOSIS — R197 Diarrhea, unspecified: Secondary | ICD-10-CM

## 2012-04-13 DIAGNOSIS — O99891 Other specified diseases and conditions complicating pregnancy: Secondary | ICD-10-CM | POA: Insufficient documentation

## 2012-04-13 LAB — URINALYSIS, ROUTINE W REFLEX MICROSCOPIC
Bilirubin Urine: NEGATIVE
Glucose, UA: NEGATIVE mg/dL
Ketones, ur: NEGATIVE mg/dL
Leukocytes, UA: NEGATIVE
pH: 6 (ref 5.0–8.0)

## 2012-04-13 NOTE — Progress Notes (Signed)
Dr. Dareen Piano called to be given pt's results

## 2012-04-13 NOTE — Progress Notes (Signed)
Blood pressure and headache

## 2012-04-13 NOTE — MAU Note (Signed)
Pt states she started feeling sick on 04/03/2012. Pt states she started having diarrhea.

## 2012-04-13 NOTE — MAU Note (Signed)
Patient states she has had diarrhea with every stool since 5-12. Has had some vomiting but is able to keep most things down. Has been having left upper abdominal pain, denies any bleeding or leaking or contractions. Reports baby moving but different, not as vigorous.

## 2012-04-13 NOTE — MAU Provider Note (Signed)
History     CSN: [id number]  Arrival date and time: 04/13/12 1740   First Provider Initiated Contact with Patient 04/13/12 1842      Chief Complaint  Patient presents with  . Diarrhea  . Abdominal Pain   HPI [name] 26 y.o. [redacted]w[redacted]d  presents today with reports of diarrhea since 04-04-12.  Patient states she had sudden onset of n/v/d on the 13th, with which the n/v resolved the next day.  However, the diarrhea has persisted.  Pt states it is light golden brown in color, 2-3 x per day with watery to loose consistency.  States is able to eat and tolerate a regular diet.  Reports frequent headaches which occur after she wakes and increase in severity throughout the day with a band like pressure across her forehead.  Reports she has LUQ abdominal pain and awakes sometimes at night choking and coughing and needs to sit on side of her bed to calm and go back to sleep.  Does not take any meds for headache.  Takes omeprazole for reflux.  OB History    Grav Para Term Preterm Abortions TAB SAB Ect Mult Living   4 1 1  1 1    1       Past Medical History  Diagnosis Date  . Seizures   . Anxiety   . Depression   . Bipolar 1 disorder   . ADHD (attention deficit hyperactivity disorder)   . Pregnant     History reviewed. No pertinent past surgical history.  Family History  Problem Relation Age of Onset  . Cancer Other   . Seizures Other   . Stroke Other   . Cancer Mother   . Diabetes Mother   . Hypertension Mother   . Cancer Father   . Diabetes Father   . Hypertension Father     History  Substance Use Topics  . Smoking status: Current Everyday Smoker -- 0.5 packs/day for 10 years    Types: Cigarettes  . Smokeless tobacco: Never Used  . Alcohol Use: No    Allergies:  Allergies  Allergen Reactions  . Iohexol Anaphylaxis and Hives    difficulty breathing, felt like throat was closing up, hives. Pt was given benadryl prior to ct for other reasons. after going back to er.  was not given any other treatment before going home   . Penicillins Other (See Comments)    Patient states that she has convulsions.  . Sulfa Antibiotics Other (See Comments)    Patient states that she has convulsions.    Prescriptions prior to admission  Medication Sig Dispense Refill  . acetaminophen (TYLENOL) 325 MG tablet Take 650 mg by mouth every 6 (six) hours as needed. For pain       . diphenhydrAMINE (BENADRYL) 25 mg capsule Take 25 mg by mouth every 6 (six) hours as needed. For allergies      . omeprazole (PRILOSEC) 20 MG capsule Take 40 mg by mouth daily.        . Prenatal Vit-Fe Fumarate-FA (PRENATAL MULTIVITAMIN) 60-1 MG tablet Take 1 tablet by mouth daily.  30 tablet  0    Review of Systems  Constitutional: Negative.   HENT: Negative for sore throat.   Eyes: Negative for blurred vision, double vision and pain.  Respiratory: Positive for cough and shortness of breath.        See HPI.   Cardiovascular: Negative.   Gastrointestinal: Positive for heartburn and abdominal pain. Negative for nausea  and vomiting.  Genitourinary: Negative.   Musculoskeletal: Negative.   Skin: Negative.   Neurological: Positive for headaches. Negative for dizziness and loss of consciousness.   Physical Exam   Blood pressure 130/66, pulse 98, temperature 98.4 F (36.9 C), temperature source Oral, resp. rate 16, height 5' 2.25" (1.581 m), weight 89.359 kg (197 lb), last menstrual period 08/20/2011, SpO2 100.00%.  Physical Exam  Constitutional: She is oriented to person, place, and time. She appears well-developed and well-nourished.  HENT:  Head: Normocephalic and atraumatic.  Neck: Normal range of motion. Neck supple.  Cardiovascular: Normal rate, regular rhythm and normal heart sounds.   Respiratory: Effort normal and breath sounds normal. No respiratory distress.  GI: Soft. There is tenderness.       Tenderness noted on moderately deep palpation to LUQ in epigastric/diaphragmatic area.    Neurological: She is alert and oriented to person, place, and time.  Skin: Skin is warm and dry.  Psychiatric: Her behavior is normal.       Noted old scars on patients L forearm.  Asked about a history of cutting.  Patient admits she used to cut, but insists it is under control and hasn't "cut for years".      MAU Course  Procedures  MDM Results for orders placed during the hospital encounter of 04/13/12 (from the past 24 hour(s))  URINALYSIS, ROUTINE W REFLEX MICROSCOPIC     Status: Normal   Collection Time   04/13/12  6:00 PM      Component Value Range   Color, Urine YELLOW  YELLOW    APPearance CLEAR  CLEAR    Specific Gravity, Urine 1.015  1.005 - 1.030    pH 6.0  5.0 - 8.0    Glucose, UA NEGATIVE  NEGATIVE (mg/dL)   Hgb urine dipstick NEGATIVE  NEGATIVE    Bilirubin Urine NEGATIVE  NEGATIVE    Ketones, ur NEGATIVE  NEGATIVE (mg/dL)   Protein, ur NEGATIVE  NEGATIVE (mg/dL)   Urobilinogen, UA 0.2  0.0 - 1.0 (mg/dL)   Nitrite NEGATIVE  NEGATIVE    Leukocytes, UA NEGATIVE  NEGATIVE    Consult with Dr. Despina Hidden and discussed plan of care.  Assessment and Plan   A: Diarrhea  P:  No smoking, no drugs, no alcohol.  Take a prenatal vitamin one by mouth every day.  Eat small frequent snacks to avoid nausea.  Begin prenatal care as soon as possible.  Use imodium, pepto-bismol, and Tums for abdominal discomfort and diarrhea.  May try taking probiotics for women daily to assist with resolution of diarrhea.  No food or liquid at least 2 hours before laying down to sleep.  If diarrhea or abdominal pain worsens, please contact your physician.  Keep your appointment as scheduled next week.    I agree with exam and documentation.  Nolene Bernheim, RN, FNP  Servando Salina 04/13/2012, 7:17 PM

## 2012-04-13 NOTE — Discharge Instructions (Signed)
No smoking, no drugs, no alcohol.  Take a prenatal vitamin one by mouth every day.  Eat small frequent snacks to avoid nausea.  Use imodium, pepto-bismol, and Tums for abdominal discomfort and diarrhea.  May try taking probiotics for women daily to assist with resolution of diarrhea.  No food or liquid at least 2 hours before laying down to sleep.  If diarrhea or abdominal pain worsens, please contact your physician.  Keep your appointment as scheduled next week.

## 2012-05-02 ENCOUNTER — Encounter (HOSPITAL_COMMUNITY): Payer: Self-pay | Admitting: *Deleted

## 2012-05-02 ENCOUNTER — Inpatient Hospital Stay (HOSPITAL_COMMUNITY)
Admission: AD | Admit: 2012-05-02 | Discharge: 2012-05-02 | Disposition: A | Discharge status: Home / Self Care | Payer: Medicaid Other | Source: Ambulatory Visit | Attending: Obstetrics & Gynecology | Admitting: Obstetrics & Gynecology

## 2012-05-02 DIAGNOSIS — O4700 False labor before 37 completed weeks of gestation, unspecified trimester: Secondary | ICD-10-CM | POA: Insufficient documentation

## 2012-05-02 DIAGNOSIS — O479 False labor, unspecified: Secondary | ICD-10-CM

## 2012-05-02 LAB — URINALYSIS, ROUTINE W REFLEX MICROSCOPIC
Bilirubin Urine: NEGATIVE
Hgb urine dipstick: NEGATIVE
Nitrite: NEGATIVE
Specific Gravity, Urine: <1.005 — ABNORMAL LOW (ref 1.005–1.030)
pH: 6 (ref 5.0–8.0)

## 2012-05-02 LAB — FETAL FIBRONECTIN: Fetal Fibronectin: NEGATIVE

## 2012-05-02 MED ORDER — LACTATED RINGERS IV BOLUS (SEPSIS)
1000.0000 mL | Freq: Once | INTRAVENOUS | Status: AC
  Administered 2012-05-02: 1000 mL via INTRAVENOUS

## 2012-05-02 MED ORDER — NIFEDIPINE 10 MG PO CAPS
10.0000 mg | ORAL_CAPSULE | Freq: Once | ORAL | Status: AC
  Administered 2012-05-02: 10 mg via ORAL
  Filled 2012-05-02: qty 1

## 2012-05-02 MED ORDER — TERBUTALINE SULFATE 1 MG/ML IJ SOLN
0.2500 mg | Freq: Once | INTRAMUSCULAR | Status: DC
  Filled 2012-05-02: qty 1

## 2012-05-02 MED ORDER — BUTORPHANOL TARTRATE 2 MG/ML IJ SOLN
1.0000 mg | Freq: Once | INTRAMUSCULAR | Status: AC
  Administered 2012-05-02: 1 mg via INTRAMUSCULAR
  Filled 2012-05-02: qty 1

## 2012-05-02 MED ORDER — BUTORPHANOL TARTRATE 2 MG/ML IJ SOLN
1.0000 mg | Freq: Once | INTRAMUSCULAR | Status: AC
  Administered 2012-05-02: 1 mg via INTRAVENOUS
  Filled 2012-05-02: qty 1

## 2012-05-02 NOTE — MAU Note (Signed)
Patient offered terbutaline to help with contractions. Patient refused stating she wants to go home and does not want to be monitor to see if medication is effective. Patient discharged home with preterm labor precautions. Patient verbalizes understanding of signs and symptoms to report to MD and or return to hospital.

## 2012-05-02 NOTE — MAU Provider Note (Signed)
  History     CSN: [id number]  Arrival date and time: 05/02/12 0048   None     Chief Complaint  Patient presents with  . Contractions   HPI  Pt reports contractions that started last night.  Denies vaginal bleeding or leaking of fluid.  No report of sexual intercourse in 48 hours.  Denies problems in pregnancy.  Past Medical History  Diagnosis Date  . Seizures   . Anxiety   . Depression   . Bipolar 1 disorder   . ADHD (attention deficit hyperactivity disorder)   . Pregnant     History reviewed. No pertinent past surgical history.  Family History  Problem Relation Age of Onset  . Cancer Other   . Seizures Other   . Stroke Other   . Cancer Mother   . Diabetes Mother   . Hypertension Mother   . Cancer Father   . Diabetes Father   . Hypertension Father     History  Substance Use Topics  . Smoking status: Current Everyday Smoker -- 0.5 packs/day for 10 years    Types: Cigarettes  . Smokeless tobacco: Never Used  . Alcohol Use: No    Allergies:  Allergies  Allergen Reactions  . Iohexol Anaphylaxis and Hives    difficulty breathing, felt like throat was closing up, hives. Pt was given benadryl prior to ct for other reasons. after going back to er. was not given any other treatment before going home   . Penicillins Other (See Comments)    Patient states that she has convulsions.  . Sulfa Antibiotics Other (See Comments)    Patient states that she has convulsions.    Prescriptions prior to admission  Medication Sig Dispense Refill  . acetaminophen (TYLENOL) 325 MG tablet Take 650 mg by mouth every 6 (six) hours as needed. For pain       . acetaminophen-codeine (TYLENOL #3) 300-30 MG per tablet Take 1 tablet by mouth.      . diphenhydrAMINE (BENADRYL) 25 mg capsule Take 25 mg by mouth every 6 (six) hours as needed. For allergies      . omeprazole (PRILOSEC) 20 MG capsule Take 40 mg by mouth daily.        . Prenatal Vit-Fe Fumarate-FA (PRENATAL MULTIVITAMIN)  60-1 MG tablet Take 1 tablet by mouth daily.  30 tablet  0    Review of Systems  Gastrointestinal: Positive for abdominal pain (contractions).  All other systems reviewed and are negative.   Physical Exam   Blood pressure 133/72, pulse 89, temperature 98.7 F (37.1 C), temperature source Oral, resp. rate 20, height 5\' 3"  (1.6 m), weight 89.812 kg (198 lb), last menstrual period 08/20/2011, SpO2 100.00%.  Physical Exam  Constitutional: She is oriented to person, place, and time. She appears well-developed and well-nourished.       Appears uncomfortable  HENT:  Head: Normocephalic.  Neck: Normal range of motion. Neck supple.  Cardiovascular: Normal rate, regular rhythm and normal heart sounds.   Respiratory: Effort normal and breath sounds normal.  Genitourinary: No bleeding around the vagina. Vaginal discharge (mucusy) found.       1/thick/high  Musculoskeletal: Normal range of motion. She exhibits no edema.  Neurological: She is alert and oriented to person, place, and time.  Skin: Skin is warm and dry.    MAU Course  Procedures    Assessment and Plan  Braxton Hicks  Plan: DC to home Labor precautions  [facility] 05/02/2012, 2:11 AM

## 2012-05-02 NOTE — MAU Note (Signed)
Pt presents with contractions x 3 hours , denies bleeding or ROM

## 2012-05-02 NOTE — Discharge Instructions (Signed)

## 2012-05-05 ENCOUNTER — Encounter (HOSPITAL_COMMUNITY): Payer: Self-pay | Admitting: *Deleted

## 2012-05-05 ENCOUNTER — Inpatient Hospital Stay (HOSPITAL_COMMUNITY)
Admission: AD | Admit: 2012-05-05 | Discharge: 2012-05-06 | Disposition: A | Discharge status: Home / Self Care | Payer: Medicaid Other | Source: Ambulatory Visit | Attending: Obstetrics & Gynecology | Admitting: Obstetrics & Gynecology

## 2012-05-05 DIAGNOSIS — O479 False labor, unspecified: Secondary | ICD-10-CM

## 2012-05-05 DIAGNOSIS — O4700 False labor before 37 completed weeks of gestation, unspecified trimester: Secondary | ICD-10-CM | POA: Insufficient documentation

## 2012-05-05 NOTE — MAU Note (Signed)
PT SAYS SHE STARTED HAVING UC  ON WED NIGHT.  PT WAS HERE ON Sunday-  VE 1 CM.     DENIES HSV AND MRSA

## 2012-05-06 DIAGNOSIS — O479 False labor, unspecified: Secondary | ICD-10-CM

## 2012-05-06 NOTE — Discharge Instructions (Signed)

## 2012-05-06 NOTE — MAU Provider Note (Signed)
  History     CSN: [id number]  Arrival date and time: 05/05/12 2311   None     No chief complaint on file.  HPI 26 yo G3P1011 at 34.2 weeks seen for contractions that started earlier today.  Feels moderate contractions that are approximately every 4-5 minutes.  Denies decreased fetal activity, vaginal discharge, vaginal bleeding.  OB History    Grav Para Term Preterm Abortions TAB SAB Ect Mult Living   3 1 1  1 1    1       Past Medical History  Diagnosis Date  . Seizures   . Anxiety   . Depression   . Bipolar 1 disorder   . ADHD (attention deficit hyperactivity disorder)   . Pregnant     History reviewed. No pertinent past surgical history.  Family History  Problem Relation Age of Onset  . Cancer Other   . Seizures Other   . Stroke Other   . Cancer Mother   . Diabetes Mother   . Hypertension Mother   . Cancer Father   . Diabetes Father   . Hypertension Father     History  Substance Use Topics  . Smoking status: Current Everyday Smoker -- 0.5 packs/day for 10 years    Types: Cigarettes  . Smokeless tobacco: Never Used  . Alcohol Use: No    Allergies:  Allergies  Allergen Reactions  . Iohexol Anaphylaxis and Hives    difficulty breathing, felt like throat was closing up, hives. Pt was given benadryl prior to ct for other reasons. after going back to er. was not given any other treatment before going home   . Penicillins Other (See Comments)    Patient states that she has convulsions.  . Sulfa Antibiotics Other (See Comments)    Patient states that she has convulsions.    Prescriptions prior to admission  Medication Sig Dispense Refill  . acetaminophen-codeine (TYLENOL #3) 300-30 MG per tablet Take 1 tablet by mouth.      . diphenhydrAMINE (BENADRYL) 25 mg capsule Take 25 mg by mouth every 6 (six) hours as needed. For allergies      . omeprazole (PRILOSEC) 20 MG capsule Take 40 mg by mouth daily.        . Prenatal Vit-Fe Fumarate-FA (PRENATAL  MULTIVITAMIN) 60-1 MG tablet Take 1 tablet by mouth daily.  30 tablet  0    ROS Physical Exam   Blood pressure 116/66, pulse 89, temperature 98.6 F (37 C), temperature source Oral, resp. rate 20, height 5\' 3"  (1.6 m), weight 90.266 kg (199 lb), last menstrual period 08/20/2011.  Physical Exam  Constitutional: She is oriented to person, place, and time. She appears well-developed and well-nourished.  GI: Soft. She exhibits no distension and no mass. There is no tenderness. There is no rebound and no guarding.  Musculoskeletal: Normal range of motion.  Neurological: She is alert and oriented to person, place, and time.  Skin: Skin is warm and dry.  Psychiatric: She has a normal mood and affect. Her behavior is normal. Judgment and thought content normal.   Dilation: 1 Presentation: Vertex Exam by:: DR Adrian Blackwater   MAU Course  Procedures NST: category 1 tracing.  Accelerations seen.  MDM Same cervical dilation as Sunday.  Patient not in labor.  Assessment and Plan  1.  Braxton hicks contractions  Labor precautions given.  Patient discharged to home.  Will follow up with [facility].  Gerardine Peltz JEHIEL 05/06/2012, 12:57 AM

## 2012-05-07 ENCOUNTER — Encounter (HOSPITAL_COMMUNITY): Payer: Self-pay | Admitting: Family

## 2012-05-07 ENCOUNTER — Inpatient Hospital Stay (HOSPITAL_COMMUNITY)
Admission: AD | Admit: 2012-05-07 | Discharge: 2012-05-07 | Disposition: A | Discharge status: Home / Self Care | Payer: Medicaid Other | Source: Ambulatory Visit | Attending: Obstetrics & Gynecology | Admitting: Obstetrics & Gynecology

## 2012-05-07 DIAGNOSIS — O479 False labor, unspecified: Secondary | ICD-10-CM

## 2012-05-07 DIAGNOSIS — O4700 False labor before 37 completed weeks of gestation, unspecified trimester: Secondary | ICD-10-CM | POA: Insufficient documentation

## 2012-05-07 LAB — URINALYSIS, ROUTINE W REFLEX MICROSCOPIC
Ketones, ur: NEGATIVE mg/dL
Leukocytes, UA: NEGATIVE
Nitrite: NEGATIVE
Protein, ur: NEGATIVE mg/dL
Urobilinogen, UA: 0.2 mg/dL (ref 0.0–1.0)
pH: 6 (ref 5.0–8.0)

## 2012-05-07 LAB — AMNISURE RUPTURE OF MEMBRANE (ROM) NOT AT ARMC: Amnisure ROM: NEGATIVE

## 2012-05-07 MED ORDER — NIFEDIPINE 10 MG PO CAPS: 10.0000 mg | ORAL_CAPSULE | Freq: Once | ORAL | Status: DC

## 2012-05-07 NOTE — MAU Provider Note (Signed)
Attestation of Attending Supervision of Advanced Practitioner (CNM/NP): Evaluation and management procedures were performed by the Advanced Practitioner under my supervision and collaboration.  I have reviewed the Advanced Practitioner's note and chart, and I agree with the management and plan.  Jaynie Collins, M.D. 05/07/2012 11:16 PM

## 2012-05-07 NOTE — Discharge Instructions (Signed)

## 2012-05-07 NOTE — MAU Provider Note (Signed)
History     CSN: [id number]  Arrival date and time: 05/07/12 2113   None     Chief Complaint  Patient presents with  . Vaginal Discharge   HPI  Pt here with report of watery discharge that started this morning.  Reports intermittent contractions, no bleeding.  +fetal movement.  No intercourse "in a long time".  Past Medical History  Diagnosis Date  . Seizures   . Anxiety   . Depression   . Bipolar 1 disorder   . ADHD (attention deficit hyperactivity disorder)   . Pregnant     History reviewed. No pertinent past surgical history.  Family History  Problem Relation Age of Onset  . Cancer Other   . Seizures Other   . Stroke Other   . Cancer Mother   . Diabetes Mother   . Hypertension Mother   . Cancer Father   . Diabetes Father   . Hypertension Father     History  Substance Use Topics  . Smoking status: Current Everyday Smoker -- 0.5 packs/day for 10 years    Types: Cigarettes  . Smokeless tobacco: Never Used  . Alcohol Use: No    Allergies:  Allergies  Allergen Reactions  . Iohexol Anaphylaxis and Hives    difficulty breathing, felt like throat was closing up, hives. Pt was given benadryl prior to ct for other reasons. after going back to er. was not given any other treatment before going home   . Penicillins Other (See Comments)    Patient states that she has convulsions.  . Sulfa Antibiotics Other (See Comments)    Patient states that she has convulsions.    Prescriptions prior to admission  Medication Sig Dispense Refill  . acetaminophen-codeine (TYLENOL #3) 300-30 MG per tablet Take 1 tablet by mouth.      . diphenhydrAMINE (BENADRYL) 25 mg capsule Take 25 mg by mouth every 6 (six) hours as needed. For allergies      . omeprazole (PRILOSEC) 20 MG capsule Take 40 mg by mouth daily.        . Prenatal Vit-Fe Fumarate-FA (PRENATAL MULTIVITAMIN) 60-1 MG tablet Take 1 tablet by mouth daily.  30 tablet  0    Review of Systems  Gastrointestinal:  Positive for abdominal pain (intermittent contractions).  Genitourinary:       Vaginal discharge all day  All other systems reviewed and are negative.   Physical Exam   Last menstrual period 08/20/2011.  Physical Exam  Constitutional: She is oriented to person, place, and time. She appears well-developed and well-nourished.  HENT:  Head: Normocephalic.  Neck: Normal range of motion. Neck supple.  Cardiovascular: Normal rate, regular rhythm and normal heart sounds.   Respiratory: Effort normal and breath sounds normal.  GI: Soft. There is no tenderness.  Genitourinary: No bleeding around the vagina. Vaginal discharge (mucusy) found.       Pooling negative; fern negative; cervix - FT/hi  Neurological: She is alert and oriented to person, place, and time.  Skin: Skin is warm and dry.    MAU Course  Procedures  Results for orders placed during the hospital encounter of 05/07/12 (from the past 24 hour(s))  URINALYSIS, ROUTINE W REFLEX MICROSCOPIC     Status: Normal   Collection Time   05/07/12 10:04 PM      Component Value Range   Color, Urine YELLOW  YELLOW   APPearance CLEAR  CLEAR   Specific Gravity, Urine 1.015  1.005 - 1.030   pH  6.0  5.0 - 8.0   Glucose, UA NEGATIVE  NEGATIVE mg/dL   Hgb urine dipstick NEGATIVE  NEGATIVE   Bilirubin Urine NEGATIVE  NEGATIVE   Ketones, ur NEGATIVE  NEGATIVE mg/dL   Protein, ur NEGATIVE  NEGATIVE mg/dL   Urobilinogen, UA 0.2  0.0 - 1.0 mg/dL   Nitrite NEGATIVE  NEGATIVE   Leukocytes, UA NEGATIVE  NEGATIVE  AMNISURE RUPTURE OF MEMBRANE (ROM)     Status: Normal   Collection Time   05/07/12 10:05 PM      Component Value Range   Amnisure ROM NEGATIVE     Cervix remains unchanged from previous visits to MAU FHR 130's, +accels, reactive Toco - 2-6/60  Pt declines Procardia and desires discharge home  Assessment and Plan  Braxton Hicks  Plan: DC to home Preterm labor precautions. Keep scheduled  appt [facility]   [facility] 05/07/2012, 9:32 PM

## 2012-05-10 ENCOUNTER — Other Ambulatory Visit: Payer: Self-pay | Admitting: Obstetrics and Gynecology

## 2012-05-20 ENCOUNTER — Encounter (HOSPITAL_COMMUNITY): Payer: Self-pay

## 2012-05-20 ENCOUNTER — Inpatient Hospital Stay (HOSPITAL_COMMUNITY)
Admission: AD | Admit: 2012-05-20 | Discharge: 2012-05-21 | Disposition: A | Discharge status: Home / Self Care | Payer: Medicaid Other | Source: Ambulatory Visit | Attending: Obstetrics and Gynecology | Admitting: Obstetrics and Gynecology

## 2012-05-20 DIAGNOSIS — O4700 False labor before 37 completed weeks of gestation, unspecified trimester: Secondary | ICD-10-CM | POA: Insufficient documentation

## 2012-05-20 HISTORY — DX: Gastro-esophageal reflux disease without esophagitis: K21.9

## 2012-05-20 LAB — URINALYSIS, ROUTINE W REFLEX MICROSCOPIC
Bilirubin Urine: NEGATIVE
Hgb urine dipstick: NEGATIVE
Ketones, ur: 15 mg/dL — AB
Nitrite: NEGATIVE
Protein, ur: NEGATIVE mg/dL
Urobilinogen, UA: 0.2 mg/dL (ref 0.0–1.0)

## 2012-05-20 MED ORDER — LACTATED RINGERS IV BOLUS (SEPSIS)
1000.0000 mL | Freq: Once | INTRAVENOUS | Status: AC
  Administered 2012-05-20: 1000 mL via INTRAVENOUS

## 2012-05-20 MED ORDER — NALBUPHINE HCL 10 MG/ML IJ SOLN
10.0000 mg | INTRAMUSCULAR | Status: AC
  Administered 2012-05-20: 10 mg via INTRAVENOUS
  Filled 2012-05-20: qty 1

## 2012-05-20 NOTE — MAU Provider Note (Signed)
Chief Complaint:  Contractions   None     HPI  NIL [name] is a 26 y.o. G3P1011 at [redacted]w[redacted]d presenting with contractions. She reports good fetal movement, denies LOF, vaginal bleeding, vaginal itching/burning, urinary symptoms, h/a, dizziness, n/v, or fever/chills.   Pt reports some bright red spotting when she wipes after cervical checks in MAU.    Pregnancy Course: uncomplicated  Past Medical History: Past Medical History  Diagnosis Date  . Seizures   . Anxiety   . Depression   . Bipolar 1 disorder   . ADHD (attention deficit hyperactivity disorder)   . Pregnant   . GERD (gastroesophageal reflux disease)     Past Surgical History: History reviewed. No pertinent past surgical history.  Family History: Family History  Problem Relation Age of Onset  . Cancer Other   . Seizures Other   . Stroke Other   . Cancer Mother   . Diabetes Mother   . Hypertension Mother   . Cancer Father   . Diabetes Father   . Hypertension Father     Social History: History  Substance Use Topics  . Smoking status: Current Everyday Smoker -- 0.5 packs/day for 10 years    Types: Cigarettes  . Smokeless tobacco: Never Used  . Alcohol Use: No    Allergies:  Allergies  Allergen Reactions  . Iohexol Anaphylaxis and Hives    difficulty breathing, felt like throat was closing up, hives. Pt was given benadryl prior to ct for other reasons. after going back to er. was not given any other treatment before going home   . Penicillins Other (See Comments)    Patient states that she has convulsions.  . Sulfa Antibiotics Other (See Comments)    Patient states that she has convulsions.    Meds:  Prescriptions prior to admission  Medication Sig Dispense Refill  . acetaminophen (TYLENOL) 325 MG tablet Take 650 mg by mouth every 6 (six) hours as needed. For headaches.      Marland Kitchen acetaminophen-codeine (TYLENOL #3) 300-30 MG per tablet Take 2 tablets by mouth daily as needed. pain      .  diphenhydrAMINE (BENADRYL) 25 mg capsule Take 25 mg by mouth every 6 (six) hours as needed. For allergies      . omeprazole (PRILOSEC) 20 MG capsule Take 40 mg by mouth daily.        . Prenatal Vit-Fe Fumarate-FA (PRENATAL MULTIVITAMIN) 60-1 MG tablet Take 1 tablet by mouth daily.  30 tablet  0  . venlafaxine (EFFEXOR) 37.5 MG tablet Take 37.5 mg by mouth daily.          Physical Exam  Blood pressure 127/81, pulse 87, temperature 98.2 F (36.8 C), temperature source Oral, resp. rate 18, height 5\' 3"  (1.6 m), weight 90.379 kg (199 lb 4 oz), last menstrual period 08/20/2011. GENERAL: Well-developed, well-nourished female in no acute distress.  HEENT: normocephalic, good dentition HEART: normal rate RESP: normal effort ABDOMEN: Soft, nontender, gravid appropriate for gestational age EXTREMITIES: Nontender, no edema NEURO: alert and oriented   Dilation: 1.5 Effacement (%): 60 Cervical Position: Posterior Station: -3 Presentation: Vertex Exam by:: Peace, rn   FHR 145 with moderate variability, positive accels, negative decels--Category I  Toco: Ctx 4-5 in 10 minutes, lasting 70 seconds  Labs: Results for orders placed during the hospital encounter of 05/20/12 (from the past 24 hour(s))  URINALYSIS, ROUTINE W REFLEX MICROSCOPIC     Status: Abnormal   Collection Time   05/20/12  9:03 PM  Component Value Range   Color, Urine YELLOW  YELLOW   APPearance CLEAR  CLEAR   Specific Gravity, Urine 1.015  1.005 - 1.030   pH 6.0  5.0 - 8.0   Glucose, UA NEGATIVE  NEGATIVE mg/dL   Hgb urine dipstick NEGATIVE  NEGATIVE   Bilirubin Urine NEGATIVE  NEGATIVE   Ketones, ur 15 (*) NEGATIVE mg/dL   Protein, ur NEGATIVE  NEGATIVE mg/dL   Urobilinogen, UA 0.2  0.0 - 1.0 mg/dL   Nitrite NEGATIVE  NEGATIVE   Leukocytes, UA TRACE (*) NEGATIVE  URINE MICROSCOPIC-ADD ON     Status: Abnormal   Collection Time   05/20/12  9:03 PM      Component Value Range   Squamous Epithelial / LPF FEW (*)  RARE   WBC, UA 0-2  <3 WBC/hpf   RBC / HPF 0-2  <3 RBC/hpf   Bacteria, UA FEW (*) RARE     Assessment: Threatened late preterm labor   Plan: LR 1000 ml bolus x1 Nubain 10 mg IV x1  D/C home with labor precautions Pt refuses Ambien for therapeutic rest, reporting she does not like how she feels when taking it. Percocet 5/325, 2 tabs for therapeutic rest F/U with prenatal provider  LEFTWICH-KIRBY, Cleatis Fandrich 6/29/201312:08 AM

## 2012-05-20 NOTE — MAU Note (Signed)
Patient is here with c/o ctx q3-5 since 1800. She denies any vaginal bleeding or lof. Reports good fetal movement.

## 2012-05-20 NOTE — MAU Note (Signed)
Pt states, " I've had contractions since this morning, but they got stronger and closer at 6:30 pm."

## 2012-05-21 MED ORDER — OXYCODONE-ACETAMINOPHEN 5-325 MG PO TABS
2.0000 | ORAL_TABLET | ORAL | Status: AC
  Administered 2012-05-21: 2 via ORAL
  Filled 2012-05-21: qty 2

## 2012-05-22 ENCOUNTER — Observation Stay (HOSPITAL_COMMUNITY)
Admission: AD | Admit: 2012-05-22 | Discharge: 2012-05-23 | Disposition: A | Discharge status: Home / Self Care | Payer: Medicaid Other | Source: Ambulatory Visit | Attending: Obstetrics and Gynecology | Admitting: Obstetrics and Gynecology

## 2012-05-22 ENCOUNTER — Encounter (HOSPITAL_COMMUNITY): Payer: Self-pay | Admitting: Obstetrics and Gynecology

## 2012-05-22 ENCOUNTER — Inpatient Hospital Stay (HOSPITAL_COMMUNITY): Payer: Medicaid Other

## 2012-05-22 ENCOUNTER — Encounter (HOSPITAL_COMMUNITY): Payer: Self-pay

## 2012-05-22 ENCOUNTER — Inpatient Hospital Stay (HOSPITAL_COMMUNITY)
Admission: EM | Admit: 2012-05-22 | Discharge: 2012-05-22 | Disposition: A | Discharge status: Home / Self Care | Payer: Medicaid Other | Source: Home / Self Care | Attending: Emergency Medicine | Admitting: Emergency Medicine

## 2012-05-22 DIAGNOSIS — G40909 Epilepsy, unspecified, not intractable, without status epilepticus: Principal | ICD-10-CM | POA: Insufficient documentation

## 2012-05-22 DIAGNOSIS — F445 Conversion disorder with seizures or convulsions: Secondary | ICD-10-CM

## 2012-05-22 DIAGNOSIS — O4700 False labor before 37 completed weeks of gestation, unspecified trimester: Secondary | ICD-10-CM | POA: Insufficient documentation

## 2012-05-22 DIAGNOSIS — O479 False labor, unspecified: Secondary | ICD-10-CM

## 2012-05-22 LAB — URINE MICROSCOPIC-ADD ON

## 2012-05-22 LAB — URINALYSIS, ROUTINE W REFLEX MICROSCOPIC
Nitrite: NEGATIVE
Specific Gravity, Urine: 1.025 (ref 1.005–1.030)
pH: 6 (ref 5.0–8.0)

## 2012-05-22 LAB — COMPREHENSIVE METABOLIC PANEL
AST: 13 U/L (ref 0–37)
Albumin: 2.9 g/dL — ABNORMAL LOW (ref 3.5–5.2)
CO2: 22 mEq/L (ref 19–32)
Calcium: 8.8 mg/dL (ref 8.4–10.5)
Creatinine, Ser: 0.5 mg/dL (ref 0.50–1.10)
GFR calc non Af Amer: >90 mL/min (ref >90)

## 2012-05-22 LAB — CBC
HCT: 34.1 % — ABNORMAL LOW (ref 36.0–46.0)
MCHC: 33.4 g/dL (ref 30.0–36.0)
MCV: 87.9 fL (ref 78.0–100.0)
RDW: 14.5 % (ref 11.5–15.5)

## 2012-05-22 LAB — PROTEIN / CREATININE RATIO, URINE
Protein Creatinine Ratio: 0.1 (ref 0.00–0.15)
Total Protein, Urine: 12.2 mg/dL

## 2012-05-22 LAB — RAPID URINE DRUG SCREEN, HOSP PERFORMED: Barbiturates: NOT DETECTED

## 2012-05-22 LAB — SAMPLE TO BLOOD BANK

## 2012-05-22 MED ORDER — LORAZEPAM 2 MG/ML IJ SOLN
0.5000 mg | Freq: Once | INTRAMUSCULAR | Status: AC
  Administered 2012-05-22: 0.5 mg via INTRAVENOUS

## 2012-05-22 MED ORDER — DOCUSATE SODIUM 100 MG PO CAPS
100.0000 mg | ORAL_CAPSULE | Freq: Every day | ORAL | Status: DC
  Filled 2012-05-22: qty 1

## 2012-05-22 MED ORDER — NALBUPHINE SYRINGE 5 MG/0.5 ML
10.0000 mg | INJECTION | INTRAMUSCULAR | Status: AC
  Administered 2012-05-22: 10 mg via INTRAVENOUS
  Filled 2012-05-22: qty 1

## 2012-05-22 MED ORDER — LORAZEPAM 2 MG/ML IJ SOLN
INTRAMUSCULAR | Status: AC
  Administered 2012-05-22: 0.5 mg via INTRAVENOUS
  Filled 2012-05-22: qty 1

## 2012-05-22 MED ORDER — NALBUPHINE SYRINGE 5 MG/0.5 ML
10.0000 mg | INJECTION | Freq: Once | INTRAMUSCULAR | Status: AC
  Administered 2012-05-22: 10 mg via INTRAVENOUS
  Filled 2012-05-22: qty 1

## 2012-05-22 MED ORDER — HYDROMORPHONE HCL PF 1 MG/ML IJ SOLN
1.0000 mg | Freq: Once | INTRAMUSCULAR | Status: AC
  Administered 2012-05-22: 1 mg via INTRAVENOUS
  Filled 2012-05-22: qty 1

## 2012-05-22 MED ORDER — HYDROMORPHONE HCL 2 MG PO TABS
2.0000 mg | ORAL_TABLET | ORAL | Status: DC | PRN
  Administered 2012-05-22 – 2012-05-23 (×2): 2 mg via ORAL
  Filled 2012-05-22 (×2): qty 1

## 2012-05-22 MED ORDER — ACETAMINOPHEN 325 MG PO TABS: 650.0000 mg | ORAL_TABLET | ORAL | Status: DC | PRN

## 2012-05-22 MED ORDER — LEVETIRACETAM 500 MG PO TABS
500.0000 mg | ORAL_TABLET | Freq: Once | ORAL | Status: DC
  Filled 2012-05-22: qty 1

## 2012-05-22 MED ORDER — PRENATAL MULTIVITAMIN CH
1.0000 | ORAL_TABLET | Freq: Every day | ORAL | Status: DC
  Administered 2012-05-22: 1 via ORAL
  Filled 2012-05-22: qty 1

## 2012-05-22 MED ORDER — ZOLPIDEM TARTRATE 5 MG PO TABS: 5.0000 mg | ORAL_TABLET | Freq: Every evening | ORAL | Status: DC | PRN

## 2012-05-22 MED ORDER — LEVETIRACETAM 500 MG PO TABS
500.0000 mg | ORAL_TABLET | Freq: Two times a day (BID) | ORAL | Status: DC
  Administered 2012-05-23: 500 mg via ORAL
  Filled 2012-05-22: qty 1

## 2012-05-22 MED ORDER — SODIUM CHLORIDE 0.9 % IV SOLN
500.0000 mg | Freq: Once | INTRAVENOUS | Status: AC
  Administered 2012-05-22: 500 mg via INTRAVENOUS
  Filled 2012-05-22: qty 5

## 2012-05-22 MED ORDER — SODIUM CHLORIDE 0.9 % IV SOLN
INTRAVENOUS | Status: DC
  Administered 2012-05-22 – 2012-05-23 (×2): via INTRAVENOUS

## 2012-05-22 MED ORDER — CALCIUM CARBONATE ANTACID 500 MG PO CHEW: 2.0000 | CHEWABLE_TABLET | ORAL | Status: DC | PRN

## 2012-05-22 MED ORDER — OXYCODONE-ACETAMINOPHEN 5-325 MG PO TABS: 2.0000 | ORAL_TABLET | ORAL | Status: DC

## 2012-05-22 MED ORDER — SODIUM CHLORIDE 0.9 % IV SOLN: INTRAVENOUS | Status: DC
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
## 2012-05-22 NOTE — ED Notes (Signed)
Pt had another seizure about 10-15 sec.  A/A O and resposive right after.  Ativan order per Dr. Gar Ponto and given

## 2012-05-22 NOTE — Progress Notes (Addendum)
Pt. Has seizure: eyes closed -  head turns upward and  to left side, left arm and hand draws close to body, feet turns inward, pillows placed around patient. Seizure last 5-10 sec.  FHR 120s.

## 2012-05-22 NOTE — ED Notes (Signed)
[facility] transport to [facility]

## 2012-05-22 NOTE — Progress Notes (Signed)
walidah cnm notified of patient being uncomfortable and wanting pain medicine, ctx pattern and sve result. Will come to see patient in [place].

## 2012-05-22 NOTE — Progress Notes (Signed)
Pt to [facility] c/o ucs and back pain.  Pt reports no bleeding or leaking of fluid and (+) FM.  Pt reports being @ [facility] on Friday for labor eval and cervix was 1/60.

## 2012-05-22 NOTE — ED Provider Notes (Addendum)
History     CSN: [id number]  Arrival date & time 05/22/12  4098   First MD Initiated Contact with Patient 05/22/12 [phone]      Chief Complaint  Patient presents with  . Contractions    (Consider location/radiation/quality/duration/timing/severity/associated sxs/prior treatment) HPI SUI [name] is a 26 y.o. female  G3, P1, 36 weeks, 4 days who presents to the Emergency Department complaining of contractions that began this morning.She was seen at [facility] Friday night and monitored until 2 AM Saturday morning. She was 1 cm dilated and 60% effaced.  She reports  probable loss of mucus plug, good fetal movement, no LOF, vaginal bleeding, vaginal itching/burning, urinary symptoms,fever/chills.  h/a, dizziness, n/v.  OB/GYN Dr. Emelda Fear      Past Medical History  Diagnosis Date  . Seizures   . Anxiety   . Depression   . Bipolar 1 disorder   . ADHD (attention deficit hyperactivity disorder)   . Pregnant   . GERD (gastroesophageal reflux disease)     No past surgical history on file.  Family History  Problem Relation Age of Onset  . Cancer Other   . Seizures Other   . Stroke Other   . Cancer Mother   . Diabetes Mother   . Hypertension Mother   . Cancer Father   . Diabetes Father   . Hypertension Father     History  Substance Use Topics  . Smoking status: Current Everyday Smoker -- 0.5 packs/day for 10 years    Types: Cigarettes  . Smokeless tobacco: Never Used  . Alcohol Use: No    OB History    Grav Para Term Preterm Abortions TAB SAB Ect Mult Living   3 1 1  1 1    1       Review of Systems  Constitutional: Negative for fever.       10 Systems reviewed and are negative for acute change except as noted in the HPI.  HENT: Negative for congestion.   Eyes: Negative for discharge and redness.  Respiratory: Negative for cough and shortness of breath.   Cardiovascular: Negative for chest pain.  Gastrointestinal: Negative for vomiting and abdominal pain.    Musculoskeletal: Negative for back pain.  Skin: Negative for rash.  Neurological: Negative for syncope, numbness and headaches.  Psychiatric/Behavioral:       No behavior change.    Allergies  Iohexol; Penicillins; and Sulfa antibiotics  Home Medications   Current Outpatient Rx  Name Route Sig Dispense Refill  . ACETAMINOPHEN 325 MG PO TABS Oral Take 650 mg by mouth every 6 (six) hours as needed. For headaches.    . OMEPRAZOLE 20 MG PO CPDR Oral Take 40 mg by mouth daily.      Marland Kitchen PRENATAL RX 60-1 MG PO TABS Oral Take 1 tablet by mouth daily. 30 tablet 0  . VENLAFAXINE HCL 37.5 MG PO TABS Oral Take 37.5 mg by mouth daily.    . ACETAMINOPHEN-CODEINE #3 300-30 MG PO TABS Oral Take 2 tablets by mouth daily as needed. pain    . DIPHENHYDRAMINE HCL 25 MG PO CAPS Oral Take 25 mg by mouth every 6 (six) hours as needed. For allergies      BP 130/86  Pulse 76  Temp 98.1 F (36.7 C) (Oral)  Resp 20  Ht 5\' 3"  (1.6 m)  Wt 199 lb (90.266 kg)  BMI 35.25 kg/m2  SpO2 98%  LMP 08/20/2011  Physical Exam  Nursing note and vitals reviewed. Constitutional:  Awake, alert, nontoxic appearance.  HENT:  Head: Atraumatic.  Eyes: Right eye exhibits no discharge. Left eye exhibits no discharge.  Neck: Neck supple.  Pulmonary/Chest: Effort normal. She exhibits no tenderness.  Abdominal: Soft. There is no tenderness. There is no rebound.  Genitourinary: Vagina normal.       Manual exam only. 4 cm dilated, 80 5 effaced, membranes intact.  Musculoskeletal: She exhibits no tenderness.       Baseline ROM, no obvious new focal weakness.  Neurological:       Mental status and motor strength appears baseline for patient and situation.  Skin: No rash noted.  Psychiatric: She has a normal mood and affect.    ED Course  Procedures (including critical care time) FHT 142-148  0550  T/C to Melissa Noon, CNM   case discussed, including:  HPI, pertinent PM/SHx, VS/PE, dx testing, ED course  and treatment.  Agreeable to transfer for Dr. Debroah Loop.    MDM  Patient is G3, P1 36 weeks, 4 days with regular 2-3 minute contractions lasting 70-80 sec without evidence of fetal distress. Transfer to [facility] for care.        Nicoletta Dress. Colon Branch, MD 05/22/12 1610  Nicoletta Dress. Colon Branch, MD 05/22/12 9604  Nicoletta Dress. Colon Branch, MD 05/22/12 5409

## 2012-05-22 NOTE — ED Notes (Signed)
Pt was at [facility] for contractions and was monitored until about 2 pm Sat am.   Pt states she also thinks she lost her mucus plug, but no water breaking, no vag discharge or bleeding.    Pt states pains are 4 minutes apart

## 2012-05-22 NOTE — Plan of Care (Signed)
Problem: Consults Goal: Birthing Suites Patient Information Press F2 to bring up selections list   Pt < [redacted] weeks EGA     

## 2012-05-22 NOTE — MAU Note (Signed)
Pt to CT scan with RN and Tech. Witnessed seizure in CT scan- safety precautions taken, seizure lasted approximately 30 secs. Pt stable and taken to Ante room 152

## 2012-05-22 NOTE — Discharge Instructions (Signed)

## 2012-05-22 NOTE — MAU Note (Signed)
Witnessed seizure by RN and family at 61 and 35- Dr. Emelda Fear at bedside and aware. Safety measures taken. PO keppra switched to IV per Dr. Emelda Fear. Will administer once dispensed by pharmacy.

## 2012-05-22 NOTE — Progress Notes (Signed)
Steward Drone, RN called to report that the MD had checked pt and her cervix was 4 cm.  MD to call Jerolyn Center, CNM prior to transport to MAU
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
## 2012-05-22 NOTE — MAU Note (Signed)
Patient is brought in by ems from [facility]. She states that she is having ctx q38m since early morning and was told that her cervix 4/80. She denies vaginal bleeding or lof. Reports good fetal movement

## 2012-05-23 ENCOUNTER — Encounter (HOSPITAL_COMMUNITY): Payer: Self-pay | Admitting: Anesthesiology

## 2012-05-23 ENCOUNTER — Inpatient Hospital Stay (HOSPITAL_COMMUNITY)
Admission: EM | Admit: 2012-05-23 | Discharge: 2012-05-23 | Discharge status: Against Medical Advice | Payer: Medicaid Other | Source: Ambulatory Visit | Admitting: Obstetrics & Gynecology

## 2012-05-23 ENCOUNTER — Inpatient Hospital Stay (HOSPITAL_COMMUNITY): Payer: Medicaid Other

## 2012-05-23 ENCOUNTER — Encounter (HOSPITAL_COMMUNITY): Payer: Self-pay | Admitting: *Deleted

## 2012-05-23 DIAGNOSIS — O479 False labor, unspecified: Secondary | ICD-10-CM | POA: Insufficient documentation

## 2012-05-23 DIAGNOSIS — O1500 Eclampsia in pregnancy, unspecified trimester: Secondary | ICD-10-CM | POA: Insufficient documentation

## 2012-05-23 DIAGNOSIS — R569 Unspecified convulsions: Secondary | ICD-10-CM

## 2012-05-23 DIAGNOSIS — F445 Conversion disorder with seizures or convulsions: Secondary | ICD-10-CM | POA: Diagnosis present

## 2012-05-23 HISTORY — DX: Headache: R51

## 2012-05-23 HISTORY — DX: Anemia, unspecified: D64.9

## 2012-05-23 HISTORY — DX: Reserved for concepts with insufficient information to code with codable children: IMO0002

## 2012-05-23 HISTORY — DX: Unspecified abnormal cytological findings in specimens from cervix uteri: R87.619

## 2012-05-23 LAB — URINALYSIS, ROUTINE W REFLEX MICROSCOPIC
Protein, ur: NEGATIVE mg/dL
Urobilinogen, UA: 0.2 mg/dL (ref 0.0–1.0)

## 2012-05-23 LAB — URINE MICROSCOPIC-ADD ON

## 2012-05-23 MED ORDER — LEVETIRACETAM 500 MG PO TABS: 500.0000 mg | ORAL_TABLET | Freq: Two times a day (BID) | ORAL | Status: DC

## 2012-05-23 MED ORDER — TEMAZEPAM 7.5 MG PO CAPS: 15.0000 mg | ORAL_CAPSULE | Freq: Every evening | ORAL | Status: DC | PRN

## 2012-05-23 NOTE — MAU Note (Signed)
Admits they are psuedo seizures. Has had 2 since arrived. Incontinent of urine during second, had been waiting fo [employer]tech to bring bedpan.

## 2012-05-23 NOTE — MAU Note (Signed)
Was given 2 mg of Ativan by EMS (1 mg/ then 0.5mg  for 2nd and 3rd)

## 2012-05-23 NOTE — ED Notes (Signed)
Patient was seen by Rn staff leaving in her gown  With husband
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
## 2012-05-23 NOTE — ED Notes (Addendum)
Pt states she is being seen here [facility] Pt states she had one prior to my arrival to room. Pt reports she woke up with her head on the side rail. Pt reports a area of her head hurts because of the seizure. Pt has no obvious trauma, bruising, bleeding or redness to the site where pt reports her head "struck" the bed rail. Family is at bedside but nobody was in the room at the time with the pseudoseizure took place. Will continue to monitor

## 2012-05-23 NOTE — ED Notes (Signed)
ZOX:[id number]<AV> Expected date:05/23/12<BR> Expected time: 7:16 PM<BR> Means of arrival:Ambulance<BR> Comments:<BR> [facility]. From [facility] seizures

## 2012-05-23 NOTE — Progress Notes (Signed)
Called to see pt due to repetitive lates associated with a run of 8 consecutive contractions occurring on a q 3 min schedule. Beat to beat variability present.  Contractions regular, moderated intensity  No Srom.  Some bloody show noted on cervical exam.  Cx: 2-3/20%/-2 vertex firm  INtact.        Preg 36+4 weeks. New dx recurrent partial seizures/pseudoseizures Category II fhr. Latent phase labor Plan: Add 02, continue hydration, positon changes.          If no improvement in FHR pattern , delivery likely indicated.          Bedside BPP ordered

## 2012-05-23 NOTE — Discharge Summary (Signed)
FACULTY PRACTICE ANTEPARTUM(COMPREHENSIVE) NOTE  [name] is a 26 y.o. G3P1011 at [redacted]w[redacted]d by LMP, early ultrasound who is admitted for question of Seizures vs pseudoseizures.   Fetal presentation is cephalic. Length of Stay:  1  Days  Subjective:  pt only having contracitons when supine.   Patient reports the fetal movement as active. Patient reports uterine contraction  activity as irregular, every 3-5 minutes when supine. None when pt on side Patient reports  vaginal bleeding as scant staining. Patient describes fluid per vagina as None.  Vitals:  Blood pressure 118/72, pulse 71, temperature 97.8 F (36.6 C), temperature source Axillary, resp. rate 18, height 5\' 3"  (1.6 m), weight 90.266 kg (199 lb), last menstrual period 08/20/2011, SpO2 98.00%. Physical Examination:  General appearance - alert, well appearing, and in no distress Heart - normal rate and regular rhythm Abdomen - soft, nontender, nondistended Fundal Height:  size equals dates Cervical Exam: Position: posterior, Dilation: 2cm, Thickness: 2 cm and Consistency: firm and found to be / 25%/-2 and fetal presentation is cephalic. Extremities: extremities normal, atraumatic, no cyanosis or edema and Homans sign is negative, no sign of DVT with DTRs 2+ bilaterally Membranes:intact  Fetal Monitoring:  Baseline: 146 bpm, Variability: Good {> 6 bpm) and Decelerations: Absent  Labs:  Recent Results (from the past 24 hour(s))  URINE RAPID DRUG SCREEN (HOSP PERFORMED)   Collection Time   05/22/12  4:00 PM      Component Value Range   Opiates NONE DETECTED  NONE DETECTED   Cocaine NONE DETECTED  NONE DETECTED   Benzodiazepines NONE DETECTED  NONE DETECTED   Amphetamines NONE DETECTED  NONE DETECTED   Tetrahydrocannabinol NONE DETECTED  NONE DETECTED   Barbiturates NONE DETECTED  NONE DETECTED  URINALYSIS, ROUTINE W REFLEX MICROSCOPIC   Collection Time   05/22/12  4:00 PM      Component Value Range   Color, Urine YELLOW   YELLOW   APPearance CLEAR  CLEAR   Specific Gravity, Urine 1.025  1.005 - 1.030   pH 6.0  5.0 - 8.0   Glucose, UA NEGATIVE  NEGATIVE mg/dL   Hgb urine dipstick SMALL (*) NEGATIVE   Bilirubin Urine NEGATIVE  NEGATIVE   Ketones, ur NEGATIVE  NEGATIVE mg/dL   Protein, ur NEGATIVE  NEGATIVE mg/dL   Urobilinogen, UA 0.2  0.0 - 1.0 mg/dL   Nitrite NEGATIVE  NEGATIVE   Leukocytes, UA SMALL (*) NEGATIVE  PROTEIN / CREATININE RATIO, URINE   Collection Time   05/22/12  4:00 PM      Component Value Range   Creatinine, Urine 118.01     Total Protein, Urine 12.2     PROTEIN CREATININE RATIO 0.10  0.00 - 0.15  URINE MICROSCOPIC-ADD ON   Collection Time   05/22/12  4:00 PM      Component Value Range   Squamous Epithelial / LPF FEW (*) RARE   WBC, UA 3-6  <3 WBC/hpf   RBC / HPF 3-6  <3 RBC/hpf   Bacteria, UA MANY (*) RARE   Urine-Other MUCOUS PRESENT    CBC   Collection Time   05/22/12  4:29 PM      Component Value Range   WBC 17.2 (*) 4.0 - 10.5 K/uL   RBC 3.88  3.87 - 5.11 MIL/uL   Hemoglobin 11.4 (*) 12.0 - 15.0 g/dL   HCT 56.2 (*) 13.0 - 86.5 %   MCV 87.9  78.0 - 100.0 fL   MCH 29.4  26.0 - 34.0 pg   MCHC 33.4  30.0 - 36.0 g/dL   RDW 45.4  09.8 - 11.9 %   Platelets 221  150 - 400 K/uL  COMPREHENSIVE METABOLIC PANEL   Collection Time   05/22/12  4:29 PM      Component Value Range   Sodium 135  135 - 145 mEq/L   Potassium 3.3 (*) 3.5 - 5.1 mEq/L   Chloride 102  96 - 112 mEq/L   CO2 22  19 - 32 mEq/L   Glucose, Bld 100 (*) 70 - 99 mg/dL   BUN 3 (*) 6 - 23 mg/dL   Creatinine, Ser 1.47  0.50 - 1.10 mg/dL   Calcium 8.8  8.4 - 82.9 mg/dL   Total Protein 6.0  6.0 - 8.3 g/dL   Albumin 2.9 (*) 3.5 - 5.2 g/dL   AST 13  0 - 37 U/L   ALT 10  0 - 35 U/L   Alkaline Phosphatase 150 (*) 39 - 117 U/L   Total Bilirubin 0.2 (*) 0.3 - 1.2 mg/dL   GFR calc non Af Amer >90  >90 mL/min   GFR calc Af Amer >90  >90 mL/min  SAMPLE TO BLOOD BANK   Collection Time   05/22/12  4:29 PM       Component Value Range   Blood Bank Specimen SAMPLE AVAILABLE FOR TESTING     Sample Expiration 05/25/2012      Imaging Studies:   BpPP last nite 8/10 with the reactive strip noted after bathroom break mentioned in last note.  Medications:  Scheduled    . docusate sodium  100 mg Oral Daily  . HYDROmorphone  1 mg Intravenous Once  . levetiracetam  500 mg Intravenous Once  . levETIRAcetam  500 mg Oral Once  . levETIRAcetam  500 mg Oral BID  . LORazepam  0.5 mg Intravenous Once  . prenatal multivitamin  1 tablet Oral Daily   I have reviewed the patient's current medications.  ASSESSMENT: There is no problem list on file for this patient. Preg 36 wk, false labor Pseudoseizures   PLAN: Discharge home [facility] biweekly as outpatient Continue Keppra @ 500 bid Restoril for sleep  Lee-Anne Flicker V 05/23/2012,9:03 AM

## 2012-05-23 NOTE — MAU Note (Addendum)
Ctx's got regular around 1430. No  Leaking. Small amt of blood optical.  EMS arrival, EMT's reported 3 seizures, LOC of approx 15 second, immediately alert

## 2012-05-23 NOTE — MAU Note (Signed)
Pt had 2 more seizures, o2 sat remaines at 99-100and fetal heart rate remained 130's with good .variability

## 2012-05-23 NOTE — Progress Notes (Signed)
Ur chart review completed.  

## 2012-05-24 ENCOUNTER — Encounter (HOSPITAL_COMMUNITY): Payer: Self-pay | Admitting: *Deleted

## 2012-05-24 ENCOUNTER — Inpatient Hospital Stay (HOSPITAL_COMMUNITY)
Admission: AD | Admit: 2012-05-24 | Discharge: 2012-05-24 | Disposition: A | Discharge status: Home / Self Care | Payer: Medicaid Other | Source: Ambulatory Visit | Attending: Emergency Medicine | Admitting: Emergency Medicine

## 2012-05-24 DIAGNOSIS — O99891 Other specified diseases and conditions complicating pregnancy: Secondary | ICD-10-CM | POA: Insufficient documentation

## 2012-05-24 DIAGNOSIS — F445 Conversion disorder with seizures or convulsions: Secondary | ICD-10-CM

## 2012-05-24 DIAGNOSIS — W1800XA Striking against unspecified object with subsequent fall, initial encounter: Secondary | ICD-10-CM

## 2012-05-24 DIAGNOSIS — R569 Unspecified convulsions: Secondary | ICD-10-CM | POA: Insufficient documentation

## 2012-05-24 LAB — URINE MICROSCOPIC-ADD ON

## 2012-05-24 LAB — POCT I-STAT, CHEM 8
Calcium, Ion: 1.2 mmol/L (ref 1.12–1.32)
Chloride: 107 mEq/L (ref 96–112)
HCT: 29 % — ABNORMAL LOW (ref 36.0–46.0)
Hemoglobin: 9.9 g/dL — ABNORMAL LOW (ref 12.0–15.0)
Potassium: 3.5 mEq/L (ref 3.5–5.1)

## 2012-05-24 LAB — URINALYSIS, ROUTINE W REFLEX MICROSCOPIC
Bilirubin Urine: NEGATIVE
Hgb urine dipstick: NEGATIVE
Nitrite: NEGATIVE
Specific Gravity, Urine: <1.005 — ABNORMAL LOW (ref 1.005–1.030)
pH: 6 (ref 5.0–8.0)

## 2012-05-24 MED ORDER — ALPRAZOLAM 0.25 MG PO TABS
0.5000 mg | ORAL_TABLET | Freq: Once | ORAL | Status: AC
  Administered 2012-05-24: 0.5 mg via ORAL
  Filled 2012-05-24 (×2): qty 1

## 2012-05-24 MED ORDER — SODIUM CHLORIDE 0.9 % IJ SOLN
INTRAMUSCULAR | Status: AC
  Filled 2012-05-24: qty 6

## 2012-05-24 MED ORDER — OXYCODONE-ACETAMINOPHEN 5-325 MG PO TABS
1.0000 | ORAL_TABLET | Freq: Once | ORAL | Status: AC
  Administered 2012-05-24: 1 via ORAL
  Filled 2012-05-24: qty 1

## 2012-05-24 NOTE — ED Notes (Signed)
Resident at bedside.  

## 2012-05-24 NOTE — ED Notes (Signed)
Old and new EKG given to Dr. Karma Ganja.  Extra copies of both placed in pt chart
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
## 2012-05-24 NOTE — ED Notes (Signed)
Per [facility]- pt here for evaluation of seizures. Pt is [redacted] weeks pregnant with 3rd child. Pt fell and hit head on dresser today. Pt was seen at [facility]U and baby was evaluated and was noted to have heart rate in the 150's. Pt has bruise of left forehead. Pt was given 0.5 of xanax and 1 percocet prior to transport. [facility] reported 2 "seizure" en route. Per [facility] pt was able to verbalize when seizures were going to begin. Pt has 20g IV in rt hand. BP 124/69 hr 82 resp 20 99%O2 on room air.
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
## 2012-05-24 NOTE — MAU Note (Addendum)
PT was started on new meds today for seizures and was evaluated by Katherine Basset in office today. Was evaluated yesterday in MAU and transferred to [facility] ER by ambulance. Sister states that she walked out of [facility] because the nurses and doctors were ignoring her. States 2seizures since doctors appointment this evening. Pt has large bruise noted to forehead and left side of head.

## 2012-05-24 NOTE — ED Notes (Signed)
Doppler used for Fetal heart rate- 146 bpm in RLQ

## 2012-05-26 ENCOUNTER — Encounter (HOSPITAL_COMMUNITY): Payer: Self-pay | Admitting: *Deleted

## 2012-05-26 ENCOUNTER — Inpatient Hospital Stay (HOSPITAL_COMMUNITY)
Admission: AD | Admit: 2012-05-26 | Discharge: 2012-05-28 | DRG: 775 | Disposition: A | Discharge status: Home / Self Care | Payer: Medicaid Other | Source: Ambulatory Visit | Attending: Obstetrics and Gynecology | Admitting: Obstetrics and Gynecology

## 2012-05-26 ENCOUNTER — Encounter (HOSPITAL_COMMUNITY): Payer: Self-pay | Admitting: Anesthesiology

## 2012-05-26 ENCOUNTER — Inpatient Hospital Stay (HOSPITAL_COMMUNITY): Payer: Medicaid Other | Admitting: Anesthesiology

## 2012-05-26 DIAGNOSIS — Z22330 Carrier of Group B streptococcus: Secondary | ICD-10-CM

## 2012-05-26 DIAGNOSIS — R569 Unspecified convulsions: Secondary | ICD-10-CM | POA: Diagnosis present

## 2012-05-26 DIAGNOSIS — O9989 Other specified diseases and conditions complicating pregnancy, childbirth and the puerperium: Secondary | ICD-10-CM

## 2012-05-26 DIAGNOSIS — F445 Conversion disorder with seizures or convulsions: Secondary | ICD-10-CM

## 2012-05-26 DIAGNOSIS — O99892 Other specified diseases and conditions complicating childbirth: Principal | ICD-10-CM | POA: Diagnosis present

## 2012-05-26 LAB — CBC
HCT: 32.3 % — ABNORMAL LOW (ref 36.0–46.0)
Hemoglobin: 10.6 g/dL — ABNORMAL LOW (ref 12.0–15.0)
MCV: 88.5 fL (ref 78.0–100.0)
WBC: 13.7 10*3/uL — ABNORMAL HIGH (ref 4.0–10.5)

## 2012-05-26 LAB — GROUP B STREP BY PCR: Group B strep by PCR: POSITIVE — AB

## 2012-05-26 MED ORDER — DIBUCAINE 1 % RE OINT: 1.0000 "application " | TOPICAL_OINTMENT | RECTAL | Status: DC | PRN

## 2012-05-26 MED ORDER — EPHEDRINE 5 MG/ML INJ
10.0000 mg | INTRAVENOUS | Status: DC | PRN
  Filled 2012-05-26: qty 4

## 2012-05-26 MED ORDER — LACTATED RINGERS IV SOLN: INTRAVENOUS | Status: DC

## 2012-05-26 MED ORDER — ONDANSETRON HCL 4 MG/2ML IJ SOLN: 4.0000 mg | INTRAMUSCULAR | Status: DC | PRN

## 2012-05-26 MED ORDER — SENNOSIDES-DOCUSATE SODIUM 8.6-50 MG PO TABS
2.0000 | ORAL_TABLET | Freq: Every day | ORAL | Status: DC
  Administered 2012-05-26 – 2012-05-27 (×2): 2 via ORAL

## 2012-05-26 MED ORDER — LIDOCAINE HCL (PF) 1 % IJ SOLN
INTRAMUSCULAR | Status: DC | PRN
  Administered 2012-05-26 (×2): 4 mL

## 2012-05-26 MED ORDER — ONDANSETRON HCL 4 MG PO TABS: 4.0000 mg | ORAL_TABLET | ORAL | Status: DC | PRN

## 2012-05-26 MED ORDER — VENLAFAXINE HCL 37.5 MG PO TABS
37.5000 mg | ORAL_TABLET | Freq: Every day | ORAL | Status: DC
  Administered 2012-05-27 – 2012-05-28 (×2): 37.5 mg via ORAL
  Filled 2012-05-26 (×5): qty 1

## 2012-05-26 MED ORDER — CEFAZOLIN SODIUM 1-5 GM-% IV SOLN
1.0000 g | Freq: Three times a day (TID) | INTRAVENOUS | Status: DC
  Administered 2012-05-26: 1 g via INTRAVENOUS
  Filled 2012-05-26 (×2): qty 50

## 2012-05-26 MED ORDER — CITRIC ACID-SODIUM CITRATE 334-500 MG/5ML PO SOLN
30.0000 mL | ORAL | Status: DC | PRN
  Administered 2012-05-26: 30 mL via ORAL
  Filled 2012-05-26 (×2): qty 15

## 2012-05-26 MED ORDER — BENZOCAINE-MENTHOL 20-0.5 % EX AERO: 1.0000 "application " | INHALATION_SPRAY | CUTANEOUS | Status: DC | PRN

## 2012-05-26 MED ORDER — OXYTOCIN 40 UNITS IN LACTATED RINGERS INFUSION - SIMPLE MED
62.5000 mL/h | Freq: Once | INTRAVENOUS | Status: AC
  Administered 2012-05-26: 62.5 mL/h via INTRAVENOUS
  Filled 2012-05-26: qty 1000

## 2012-05-26 MED ORDER — OXYCODONE-ACETAMINOPHEN 5-325 MG PO TABS
1.0000 | ORAL_TABLET | ORAL | Status: DC | PRN
  Administered 2012-05-26 – 2012-05-27 (×4): 2 via ORAL
  Administered 2012-05-27 (×2): 1 via ORAL
  Administered 2012-05-28 (×2): 2 via ORAL
  Filled 2012-05-26 (×3): qty 2
  Filled 2012-05-26: qty 1
  Filled 2012-05-26: qty 2
  Filled 2012-05-26: qty 1
  Filled 2012-05-26 (×2): qty 2

## 2012-05-26 MED ORDER — PHENYLEPHRINE 40 MCG/ML (10ML) SYRINGE FOR IV PUSH (FOR BLOOD PRESSURE SUPPORT)
80.0000 ug | PREFILLED_SYRINGE | INTRAVENOUS | Status: DC | PRN
  Filled 2012-05-26: qty 5

## 2012-05-26 MED ORDER — FENTANYL 2.5 MCG/ML BUPIVACAINE 1/10 % EPIDURAL INFUSION (WH - ANES)
INTRAMUSCULAR | Status: DC | PRN
  Administered 2012-05-26: 14 mL/h via EPIDURAL

## 2012-05-26 MED ORDER — DIPHENHYDRAMINE HCL 50 MG/ML IJ SOLN: 12.5000 mg | INTRAMUSCULAR | Status: DC | PRN

## 2012-05-26 MED ORDER — WITCH HAZEL-GLYCERIN EX PADS: 1.0000 "application " | MEDICATED_PAD | CUTANEOUS | Status: DC | PRN

## 2012-05-26 MED ORDER — DIPHENHYDRAMINE HCL 25 MG PO CAPS: 25.0000 mg | ORAL_CAPSULE | Freq: Four times a day (QID) | ORAL | Status: DC | PRN

## 2012-05-26 MED ORDER — FENTANYL 2.5 MCG/ML BUPIVACAINE 1/10 % EPIDURAL INFUSION (WH - ANES)
14.0000 mL/h | INTRAMUSCULAR | Status: DC
  Administered 2012-05-26 (×2): 14 mL/h via EPIDURAL
  Filled 2012-05-26 (×2): qty 60

## 2012-05-26 MED ORDER — LACTATED RINGERS IV SOLN: 500.0000 mL | INTRAVENOUS | Status: DC | PRN

## 2012-05-26 MED ORDER — LEVETIRACETAM 500 MG PO TABS
750.0000 mg | ORAL_TABLET | Freq: Two times a day (BID) | ORAL | Status: DC
  Administered 2012-05-26 – 2012-05-28 (×4): 750 mg via ORAL
  Filled 2012-05-26 (×6): qty 1.5

## 2012-05-26 MED ORDER — NALBUPHINE SYRINGE 5 MG/0.5 ML
5.0000 mg | INJECTION | INTRAMUSCULAR | Status: DC | PRN
  Administered 2012-05-26: 5 mg via INTRAVENOUS
  Filled 2012-05-26 (×2): qty 0.5

## 2012-05-26 MED ORDER — PANTOPRAZOLE SODIUM 40 MG PO TBEC
40.0000 mg | DELAYED_RELEASE_TABLET | Freq: Every day | ORAL | Status: DC
  Administered 2012-05-27 – 2012-05-28 (×2): 40 mg via ORAL
  Filled 2012-05-26 (×3): qty 1

## 2012-05-26 MED ORDER — IBUPROFEN 600 MG PO TABS
600.0000 mg | ORAL_TABLET | Freq: Four times a day (QID) | ORAL | Status: DC
  Administered 2012-05-26 – 2012-05-28 (×8): 600 mg via ORAL
  Filled 2012-05-26 (×7): qty 1

## 2012-05-26 MED ORDER — SIMETHICONE 80 MG PO CHEW: 80.0000 mg | CHEWABLE_TABLET | ORAL | Status: DC | PRN

## 2012-05-26 MED ORDER — EPHEDRINE 5 MG/ML INJ: 10.0000 mg | INTRAVENOUS | Status: DC | PRN

## 2012-05-26 MED ORDER — LACTATED RINGERS IV SOLN
INTRAVENOUS | Status: DC
  Administered 2012-05-26 (×4): via INTRAVENOUS

## 2012-05-26 MED ORDER — OXYCODONE-ACETAMINOPHEN 5-325 MG PO TABS: 1.0000 | ORAL_TABLET | ORAL | Status: DC | PRN

## 2012-05-26 MED ORDER — PHENYLEPHRINE 40 MCG/ML (10ML) SYRINGE FOR IV PUSH (FOR BLOOD PRESSURE SUPPORT): 80.0000 ug | PREFILLED_SYRINGE | INTRAVENOUS | Status: DC | PRN

## 2012-05-26 MED ORDER — PRENATAL MULTIVITAMIN CH
1.0000 | ORAL_TABLET | Freq: Every day | ORAL | Status: DC
  Administered 2012-05-26 – 2012-05-28 (×2): 1 via ORAL
  Filled 2012-05-26 (×2): qty 1

## 2012-05-26 MED ORDER — ONDANSETRON HCL 4 MG/2ML IJ SOLN: 4.0000 mg | Freq: Four times a day (QID) | INTRAMUSCULAR | Status: DC | PRN

## 2012-05-26 MED ORDER — ACETAMINOPHEN 325 MG PO TABS: 650.0000 mg | ORAL_TABLET | ORAL | Status: DC | PRN

## 2012-05-26 MED ORDER — TETANUS-DIPHTH-ACELL PERTUSSIS 5-2.5-18.5 LF-MCG/0.5 IM SUSP: 0.5000 mL | Freq: Once | INTRAMUSCULAR | Status: DC

## 2012-05-26 MED ORDER — HYDROXYZINE HCL 25 MG PO TABS
25.0000 mg | ORAL_TABLET | Freq: Three times a day (TID) | ORAL | Status: DC | PRN
  Filled 2012-05-26: qty 1

## 2012-05-26 MED ORDER — FLEET ENEMA 7-19 GM/118ML RE ENEM: 1.0000 | ENEMA | RECTAL | Status: DC | PRN

## 2012-05-26 MED ORDER — OXYTOCIN BOLUS FROM INFUSION
250.0000 mL | Freq: Once | INTRAVENOUS | Status: AC
  Administered 2012-05-26: 250 mL via INTRAVENOUS
  Filled 2012-05-26: qty 500

## 2012-05-26 MED ORDER — LANOLIN HYDROUS EX OINT: TOPICAL_OINTMENT | CUTANEOUS | Status: DC | PRN

## 2012-05-26 MED ORDER — IBUPROFEN 600 MG PO TABS
600.0000 mg | ORAL_TABLET | Freq: Four times a day (QID) | ORAL | Status: DC | PRN
  Filled 2012-05-26: qty 1

## 2012-05-26 MED ORDER — PANTOPRAZOLE SODIUM 40 MG IV SOLR
40.0000 mg | Freq: Two times a day (BID) | INTRAVENOUS | Status: DC
  Administered 2012-05-26: 40 mg via INTRAVENOUS
  Filled 2012-05-26 (×4): qty 40

## 2012-05-26 MED ORDER — LIDOCAINE HCL (PF) 1 % IJ SOLN
30.0000 mL | INTRAMUSCULAR | Status: DC | PRN
  Filled 2012-05-26: qty 30

## 2012-05-26 MED ORDER — LACTATED RINGERS IV SOLN: 500.0000 mL | Freq: Once | INTRAVENOUS | Status: DC

## 2012-05-26 NOTE — Progress Notes (Signed)
Seen also by me.  Agree with note. 

## 2012-05-26 NOTE — MAU Note (Signed)
Pt c/o contraction that startted @ 2100 and have worsened in intensity since then.  Denies bleeding or leaking.

## 2012-05-26 NOTE — Progress Notes (Signed)
Dr. Emelda Fear stated that he believes with the patient's history of seizures and falling due to seizure that it would be better to AROM pt and let her labor.  Pt asked for epidural but CNM and MD would like pt to walk and make progress before AROM and epidural are done.

## 2012-05-26 NOTE — Anesthesia Procedure Notes (Signed)
Epidural Patient location during procedure: OB Start time: 05/26/2012 9:55 AM  Staffing Anesthesiologist: Cesareo Vickrey A. Performed by: anesthesiologist   Preanesthetic Checklist Completed: patient identified, site marked, surgical consent, pre-op evaluation, timeout performed, IV checked, risks and benefits discussed and monitors and equipment checked  Epidural Patient position: sitting Prep: site prepped and draped and DuraPrep Patient monitoring: continuous pulse ox and blood pressure Approach: midline Injection technique: LOR air  Needle:  Needle type: Tuohy  Needle gauge: 17 G Needle length: 9 cm Needle insertion depth: 7 cm Catheter type: closed end flexible Catheter size: 19 Gauge Catheter at skin depth: 12 cm Test dose: negative and Other  Assessment Events: blood not aspirated, injection not painful, no injection resistance, negative IV test and no paresthesia  Additional Notes Patient identified. Risks and benefits discussed including failed block, incomplete  Pain control, post dural puncture headache, nerve damage, paralysis, blood pressure Changes, nausea, vomiting, reactions to medications-both toxic and allergic and post Partum back pain. All questions were answered. Patient expressed understanding and wished to proceed. Sterile technique was used throughout procedure. Epidural site was Dressed with sterile barrier dressing. No paresthesias, signs of intravascular injection Or signs of intrathecal spread were encountered.  Patient was more comfortable after the epidural was dosed. Please see RN's note for documentation of vital signs and FHR which are stable.

## 2012-05-26 NOTE — Anesthesia Preprocedure Evaluation (Addendum)
Anesthesia Evaluation  Patient identified by MRN, date of birth, ID band Patient awake    Reviewed: Allergy & Precautions, H&P , Patient's Chart, lab work & pertinent test results  Airway Mallampati: III TM Distance: >3 FB Neck ROM: Full    Dental No notable dental hx. (+) Teeth Intact   Pulmonary Current Smoker,  breath sounds clear to auscultation  Pulmonary exam normal       Cardiovascular negative cardio ROS  Rhythm:Regular Rate:Normal     Neuro/Psych  Headaches, Seizures -,  Anxiety Depression Bipolar Disorder ADHDPseudoseizures     GI/Hepatic Neg liver ROS, GERD-  Medicated and Controlled,  Endo/Other  Morbid obesity  Renal/GU negative Renal ROS  negative genitourinary   Musculoskeletal negative musculoskeletal ROS (+)   Abdominal (+) + obese,   Peds  Hematology   Anesthesia Other Findings   Reproductive/Obstetrics (+) Pregnancy                          Anesthesia Physical Anesthesia Plan  ASA: III  Anesthesia Plan: Epidural   Post-op Pain Management:    Induction:   Airway Management Planned:   Additional Equipment:   Intra-op Plan:   Post-operative Plan:   Informed Consent: I have reviewed the patients History and Physical, chart, labs and discussed the procedure including the risks, benefits and alternatives for the proposed anesthesia with the patient or authorized representative who has indicated his/her understanding and acceptance.   Dental advisory given  Plan Discussed with: Anesthesiologist and Surgeon  Anesthesia Plan Comments:         Anesthesia Quick Evaluation

## 2012-05-26 NOTE — MAU Note (Signed)
PT SAYS SHE WAS HERE ON MON- 3 CM- SENT HOME.   HURT BAD AT 9PM.   DENIES HSV AN DMRSA

## 2012-05-26 NOTE — Progress Notes (Signed)
[name] is a 26 y.o. G3P1011 at [redacted]w[redacted]d  Subjective:   Objective: BP 121/79  Pulse 67  Temp 98.4 F (36.9 C) (Oral)  Resp 20  Ht 5\' 3"  (1.6 m)  Wt 205 lb (92.987 kg)  BMI 36.31 kg/m2  SpO2 98%  LMP 08/20/2011      FHT:  FHR: 120 bpm, variability: moderate,  accelerations:  Present,  decelerations:  Absent UC:   regular, every 3-4 minutes SVE:   Dilation: 7 Effacement (%): 90 Station: -1 Exam by:: [employer] CNM  Labs: Lab Results  Component Value Date   WBC 13.7* 05/26/2012   HGB 10.6* 05/26/2012   HCT 32.3* 05/26/2012   MCV 88.5 05/26/2012   PLT 191 05/26/2012    Assessment / Plan: Spontaneous labor, progressing normally  Labor: Progressing normally Preeclampsia:  n/a Fetal Wellbeing:  Category I Pain Control:  Epidural I/D:  Ancef for + GBS, PCN allergies rxn Hives/Rash w/o anaphylaxis Anticipated MOD:  NSVD  Will plan AROM after ABX infuse.  Derren Suydam E. 05/26/2012, 12:22 PM

## 2012-05-26 NOTE — Progress Notes (Signed)
[name] is a 26 y.o. G3P1011 at [redacted]w[redacted]d admitted for active labor  Subjective:   Objective: BP 118/77  Pulse 84  Temp 98.5 F (36.9 C) (Oral)  Resp 20  Ht 5\' 3"  (1.6 m)  Wt 92.987 kg (205 lb)  BMI 36.31 kg/m2  LMP 08/20/2011      FHT:  FHR: 150 bpm, variability: moderate,  accelerations:  Present,  decelerations:  Absent UC:   regular, every 3-5 minutes SVE:   Dilation: 6 Effacement (%): 100 Station: -3 Exam by:: Glorianne Proctor  Labs: Lab Results  Component Value Date   WBC 13.7* 05/26/2012   HGB 10.6* 05/26/2012   HCT 32.3* 05/26/2012   MCV 88.5 05/26/2012   PLT 191 05/26/2012    Assessment / Plan: Spontaneous labor, progressing normally  Labor: Progressing normally Preeclampsia:  no signs or symptoms of toxicity Fetal Wellbeing:  Category I Pain Control:  Epidural I/D:  n/a Anticipated MOD:  NSVD  Salisa Broz H. 05/26/2012, 9:23 AM

## 2012-05-26 NOTE — Progress Notes (Signed)
S: This is a 26 year old G3P1011 at [redacted]w[redacted]d by LMP presenting with contractions since 2100 last night.  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  Contractions: frequency unknown, began 2100 last night Membranes: intact Vaginal bleeding: none Vaginal discharge: none Fetal movement: present  O:  Filed Vitals:   05/26/12 0516  BP: 129/74  Pulse: 69  Temp: 98.3 F (36.8 C)  Resp: 18   Review of Systems - General ROS: negative for - chills, fever or night sweats Psychological ROS: positive for - anxiety, stress, pseudoseizures Ophthalmic ROS: negative ENT ROS: negative for - headaches, hearing change or visual changes Allergy and Immunology ROS: negative Hematological and Lymphatic ROS: negative Endocrine ROS: negative Respiratory ROS: no cough, shortness of breath, or wheezing Cardiovascular ROS: no chest pain or dyspnea on exertion Gastrointestinal ROS: no abdominal pain, change in bowel habits, or black or bloody stools Genito-Urinary ROS: no dysuria, trouble voiding, or hematuria Musculoskeletal ROS: negative Neurological ROS: no TIA or stroke symptoms Dermatological ROS: negative    Physical Examination: General appearance - alert, well appearing, and in no distress Mental status - alert, oriented to person, place, and time Eyes - pupils equal and reactive, extraocular eye movements intact Mouth - mucous membranes moist, pharynx normal without lesions Chest - clear to auscultation, no wheezes, rales or rhonchi, symmetric air entry Heart - normal rate, regular rhythm, normal S1, S2, no murmurs, rubs, clicks or gallops Abdomen - gravid, size c/w dates, soft, nontender, no masses or organomegaly Back exam - full range of motion, no tenderness, palpable spasm or pain on motion Neurological - alert, oriented, normal speech, no focal findings or movement disorder noted Musculoskeletal - no joint tenderness, deformity or  swelling Extremities - pedal edema 2 + B/L LE, 1+ B/L hand Skin - normal coloration and turgor, no rashes, no suspicious skin lesions noted  Dilation: 4 Effacement (%): 80 Cervical Position: Middle Station: -2 Presentation: Vertex Exam by:: Elie Confer RN Spec exam: not performed FHT: 145, mod variability, + accels, no decels Ctx: q7 minutes  Prenatal labs: ABO, Rh:  O positive Antibody:  Neg Rubella:  Imm RPR:   NR HBsAg: Neg  HIV:   Neg GBS:  pending HSV: Negative Hgb/Plt:  13.2 / 248 2hr GTT: 71, 109, 96 (normal) 1st trimester screen: IT neg  Labs:  Results for orders placed during the hospital encounter of 05/26/12 (from the past 24 hour(s))  CBC     Status: Abnormal   Collection Time   05/26/12  4:50 AM      Component Value Range   WBC 13.7 (*) 4.0 - 10.5 K/uL   RBC 3.65 (*) 3.87 - 5.11 MIL/uL   Hemoglobin 10.6 (*) 12.0 - 15.0 g/dL   HCT 40.9 (*) 81.1 - 91.4 %   MCV 88.5  78.0 - 100.0 fL   MCH 29.0  26.0 - 34.0 pg   MCHC 32.8  30.0 - 36.0 g/dL   RDW 78.2  95.6 - 21.3 %   Platelets 191  150 - 400 K/uL      A/P: 26 year old G3P1011 @ [redacted]w[redacted]d presents with SOL. 1. Admit to L&D 2. Expectant management, anticipate vaginal delivery 3. Antibiotics, none 4. Continuous toco/FHT

## 2012-05-27 ENCOUNTER — Other Ambulatory Visit: Payer: Self-pay

## 2012-05-27 LAB — CULTURE, BETA STREP (GROUP B ONLY)

## 2012-05-27 MED ORDER — FERROUS SULFATE 325 (65 FE) MG PO TABS
325.0000 mg | ORAL_TABLET | Freq: Every day | ORAL | Status: DC
  Administered 2012-05-27 – 2012-05-28 (×2): 325 mg via ORAL
  Filled 2012-05-27 (×2): qty 1

## 2012-05-27 MED ORDER — FAMOTIDINE IN NACL 20-0.9 MG/50ML-% IV SOLN
20.0000 mg | Freq: Once | INTRAVENOUS | Status: DC
  Filled 2012-05-27: qty 50

## 2012-05-27 MED ORDER — FAMOTIDINE 20 MG PO TABS
40.0000 mg | ORAL_TABLET | Freq: Two times a day (BID) | ORAL | Status: DC
  Administered 2012-05-27 – 2012-05-28 (×2): 40 mg via ORAL
  Filled 2012-05-27 (×2): qty 1
  Filled 2012-05-27: qty 2

## 2012-05-27 MED ORDER — GI COCKTAIL ~~LOC~~
30.0000 mL | Freq: Once | ORAL | Status: AC
  Administered 2012-05-27: 30 mL via ORAL
  Filled 2012-05-27: qty 30

## 2012-05-27 NOTE — Progress Notes (Signed)
Post Partum Day 1 Subjective: no complaints and No seizures since delivery  Objective: Blood pressure 140/80, pulse 79, temperature 98.7 F (37.1 C), temperature source Oral, resp. rate 20, height 5\' 3"  (1.6 m), weight 205 lb (92.987 kg), last menstrual period 08/20/2011, SpO2 98.00%, unknown if currently breastfeeding.  Physical Exam:  General: cooperative, no distress and drowsy Lochia: appropriate Uterine Fundus: firm Incision: n/a DVT Evaluation: No evidence of DVT seen on physical exam.   Basename 05/26/12 0450 05/24/12 2253  HGB 10.6* 9.9*  HCT 32.3* 29.0*    Assessment/Plan: Plan for discharge tomorrow, Social Work consult and Contraception Depo at [facility]   LOS: 1 day   [name] E. 05/27/2012, 7:23 AM

## 2012-05-27 NOTE — Progress Notes (Signed)
UR Chart review completed.  
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
## 2012-05-27 NOTE — Anesthesia Postprocedure Evaluation (Signed)
  Anesthesia Post-op Note  Patient: [name]  Procedure(s) Performed: * No procedures listed *  Patient Location: Mother/Baby  Anesthesia Type: Epidural  Level of Consciousness: awake  Airway and Oxygen Therapy: Patient Spontanous Breathing  Post-op Pain: none  Post-op Assessment: Patient's Cardiovascular Status Stable, Respiratory Function Stable, Patent Airway, No signs of Nausea or vomiting, Adequate PO intake, Pain level controlled, No headache, No backache, No residual numbness and No residual motor weakness  Post-op Vital Signs: Reviewed and stable  Complications: No apparent anesthesia complications

## 2012-05-28 MED ORDER — OXYCODONE-ACETAMINOPHEN 5-325 MG PO TABS: 1.0000 | ORAL_TABLET | ORAL | Status: AC | PRN

## 2012-05-28 MED ORDER — MEDROXYPROGESTERONE ACETATE 150 MG/ML IM SUSP
150.0000 mg | Freq: Once | INTRAMUSCULAR | Status: AC
  Administered 2012-05-28: 150 mg via INTRAMUSCULAR
  Filled 2012-05-28: qty 1

## 2012-05-28 MED ORDER — IBUPROFEN 600 MG PO TABS: 600.0000 mg | ORAL_TABLET | Freq: Four times a day (QID) | ORAL | Status: DC | PRN

## 2012-05-28 MED ORDER — IBUPROFEN 600 MG PO TABS: 600.0000 mg | ORAL_TABLET | Freq: Four times a day (QID) | ORAL | Status: AC | PRN

## 2012-05-28 MED ORDER — OXYCODONE-ACETAMINOPHEN 5-325 MG PO TABS
1.0000 | ORAL_TABLET | ORAL | Status: DC | PRN
Start: 2012-05-28 — End: 2012-05-28
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
## 2012-05-28 NOTE — Discharge Summary (Signed)
Obstetric Discharge Summary Reason for Admission: onset of labor Prenatal Procedures: none Intrapartum Procedures: spontaneous vaginal delivery Postpartum Procedures: none Complications-Operative and Postpartum: none  Patient's pregnancy was complicated by frequent pseudoseizures during the last week of the pregnancy, for which she was placed on Keppra as a precaution as she was unable to undergo full neurologic evaluation during the pregnancy.  She also takes Effexor and as-needed hydroxyzine as well, which we will continue until she sees her OB in followup.  Hemoglobin  Date Value Range Status  05/26/2012 10.6* 12.0 - 15.0 g/dL Final     HCT  Date Value Range Status  05/26/2012 32.3* 36.0 - 46.0 % Final    Physical Exam:  General: alert, cooperative and no distress Lochia: appropriate Uterine Fundus: firm DVT Evaluation: No evidence of DVT seen on physical exam.  Discharge Diagnoses: Term Pregnancy-delivered  Discharge Information: Date: 05/28/2012 Activity: pelvic rest Diet: routine Medications: PNV, Ibuprofen and Percocet Condition: stable Instructions: refer to practice specific booklet Discharge to: home Follow-up Information    Follow up with [facility]. Schedule an appointment as soon as possible for a visit in 4 weeks.   Contact information:   [address] [place] [address]          Newborn Data: Live born female  Birth Weight: 6 lb 4.2 oz (2841 g) APGAR: 9, 9  Home with mother.  Junious Silk S 05/28/2012, 7:28 AM  Patient seen and examined.  Agree with above note.  Levie Heritage, DO 05/28/2012 7:34 AM

## 2012-05-28 NOTE — ED Provider Notes (Signed)
I saw and evaluated the patient, reviewed the resident's note and I agree with the findings and plan.  Pt seen and examined.  Awake and alert- she has a hx of pseudoseizures in the past- this is similar to her prior pseudoseizures.    Ethelda Chick, MD 05/28/12 [phone]

## 2012-05-29 NOTE — MAU Provider Note (Signed)
Attestation of Attending Supervision of Advanced Practitioner: Evaluation and management procedures were performed by the PA/NP/CNM/OB Fellow under my supervision/collaboration. Chart reviewed and agree with management and plan.  Louine Tenpenny V 05/29/2012 10:40 AM    

## 2012-07-20 ENCOUNTER — Encounter (HOSPITAL_COMMUNITY): Payer: Self-pay

## 2012-07-20 ENCOUNTER — Emergency Department (HOSPITAL_COMMUNITY)
Admission: EM | Admit: 2012-07-20 | Discharge: 2012-07-20 | Disposition: A | Discharge status: Home / Self Care | Payer: Medicaid Other | Attending: Emergency Medicine | Admitting: Emergency Medicine

## 2012-07-20 DIAGNOSIS — R030 Elevated blood-pressure reading, without diagnosis of hypertension: Secondary | ICD-10-CM | POA: Insufficient documentation

## 2012-07-20 NOTE — ED Notes (Signed)
Pt reports feeling shakey and sweaty for past couple of days.  Went to doctor yesterday and was told her bp 157/100.  Today pt feels worse, checked bp and says was 155/106.  Denies history of hypertension during pregnany.  Reports had vaginal delivery May 26, 2012.

## 2012-07-20 NOTE — ED Notes (Signed)
Notified pt approx 10 min ago that there are a lot of discharges and that I would try to get everyone that is waiting moved to the back.  Pt informed registration staff that she was leaving because her baby was getting fussy.

## 2012-08-09 ENCOUNTER — Other Ambulatory Visit: Payer: Self-pay | Admitting: Obstetrics and Gynecology

## 2012-08-21 ENCOUNTER — Other Ambulatory Visit: Payer: Self-pay | Admitting: Obstetrics and Gynecology

## 2012-08-29 ENCOUNTER — Other Ambulatory Visit: Payer: Self-pay | Admitting: Obstetrics and Gynecology

## 2012-09-13 ENCOUNTER — Emergency Department (HOSPITAL_COMMUNITY): Payer: Medicaid Other

## 2012-09-13 ENCOUNTER — Emergency Department (HOSPITAL_COMMUNITY)
Admission: EM | Admit: 2012-09-13 | Discharge: 2012-09-13 | Disposition: A | Discharge status: Home / Self Care | Payer: Medicaid Other | Attending: Emergency Medicine | Admitting: Emergency Medicine

## 2012-09-13 ENCOUNTER — Encounter (HOSPITAL_COMMUNITY): Payer: Self-pay

## 2012-09-13 DIAGNOSIS — Z79899 Other long term (current) drug therapy: Secondary | ICD-10-CM | POA: Insufficient documentation

## 2012-09-13 DIAGNOSIS — K219 Gastro-esophageal reflux disease without esophagitis: Secondary | ICD-10-CM | POA: Insufficient documentation

## 2012-09-13 DIAGNOSIS — F909 Attention-deficit hyperactivity disorder, unspecified type: Secondary | ICD-10-CM | POA: Insufficient documentation

## 2012-09-13 DIAGNOSIS — Z862 Personal history of diseases of the blood and blood-forming organs and certain disorders involving the immune mechanism: Secondary | ICD-10-CM | POA: Insufficient documentation

## 2012-09-13 DIAGNOSIS — Z8669 Personal history of other diseases of the nervous system and sense organs: Secondary | ICD-10-CM | POA: Insufficient documentation

## 2012-09-13 DIAGNOSIS — Z87828 Personal history of other (healed) physical injury and trauma: Secondary | ICD-10-CM | POA: Insufficient documentation

## 2012-09-13 DIAGNOSIS — F3289 Other specified depressive episodes: Secondary | ICD-10-CM | POA: Insufficient documentation

## 2012-09-13 DIAGNOSIS — Z8659 Personal history of other mental and behavioral disorders: Secondary | ICD-10-CM | POA: Insufficient documentation

## 2012-09-13 DIAGNOSIS — F17200 Nicotine dependence, unspecified, uncomplicated: Secondary | ICD-10-CM | POA: Insufficient documentation

## 2012-09-13 DIAGNOSIS — F329 Major depressive disorder, single episode, unspecified: Secondary | ICD-10-CM | POA: Insufficient documentation

## 2012-09-13 DIAGNOSIS — M5412 Radiculopathy, cervical region: Secondary | ICD-10-CM | POA: Insufficient documentation

## 2012-09-13 DIAGNOSIS — Z8742 Personal history of other diseases of the female genital tract: Secondary | ICD-10-CM | POA: Insufficient documentation

## 2012-09-13 DIAGNOSIS — M546 Pain in thoracic spine: Secondary | ICD-10-CM | POA: Insufficient documentation

## 2012-09-13 DIAGNOSIS — F411 Generalized anxiety disorder: Secondary | ICD-10-CM | POA: Insufficient documentation

## 2012-09-13 MED ORDER — OXYCODONE-ACETAMINOPHEN 5-325 MG PO TABS: 1.0000 | ORAL_TABLET | ORAL | Status: AC | PRN

## 2012-09-13 MED ORDER — KETOROLAC TROMETHAMINE 60 MG/2ML IM SOLN
60.0000 mg | Freq: Once | INTRAMUSCULAR | Status: AC
  Administered 2012-09-13: 60 mg via INTRAMUSCULAR
  Filled 2012-09-13: qty 2

## 2012-09-13 MED ORDER — METAXALONE 800 MG PO TABS: 800.0000 mg | ORAL_TABLET | Freq: Three times a day (TID) | ORAL | Status: DC
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
## 2012-09-13 NOTE — ED Notes (Signed)
Pain in my back and neck that has got worse since i had my baby 3 months ago. Now my head hurts with my neck pain per pt. I can't lay flat on my back per pt. If I sleep on my side my shoulder and neck hurt, no matter which side I sleep on per pt. Denies fever, nausea, vomiting per pt.

## 2012-09-16 NOTE — ED Provider Notes (Signed)
Medical screening examination/treatment/procedure(s) were performed by non-physician practitioner and as supervising physician I was immediately available for consultation/collaboration.   Benny Lennert, MD 09/16/12 [phone]

## 2012-09-19 MED FILL — Oxycodone w/ Acetaminophen Tab 5-325 MG: ORAL | Qty: 6 | Status: AC

## 2012-10-07 ENCOUNTER — Encounter (HOSPITAL_COMMUNITY): Payer: Self-pay | Admitting: *Deleted

## 2012-10-07 ENCOUNTER — Emergency Department (HOSPITAL_COMMUNITY)
Admission: EM | Admit: 2012-10-07 | Discharge: 2012-10-07 | Disposition: A | Discharge status: Home / Self Care | Payer: Medicaid Other | Attending: Emergency Medicine | Admitting: Emergency Medicine

## 2012-10-07 DIAGNOSIS — F17200 Nicotine dependence, unspecified, uncomplicated: Secondary | ICD-10-CM | POA: Insufficient documentation

## 2012-10-07 DIAGNOSIS — Z87820 Personal history of traumatic brain injury: Secondary | ICD-10-CM | POA: Insufficient documentation

## 2012-10-07 DIAGNOSIS — M26609 Unspecified temporomandibular joint disorder, unspecified side: Secondary | ICD-10-CM | POA: Insufficient documentation

## 2012-10-07 DIAGNOSIS — Z79899 Other long term (current) drug therapy: Secondary | ICD-10-CM | POA: Insufficient documentation

## 2012-10-07 DIAGNOSIS — Z862 Personal history of diseases of the blood and blood-forming organs and certain disorders involving the immune mechanism: Secondary | ICD-10-CM | POA: Insufficient documentation

## 2012-10-07 DIAGNOSIS — F909 Attention-deficit hyperactivity disorder, unspecified type: Secondary | ICD-10-CM | POA: Insufficient documentation

## 2012-10-07 DIAGNOSIS — K219 Gastro-esophageal reflux disease without esophagitis: Secondary | ICD-10-CM | POA: Insufficient documentation

## 2012-10-07 DIAGNOSIS — R569 Unspecified convulsions: Secondary | ICD-10-CM | POA: Insufficient documentation

## 2012-10-07 DIAGNOSIS — M26629 Arthralgia of temporomandibular joint, unspecified side: Secondary | ICD-10-CM

## 2012-10-07 DIAGNOSIS — F341 Dysthymic disorder: Secondary | ICD-10-CM | POA: Insufficient documentation

## 2012-10-07 DIAGNOSIS — F319 Bipolar disorder, unspecified: Secondary | ICD-10-CM | POA: Insufficient documentation

## 2012-10-07 MED ORDER — NAPROXEN 500 MG PO TABS: 500.0000 mg | ORAL_TABLET | Freq: Two times a day (BID) | ORAL | Status: DC

## 2012-10-07 MED ORDER — HYDROCODONE-ACETAMINOPHEN 5-325 MG PO TABS: 1.0000 | ORAL_TABLET | Freq: Four times a day (QID) | ORAL | Status: AC | PRN
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
## 2012-10-07 NOTE — ED Provider Notes (Signed)
Medical screening examination/treatment/procedure(s) were performed by non-physician practitioner and as supervising physician I was immediately available for consultation/collaboration.   Benny Lennert, MD 10/07/12 [phone]

## 2012-10-07 NOTE — ED Notes (Signed)
Pain rt jaw for 2-3 days, says she does not think she has a bad tooth.  No injury

## 2012-10-20 ENCOUNTER — Emergency Department (HOSPITAL_COMMUNITY)
Admission: EM | Admit: 2012-10-20 | Discharge: 2012-10-20 | Disposition: A | Discharge status: Home / Self Care | Payer: Medicaid Other | Attending: Emergency Medicine | Admitting: Emergency Medicine

## 2012-10-20 ENCOUNTER — Encounter (HOSPITAL_COMMUNITY): Payer: Self-pay | Admitting: *Deleted

## 2012-10-20 DIAGNOSIS — Z862 Personal history of diseases of the blood and blood-forming organs and certain disorders involving the immune mechanism: Secondary | ICD-10-CM | POA: Insufficient documentation

## 2012-10-20 DIAGNOSIS — Z8659 Personal history of other mental and behavioral disorders: Secondary | ICD-10-CM | POA: Insufficient documentation

## 2012-10-20 DIAGNOSIS — F17200 Nicotine dependence, unspecified, uncomplicated: Secondary | ICD-10-CM | POA: Insufficient documentation

## 2012-10-20 DIAGNOSIS — Z79899 Other long term (current) drug therapy: Secondary | ICD-10-CM | POA: Insufficient documentation

## 2012-10-20 DIAGNOSIS — J329 Chronic sinusitis, unspecified: Secondary | ICD-10-CM

## 2012-10-20 DIAGNOSIS — Z87820 Personal history of traumatic brain injury: Secondary | ICD-10-CM | POA: Insufficient documentation

## 2012-10-20 DIAGNOSIS — M26629 Arthralgia of temporomandibular joint, unspecified side: Secondary | ICD-10-CM

## 2012-10-20 DIAGNOSIS — M26609 Unspecified temporomandibular joint disorder, unspecified side: Secondary | ICD-10-CM | POA: Insufficient documentation

## 2012-10-20 DIAGNOSIS — Z8719 Personal history of other diseases of the digestive system: Secondary | ICD-10-CM | POA: Insufficient documentation

## 2012-10-20 MED ORDER — PROMETHAZINE HCL 12.5 MG PO TABS
12.5000 mg | ORAL_TABLET | Freq: Once | ORAL | Status: AC
  Administered 2012-10-20: 12.5 mg via ORAL
  Filled 2012-10-20: qty 1

## 2012-10-20 MED ORDER — HYDROCODONE-ACETAMINOPHEN 5-325 MG PO TABS: 1.0000 | ORAL_TABLET | ORAL | Status: DC | PRN

## 2012-10-20 MED ORDER — DICLOFENAC SODIUM 75 MG PO TBEC: 75.0000 mg | DELAYED_RELEASE_TABLET | Freq: Two times a day (BID) | ORAL | Status: DC

## 2012-10-20 MED ORDER — KETOROLAC TROMETHAMINE 10 MG PO TABS
10.0000 mg | ORAL_TABLET | Freq: Once | ORAL | Status: AC
  Administered 2012-10-20: 10 mg via ORAL
  Filled 2012-10-20: qty 1

## 2012-10-20 MED ORDER — PSEUDOEPHEDRINE HCL 60 MG PO TABS: ORAL_TABLET | ORAL | Status: DC

## 2012-10-20 MED ORDER — MORPHINE SULFATE 4 MG/ML IJ SOLN
8.0000 mg | Freq: Once | INTRAMUSCULAR | Status: AC
  Administered 2012-10-20: 8 mg via INTRAMUSCULAR
  Filled 2012-10-20: qty 2

## 2012-10-20 NOTE — ED Notes (Signed)
Continues to have right jaw pain
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
## 2012-10-20 NOTE — ED Notes (Signed)
Alert, NAd, pain rt side of face, Seen here for same.recently ,says she has seen a dentist and that no problem with teeth was seen. No injury, Pt thinks she may have a sinus infection.  Pain  In rt side of face and into rt ear.

## 2012-10-21 NOTE — ED Provider Notes (Signed)
Medical screening examination/treatment/procedure(s) were performed by non-physician practitioner and as supervising physician I was immediately available for consultation/collaboration. Devoria Albe, MD, Armando Gang   Ward Givens, MD 10/21/12 Jacinta Shoe

## 2012-11-30 ENCOUNTER — Emergency Department: Payer: Self-pay

## 2012-11-30 LAB — CK TOTAL AND CKMB (NOT AT ARMC): CK-MB: 1.2 ng/mL (ref 0.5–3.6)

## 2012-11-30 LAB — BASIC METABOLIC PANEL
Anion Gap: 8 (ref 7–16)
BUN: 13 mg/dL (ref 7–18)
Chloride: 106 mmol/L (ref 98–107)
Co2: 25 mmol/L (ref 21–32)
Creatinine: 0.82 mg/dL (ref 0.60–1.30)
EGFR (African American): >60
EGFR (Non-African Amer.): >60
Osmolality: 279 (ref 275–301)
Potassium: 3.8 mmol/L (ref 3.5–5.1)
Sodium: 139 mmol/L (ref 136–145)

## 2012-11-30 LAB — CBC: WBC: 17.7 10*3/uL — ABNORMAL HIGH (ref 3.6–11.0)

## 2013-01-12 ENCOUNTER — Emergency Department: Payer: Self-pay | Admitting: Emergency Medicine

## 2013-02-19 ENCOUNTER — Encounter (HOSPITAL_COMMUNITY): Payer: Self-pay | Admitting: Emergency Medicine

## 2013-02-19 ENCOUNTER — Emergency Department (HOSPITAL_COMMUNITY)
Admission: EM | Admit: 2013-02-19 | Discharge: 2013-02-19 | Disposition: A | Discharge status: Home / Self Care | Payer: Medicaid Other | Attending: Emergency Medicine | Admitting: Emergency Medicine

## 2013-02-19 DIAGNOSIS — M5412 Radiculopathy, cervical region: Secondary | ICD-10-CM | POA: Insufficient documentation

## 2013-02-19 DIAGNOSIS — Z8659 Personal history of other mental and behavioral disorders: Secondary | ICD-10-CM | POA: Insufficient documentation

## 2013-02-19 DIAGNOSIS — Z87828 Personal history of other (healed) physical injury and trauma: Secondary | ICD-10-CM | POA: Insufficient documentation

## 2013-02-19 DIAGNOSIS — F411 Generalized anxiety disorder: Secondary | ICD-10-CM | POA: Insufficient documentation

## 2013-02-19 DIAGNOSIS — M2550 Pain in unspecified joint: Secondary | ICD-10-CM | POA: Insufficient documentation

## 2013-02-19 DIAGNOSIS — F319 Bipolar disorder, unspecified: Secondary | ICD-10-CM | POA: Insufficient documentation

## 2013-02-19 DIAGNOSIS — M792 Neuralgia and neuritis, unspecified: Secondary | ICD-10-CM

## 2013-02-19 DIAGNOSIS — Z79899 Other long term (current) drug therapy: Secondary | ICD-10-CM | POA: Insufficient documentation

## 2013-02-19 DIAGNOSIS — Z8669 Personal history of other diseases of the nervous system and sense organs: Secondary | ICD-10-CM | POA: Insufficient documentation

## 2013-02-19 DIAGNOSIS — K219 Gastro-esophageal reflux disease without esophagitis: Secondary | ICD-10-CM | POA: Insufficient documentation

## 2013-02-19 DIAGNOSIS — F17200 Nicotine dependence, unspecified, uncomplicated: Secondary | ICD-10-CM | POA: Insufficient documentation

## 2013-02-19 DIAGNOSIS — Z862 Personal history of diseases of the blood and blood-forming organs and certain disorders involving the immune mechanism: Secondary | ICD-10-CM | POA: Insufficient documentation

## 2013-02-19 MED ORDER — TRAMADOL HCL 50 MG PO TABS
50.0000 mg | ORAL_TABLET | Freq: Once | ORAL | Status: AC
  Administered 2013-02-19: 50 mg via ORAL
  Filled 2013-02-19: qty 1

## 2013-02-19 MED ORDER — TRAMADOL HCL 50 MG PO TABS: 50.0000 mg | ORAL_TABLET | Freq: Four times a day (QID) | ORAL | Status: DC | PRN

## 2013-02-19 NOTE — ED Notes (Signed)
States she has been dx with 'bone spurs in neck' but hasn't followed up. No pain with right shoulder movement, just feels popping. Good CMS.

## 2013-02-19 NOTE — ED Notes (Signed)
C/o R arm pain and numbness from R axilla to fingertips since Friday.  No known injury.  Also c/o L wrist pain x 2 months since moving furniture.
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
## 2013-02-20 NOTE — ED Provider Notes (Signed)
Medical screening examination/treatment/procedure(s) were performed by non-physician practitioner and as supervising physician I was immediately available for consultation/collaboration.  Flint Melter, MD 02/20/13 [phone]

## 2013-04-18 ENCOUNTER — Encounter: Payer: Self-pay | Admitting: *Deleted

## 2013-04-19 ENCOUNTER — Ambulatory Visit: Payer: Self-pay | Admitting: Obstetrics and Gynecology

## 2013-08-09 ENCOUNTER — Other Ambulatory Visit: Payer: Self-pay | Admitting: Obstetrics and Gynecology

## 2013-08-09 DIAGNOSIS — F329 Major depressive disorder, single episode, unspecified: Secondary | ICD-10-CM

## 2013-08-15 ENCOUNTER — Other Ambulatory Visit: Payer: Self-pay | Admitting: *Deleted

## 2013-08-17 MED ORDER — VENLAFAXINE HCL ER 150 MG PO CP24: 150.0000 mg | ORAL_CAPSULE | Freq: Every day | ORAL | Status: DC

## 2014-03-10 ENCOUNTER — Emergency Department (HOSPITAL_COMMUNITY)
Admission: EM | Admit: 2014-03-10 | Discharge: 2014-03-10 | Disposition: A | Discharge status: Home / Self Care | Payer: Medicaid Other | Attending: Emergency Medicine | Admitting: Emergency Medicine

## 2014-03-10 DIAGNOSIS — W1809XA Striking against other object with subsequent fall, initial encounter: Secondary | ICD-10-CM | POA: Insufficient documentation

## 2014-03-10 DIAGNOSIS — Y92009 Unspecified place in unspecified non-institutional (private) residence as the place of occurrence of the external cause: Secondary | ICD-10-CM | POA: Insufficient documentation

## 2014-03-10 DIAGNOSIS — Z79899 Other long term (current) drug therapy: Secondary | ICD-10-CM | POA: Insufficient documentation

## 2014-03-10 DIAGNOSIS — F3289 Other specified depressive episodes: Secondary | ICD-10-CM | POA: Insufficient documentation

## 2014-03-10 DIAGNOSIS — F329 Major depressive disorder, single episode, unspecified: Secondary | ICD-10-CM | POA: Insufficient documentation

## 2014-03-10 DIAGNOSIS — F411 Generalized anxiety disorder: Secondary | ICD-10-CM | POA: Insufficient documentation

## 2014-03-10 DIAGNOSIS — Z87820 Personal history of traumatic brain injury: Secondary | ICD-10-CM | POA: Insufficient documentation

## 2014-03-10 DIAGNOSIS — K219 Gastro-esophageal reflux disease without esophagitis: Secondary | ICD-10-CM | POA: Insufficient documentation

## 2014-03-10 DIAGNOSIS — S161XXA Strain of muscle, fascia and tendon at neck level, initial encounter: Secondary | ICD-10-CM

## 2014-03-10 DIAGNOSIS — Z862 Personal history of diseases of the blood and blood-forming organs and certain disorders involving the immune mechanism: Secondary | ICD-10-CM | POA: Insufficient documentation

## 2014-03-10 DIAGNOSIS — W19XXXA Unspecified fall, initial encounter: Secondary | ICD-10-CM

## 2014-03-10 DIAGNOSIS — Z8669 Personal history of other diseases of the nervous system and sense organs: Secondary | ICD-10-CM | POA: Insufficient documentation

## 2014-03-10 DIAGNOSIS — S139XXA Sprain of joints and ligaments of unspecified parts of neck, initial encounter: Secondary | ICD-10-CM | POA: Insufficient documentation

## 2014-03-10 DIAGNOSIS — F17200 Nicotine dependence, unspecified, uncomplicated: Secondary | ICD-10-CM | POA: Insufficient documentation

## 2014-03-10 DIAGNOSIS — F909 Attention-deficit hyperactivity disorder, unspecified type: Secondary | ICD-10-CM | POA: Insufficient documentation

## 2014-03-10 DIAGNOSIS — IMO0002 Reserved for concepts with insufficient information to code with codable children: Secondary | ICD-10-CM | POA: Insufficient documentation

## 2014-03-10 DIAGNOSIS — Z880 Allergy status to penicillin: Secondary | ICD-10-CM | POA: Insufficient documentation

## 2014-03-10 DIAGNOSIS — Y9389 Activity, other specified: Secondary | ICD-10-CM | POA: Insufficient documentation

## 2014-03-10 MED ORDER — HYDROCODONE-ACETAMINOPHEN 5-325 MG PO TABS
1.0000 | ORAL_TABLET | Freq: Four times a day (QID) | ORAL | Status: DC | PRN
Start: 2014-03-10 — End: 2015-08-13

## 2014-03-10 NOTE — Discharge Instructions (Signed)
Cervical Strain and Sprain (Whiplash)  with Rehab  Cervical strain and sprains are injuries that commonly occur with "whiplash" injuries. Whiplash occurs when the neck is forcefully whipped backward or forward, such as during a motor vehicle accident. The muscles, ligaments, tendons, discs and nerves of the neck are susceptible to injury when this occurs.  SYMPTOMS   · Pain or stiffness in the front and/or back of neck  · Symptoms may present immediately or up to 24 hours after injury.  · Dizziness, headache, nausea and vomiting.  · Muscle spasm with soreness and stiffness in the neck.  · Tenderness and swelling at the injury site.  CAUSES   Whiplash injuries often occur during contact sports or motor vehicle accidents.   RISK INCREASES WITH:  · Osteoarthritis of the spine.  · Situations that make head or neck accidents or trauma more likely.  · High-risk sports (football, rugby, wrestling, hockey, auto racing, gymnastics, diving, contact karate or boxing).  · Poor strength and flexibility of the neck.  · Previous neck injury.  · Poor tackling technique.  · Improperly fitted or padded equipment.  PREVENTION  · Learn and use proper technique (avoid tackling with the head, spearing and head-butting; use proper falling techniques to avoid landing on the head).  · Warm up and stretch properly before activity.  · Maintain physical fitness:  · Strength, flexibility and endurance.  · Cardiovascular fitness.  · Wear properly fitted and padded protective equipment, such as padded soft collars, for participation in contact sports.  PROGNOSIS   Recovery for cervical strain and sprain injuries is dependent on the extent of the injury. These injuries are usually curable in 1 week to 3 months with appropriate treatment.   RELATED COMPLICATIONS   · Temporary numbness and weakness may occur if the nerve roots are damaged, and this may persist until the nerve has completely healed.  · Chronic pain due to frequent recurrence of  symptoms.  · Prolonged healing, especially if activity is resumed too soon (before complete recovery).  TREATMENT   Treatment initially involves the use of ice and medication to help reduce pain and inflammation. It is also important to perform strengthening and stretching exercises and modify activities that worsen symptoms so the injury does not get worse. These exercises may be performed at home or with a therapist. For patients who experience severe symptoms, a soft padded collar may be recommended to be worn around the neck.   Improving your posture may help reduce symptoms. Posture improvement includes pulling your chin and abdomen in while sitting or standing. If you are sitting, sit in a firm chair with your buttocks against the back of the chair. While sleeping, try replacing your pillow with a small towel rolled to 2 inches in diameter, or use a cervical pillow or soft cervical collar. Poor sleeping positions delay healing.   For patients with nerve root damage, which causes numbness or weakness, the use of a cervical traction apparatus may be recommended. Surgery is rarely necessary for these injuries. However, cervical strain and sprains that are present at birth (congenital) may require surgery.  MEDICATION   · If pain medication is necessary, nonsteroidal anti-inflammatory medications, such as aspirin and ibuprofen, or other minor pain relievers, such as acetaminophen, are often recommended.  · Do not take pain medication for 7 days before surgery.  · Prescription pain relievers may be given if deemed necessary by your caregiver. Use only as directed and only as much as you   need.  HEAT AND COLD:   · Cold treatment (icing) relieves pain and reduces inflammation. Cold treatment should be applied for 10 to 15 minutes every 2 to 3 hours for inflammation and pain and immediately after any activity that aggravates your symptoms. Use ice packs or an ice massage.  · Heat treatment may be used prior to  performing the stretching and strengthening activities prescribed by your caregiver, physical therapist, or athletic trainer. Use a heat pack or a warm soak.  SEEK MEDICAL CARE IF:   · Symptoms get worse or do not improve in 2 weeks despite treatment.  · New, unexplained symptoms develop (drugs used in treatment may produce side effects).  EXERCISES  RANGE OF MOTION (ROM) AND STRETCHING EXERCISES - Cervical Strain and Sprain  These exercises may help you when beginning to rehabilitate your injury. In order to successfully resolve your symptoms, you must improve your posture. These exercises are designed to help reduce the forward-head and rounded-shoulder posture which contributes to this condition. Your symptoms may resolve with or without further involvement from your physician, physical therapist or athletic trainer. While completing these exercises, remember:   · Restoring tissue flexibility helps normal motion to return to the joints. This allows healthier, less painful movement and activity.  · An effective stretch should be held for at least 20 seconds, although you may need to begin with shorter hold times for comfort.  · A stretch should never be painful. You should only feel a gentle lengthening or release in the stretched tissue.  STRETCH- Axial Extensors  · Lie on your back on the floor. You may bend your knees for comfort. Place a rolled up hand towel or dish towel, about 2 inches in diameter, under the part of your head that makes contact with the floor.  · Gently tuck your chin, as if trying to make a "double chin," until you feel a gentle stretch at the base of your head.  · Hold __________ seconds.  Repeat __________ times. Complete this exercise __________ times per day.   STRETECH - Axial Extension   · Stand or sit on a firm surface. Assume a good posture: chest up, shoulders drawn back, abdominal muscles slightly tense, knees unlocked (if standing) and feet hip width apart.  · Slowly retract your  chin so your head slides back and your chin slightly lowers.Continue to look straight ahead.  · You should feel a gentle stretch in the back of your head. Be certain not to feel an aggressive stretch since this can cause headaches later.  · Hold for __________ seconds.  Repeat __________ times. Complete this exercise __________ times per day.  STRETCH  Cervical Side Bend   · Stand or sit on a firm surface. Assume a good posture: chest up, shoulders drawn back, abdominal muscles slightly tense, knees unlocked (if standing) and feet hip width apart.  · Without letting your nose or shoulders move, slowly tip your right / left ear to your shoulder until your feel a gentle stretch in the muscles on the opposite side of your neck.  · Hold __________ seconds.  Repeat __________ times. Complete this exercise __________ times per day.  STRETCH  Cervical Rotators   · Stand or sit on a firm surface. Assume a good posture: chest up, shoulders drawn back, abdominal muscles slightly tense, knees unlocked (if standing) and feet hip width apart.  · Keeping your eyes level with the ground, slowly turn your head until you feel a gentle   stretch along the back and opposite side of your neck.  · Hold __________ seconds.  Repeat __________ times. Complete this exercise __________ times per day.  RANGE OF MOTION - Neck Circles   · Stand or sit on a firm surface. Assume a good posture: chest up, shoulders drawn back, abdominal muscles slightly tense, knees unlocked (if standing) and feet hip width apart.  · Gently roll your head down and around from the back of one shoulder to the back of the other. The motion should never be forced or painful.  · Repeat the motion 10-20 times, or until you feel the neck muscles relax and loosen.  Repeat __________ times. Complete the exercise __________ times per day.  STRENGTHENING EXERCISES - Cervical Strain and Sprain  These exercises may help you when beginning to rehabilitate your injury. They may  resolve your symptoms with or without further involvement from your physician, physical therapist or athletic trainer. While completing these exercises, remember:   · Muscles can gain both the endurance and the strength needed for everyday activities through controlled exercises.  · Complete these exercises as instructed by your physician, physical therapist or athletic trainer. Progress the resistance and repetitions only as guided.  · You may experience muscle soreness or fatigue, but the pain or discomfort you are trying to eliminate should never worsen during these exercises. If this pain does worsen, stop and make certain you are following the directions exactly. If the pain is still present after adjustments, discontinue the exercise until you can discuss the trouble with your clinician.  STRENGTH Cervical Flexors, Isometric  · Face a wall, standing about 6 inches away. Place a small pillow, a ball about 6-8 inches in diameter, or a folded towel between your forehead and the wall.  · Slightly tuck your chin and gently push your forehead into the soft object. Push only with mild to moderate intensity, building up tension gradually. Keep your jaw and forehead relaxed.  · Hold 10 to 20 seconds. Keep your breathing relaxed.  · Release the tension slowly. Relax your neck muscles completely before you start the next repetition.  Repeat __________ times. Complete this exercise __________ times per day.  STRENGTH- Cervical Lateral Flexors, Isometric   · Stand about 6 inches away from a wall. Place a small pillow, a ball about 6-8 inches in diameter, or a folded towel between the side of your head and the wall.  · Slightly tuck your chin and gently tilt your head into the soft object. Push only with mild to moderate intensity, building up tension gradually. Keep your jaw and forehead relaxed.  · Hold 10 to 20 seconds. Keep your breathing relaxed.  · Release the tension slowly. Relax your neck muscles completely before  you start the next repetition.  Repeat __________ times. Complete this exercise __________ times per day.  STRENGTH  Cervical Extensors, Isometric   · Stand about 6 inches away from a wall. Place a small pillow, a ball about 6-8 inches in diameter, or a folded towel between the back of your head and the wall.  · Slightly tuck your chin and gently tilt your head back into the soft object. Push only with mild to moderate intensity, building up tension gradually. Keep your jaw and forehead relaxed.  · Hold 10 to 20 seconds. Keep your breathing relaxed.  · Release the tension slowly. Relax your neck muscles completely before you start the next repetition.  Repeat __________ times. Complete this exercise __________ times per   day.  POSTURE AND BODY MECHANICS CONSIDERATIONS - Cervical Strain and Sprain  Keeping correct posture when sitting, standing or completing your activities will reduce the stress put on different body tissues, allowing injured tissues a chance to heal and limiting painful experiences. The following are general guidelines for improved posture. Your physician or physical therapist will provide you with any instructions specific to your needs. While reading these guidelines, remember:  · The exercises prescribed by your provider will help you have the flexibility and strength to maintain correct postures.  · The correct posture provides the optimal environment for your joints to work. All of your joints have less wear and tear when properly supported by a spine with good posture. This means you will experience a healthier, less painful body.  · Correct posture must be practiced with all of your activities, especially prolonged sitting and standing. Correct posture is as important when doing repetitive low-stress activities (typing) as it is when doing a single heavy-load activity (lifting).  PROLONGED STANDING WHILE SLIGHTLY LEANING FORWARD  When completing a task that requires you to lean forward while  standing in one place for a long time, place either foot up on a stationary 2-4 inch high object to help maintain the best posture. When both feet are on the ground, the low back tends to lose its slight inward curve. If this curve flattens (or becomes too large), then the back and your other joints will experience too much stress, fatigue more quickly and can cause pain.   RESTING POSITIONS  Consider which positions are most painful for you when choosing a resting position. If you have pain with flexion-based activities (sitting, bending, stooping, squatting), choose a position that allows you to rest in a less flexed posture. You would want to avoid curling into a fetal position on your side. If your pain worsens with extension-based activities (prolonged standing, working overhead), avoid resting in an extended position such as sleeping on your stomach. Most people will find more comfort when they rest with their spine in a more neutral position, neither too rounded nor too arched. Lying on a non-sagging bed on your side with a pillow between your knees, or on your back with a pillow under your knees will often provide some relief. Keep in mind, being in any one position for a prolonged period of time, no matter how correct your posture, can still lead to stiffness.  WALKING  Walk with an upright posture. Your ears, shoulders and hips should all line-up.  OFFICE WORK  When working at a desk, create an environment that supports good, upright posture. Without extra support, muscles fatigue and lead to excessive strain on joints and other tissues.  CHAIR:  · A chair should be able to slide under your desk when your back makes contact with the back of the chair. This allows you to work closely.  · The chair's height should allow your eyes to be level with the upper part of your monitor and your hands to be slightly lower than your elbows.  · Body position:  · Your feet should make contact with the floor. If this is  not possible, use a foot rest.  · Keep your ears over your shoulders. This will reduce stress on your neck and low back.  Document Released: 11/09/2005 Document Revised: 03/06/2013 Document Reviewed: 02/21/2009  [employer]® Patient Information ©2014 [employer].

## 2014-03-10 NOTE — ED Notes (Signed)
Pt reports falling at her house yesterday hitting a rack in between her shoulder blades. Pt states increase back and neck pain while at work at [employer]. Pt was able to work entire shift. Pt is A&Ox4, respirations equal and unlabored, skin warm and dry
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
## 2014-03-12 NOTE — ED Provider Notes (Signed)
Medical screening examination/treatment/procedure(s) were performed by non-physician practitioner and as supervising physician I was immediately available for consultation/collaboration.   EKG Interpretation None       Sunnie NielsenBrian Simeon Vera, MD 03/12/14 [phone]

## 2014-07-12 ENCOUNTER — Other Ambulatory Visit: Payer: Self-pay

## 2014-07-13 LAB — CYTOLOGY - PAP

## 2014-09-11 ENCOUNTER — Other Ambulatory Visit: Payer: Self-pay

## 2014-09-23 ENCOUNTER — Emergency Department (HOSPITAL_BASED_OUTPATIENT_CLINIC_OR_DEPARTMENT_OTHER)
Admission: EM | Admit: 2014-09-23 | Discharge: 2014-09-23 | Disposition: A | Discharge status: Home / Self Care | Payer: Medicaid Other | Attending: Emergency Medicine | Admitting: Emergency Medicine

## 2014-09-23 ENCOUNTER — Encounter (HOSPITAL_BASED_OUTPATIENT_CLINIC_OR_DEPARTMENT_OTHER): Payer: Self-pay | Admitting: *Deleted

## 2014-09-23 ENCOUNTER — Emergency Department (HOSPITAL_BASED_OUTPATIENT_CLINIC_OR_DEPARTMENT_OTHER): Payer: Medicaid Other

## 2014-09-23 ENCOUNTER — Emergency Department (HOSPITAL_COMMUNITY)
Admission: EM | Admit: 2014-09-23 | Discharge: 2014-09-23 | Discharge status: Against Medical Advice | Payer: Medicaid Other | Attending: Emergency Medicine | Admitting: Emergency Medicine

## 2014-09-23 ENCOUNTER — Encounter (HOSPITAL_COMMUNITY): Payer: Self-pay | Admitting: Emergency Medicine

## 2014-09-23 DIAGNOSIS — R1013 Epigastric pain: Secondary | ICD-10-CM | POA: Insufficient documentation

## 2014-09-23 DIAGNOSIS — Z8669 Personal history of other diseases of the nervous system and sense organs: Secondary | ICD-10-CM | POA: Insufficient documentation

## 2014-09-23 DIAGNOSIS — F319 Bipolar disorder, unspecified: Secondary | ICD-10-CM | POA: Insufficient documentation

## 2014-09-23 DIAGNOSIS — Z3202 Encounter for pregnancy test, result negative: Secondary | ICD-10-CM | POA: Insufficient documentation

## 2014-09-23 DIAGNOSIS — Z862 Personal history of diseases of the blood and blood-forming organs and certain disorders involving the immune mechanism: Secondary | ICD-10-CM | POA: Insufficient documentation

## 2014-09-23 DIAGNOSIS — R1011 Right upper quadrant pain: Secondary | ICD-10-CM | POA: Insufficient documentation

## 2014-09-23 DIAGNOSIS — Z720 Tobacco use: Secondary | ICD-10-CM | POA: Insufficient documentation

## 2014-09-23 DIAGNOSIS — R1012 Left upper quadrant pain: Secondary | ICD-10-CM | POA: Insufficient documentation

## 2014-09-23 DIAGNOSIS — R110 Nausea: Secondary | ICD-10-CM | POA: Insufficient documentation

## 2014-09-23 DIAGNOSIS — Z79899 Other long term (current) drug therapy: Secondary | ICD-10-CM | POA: Insufficient documentation

## 2014-09-23 DIAGNOSIS — R109 Unspecified abdominal pain: Secondary | ICD-10-CM | POA: Insufficient documentation

## 2014-09-23 DIAGNOSIS — F419 Anxiety disorder, unspecified: Secondary | ICD-10-CM | POA: Insufficient documentation

## 2014-09-23 DIAGNOSIS — K219 Gastro-esophageal reflux disease without esophagitis: Secondary | ICD-10-CM | POA: Insufficient documentation

## 2014-09-23 DIAGNOSIS — R1010 Upper abdominal pain, unspecified: Secondary | ICD-10-CM

## 2014-09-23 DIAGNOSIS — M549 Dorsalgia, unspecified: Secondary | ICD-10-CM | POA: Insufficient documentation

## 2014-09-23 DIAGNOSIS — Z880 Allergy status to penicillin: Secondary | ICD-10-CM | POA: Insufficient documentation

## 2014-09-23 LAB — CBC WITH DIFFERENTIAL/PLATELET
BASOS PCT: 0 % (ref 0–1)
Basophils Absolute: 0 10*3/uL (ref 0.0–0.1)
EOS ABS: 0.1 10*3/uL (ref 0.0–0.7)
EOS PCT: 1 % (ref 0–5)
HEMATOCRIT: 38 % (ref 36.0–46.0)
Hemoglobin: 12.8 g/dL (ref 12.0–15.0)
Lymphocytes Relative: 30 % (ref 12–46)
Lymphs Abs: 3.2 10*3/uL (ref 0.7–4.0)
MCH: 29 pg (ref 26.0–34.0)
MCHC: 33.7 g/dL (ref 30.0–36.0)
MCV: 86 fL (ref 78.0–100.0)
MONO ABS: 0.7 10*3/uL (ref 0.1–1.0)
MONOS PCT: 7 % (ref 3–12)
NEUTROS ABS: 6.7 10*3/uL (ref 1.7–7.7)
Neutrophils Relative %: 62 % (ref 43–77)
Platelets: 251 10*3/uL (ref 150–400)
RBC: 4.42 MIL/uL (ref 3.87–5.11)
RDW: 15.2 % (ref 11.5–15.5)
WBC: 10.8 10*3/uL — ABNORMAL HIGH (ref 4.0–10.5)

## 2014-09-23 LAB — URINALYSIS, ROUTINE W REFLEX MICROSCOPIC
Glucose, UA: NEGATIVE mg/dL
Glucose, UA: NEGATIVE mg/dL
HGB URINE DIPSTICK: NEGATIVE
HGB URINE DIPSTICK: NEGATIVE
KETONES UR: 15 mg/dL — AB
Ketones, ur: NEGATIVE mg/dL
Nitrite: NEGATIVE
Nitrite: NEGATIVE
PH: 5.5 (ref 5.0–8.0)
PH: 5.5 (ref 5.0–8.0)
PROTEIN: NEGATIVE mg/dL
Protein, ur: NEGATIVE mg/dL
SPECIFIC GRAVITY, URINE: 1.017 (ref 1.005–1.030)
Specific Gravity, Urine: 1.025 (ref 1.005–1.030)
UROBILINOGEN UA: 0.2 mg/dL (ref 0.0–1.0)
Urobilinogen, UA: 0.2 mg/dL (ref 0.0–1.0)

## 2014-09-23 LAB — BASIC METABOLIC PANEL
Anion gap: 14 (ref 5–15)
BUN: 7 mg/dL (ref 6–23)
CALCIUM: 8.9 mg/dL (ref 8.4–10.5)
CHLORIDE: 102 meq/L (ref 96–112)
CO2: 22 mEq/L (ref 19–32)
CREATININE: 0.62 mg/dL (ref 0.50–1.10)
GFR calc non Af Amer: >90 mL/min (ref >90)
Glucose, Bld: 95 mg/dL (ref 70–99)
Potassium: 3.9 mEq/L (ref 3.7–5.3)
Sodium: 138 mEq/L (ref 137–147)

## 2014-09-23 LAB — URINE MICROSCOPIC-ADD ON

## 2014-09-23 LAB — LIPASE, BLOOD
Lipase: 33 U/L (ref 11–59)
Lipase: 33 U/L (ref 11–59)

## 2014-09-23 LAB — PREGNANCY, URINE: PREG TEST UR: NEGATIVE

## 2014-09-23 MED ORDER — HYDROCODONE-ACETAMINOPHEN 5-325 MG PO TABS
2.0000 | ORAL_TABLET | Freq: Once | ORAL | Status: AC
  Administered 2014-09-23: 2 via ORAL
  Filled 2014-09-23: qty 2

## 2014-09-23 MED ORDER — GI COCKTAIL ~~LOC~~
30.0000 mL | Freq: Once | ORAL | Status: AC
  Administered 2014-09-23: 30 mL via ORAL
  Filled 2014-09-23: qty 30

## 2014-09-23 NOTE — ED Notes (Signed)
Pt. Stated, I've had abdominal pain and it goes to my back.  It starts on the right and goes around.

## 2014-09-23 NOTE — ED Notes (Signed)
For the last week or two patient has had abd pain in LUQ, states it typically starts after eating.
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
## 2014-09-23 NOTE — Discharge Instructions (Signed)
Abdominal Pain We suspect that you may have an ulcer or gastritis which is an irritation of your stomach. Continue omeprazole. Take Maalox 2 tablespoons after meals and at bedtime. Call Dr.Mann tomorrow to schedule next available office appointment Many things can cause belly (abdominal) pain. Most times, the belly pain is not dangerous. Many cases of belly pain can be watched and treated at home. HOME CARE   Do not take medicines that help you go poop (laxatives) unless told to by your doctor.  Only take medicine as told by your doctor.  Eat or drink as told by your doctor. Your doctor will tell you if you should be on a special diet. GET HELP IF:  You do not know what is causing your belly pain.  You have belly pain while you are sick to your stomach (nauseous) or have runny poop (diarrhea).  You have pain while you pee or poop.  Your belly pain wakes you up at night.  You have belly pain that gets worse or better when you eat.  You have belly pain that gets worse when you eat fatty foods.  You have a fever. GET HELP RIGHT AWAY IF:   The pain does not go away within 2 hours.  You keep throwing up (vomiting).  The pain changes and is only in the right or left part of the belly.  You have bloody or tarry looking poop. MAKE SURE YOU:   Understand these instructions.  Will watch your condition.  Will get help right away if you are not doing well or get worse. Document Released: 04/27/2008 Document Revised: 11/14/2013 Document Reviewed: 07/19/2013 [facility] Patient Information 2015 [place], [place]. This information is not intended to replace advice given to you by your health care provider. Make sure you discuss any questions you have with your health care provider.

## 2014-09-24 ENCOUNTER — Encounter (HOSPITAL_BASED_OUTPATIENT_CLINIC_OR_DEPARTMENT_OTHER): Payer: Self-pay | Admitting: *Deleted

## 2015-02-20 ENCOUNTER — Encounter (HOSPITAL_BASED_OUTPATIENT_CLINIC_OR_DEPARTMENT_OTHER): Payer: Self-pay | Admitting: *Deleted

## 2015-02-20 ENCOUNTER — Emergency Department (HOSPITAL_BASED_OUTPATIENT_CLINIC_OR_DEPARTMENT_OTHER)
Admission: EM | Admit: 2015-02-20 | Discharge: 2015-02-20 | Disposition: A | Discharge status: Home / Self Care | Payer: Medicaid Other | Attending: Emergency Medicine | Admitting: Emergency Medicine

## 2015-02-20 DIAGNOSIS — Z880 Allergy status to penicillin: Secondary | ICD-10-CM | POA: Insufficient documentation

## 2015-02-20 DIAGNOSIS — K219 Gastro-esophageal reflux disease without esophagitis: Secondary | ICD-10-CM | POA: Insufficient documentation

## 2015-02-20 DIAGNOSIS — Z8669 Personal history of other diseases of the nervous system and sense organs: Secondary | ICD-10-CM | POA: Insufficient documentation

## 2015-02-20 DIAGNOSIS — Z720 Tobacco use: Secondary | ICD-10-CM | POA: Insufficient documentation

## 2015-02-20 DIAGNOSIS — F419 Anxiety disorder, unspecified: Secondary | ICD-10-CM | POA: Insufficient documentation

## 2015-02-20 DIAGNOSIS — Z862 Personal history of diseases of the blood and blood-forming organs and certain disorders involving the immune mechanism: Secondary | ICD-10-CM | POA: Insufficient documentation

## 2015-02-20 DIAGNOSIS — Z760 Encounter for issue of repeat prescription: Secondary | ICD-10-CM

## 2015-02-20 DIAGNOSIS — F319 Bipolar disorder, unspecified: Secondary | ICD-10-CM | POA: Insufficient documentation

## 2015-02-20 MED ORDER — VENLAFAXINE HCL ER 150 MG PO CP24: 150.0000 mg | ORAL_CAPSULE | Freq: Every day | ORAL | Status: DC
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
## 2015-02-20 NOTE — ED Notes (Signed)
Pt sts her medicaid ran out last Nov. She then ran out of her meds and wasn't able to get more. She sts she needs to get back on her meds to help stabilize her moods.

## 2015-02-20 NOTE — ED Notes (Signed)
MD at bedside. 

## 2015-02-20 NOTE — ED Notes (Addendum)
Pt requests a medication "to help me manage my anger". Pt states she has been on many psych medications, and effexor worked best "but I don't want to stabilize my moods...". Pt is unsure what psych meds have worked for her in the past, and does not have a mental health care provider. Pt encouraged to see family services to request counseling and medication management. Pt denies any si or hi, or any anger at this time, pleasant and cooperative with assessment.

## 2015-02-20 NOTE — Discharge Instructions (Signed)
Resource guide provided below to help you find behavioral health follow-up. Take medications as directed. Return for any new or worse symptoms.   Emergency Department Resource Guide 1) Find a Doctor and Pay Out of Pocket Although you won't have to find out who is covered by your insurance plan, it is a good idea to ask around and get recommendations. You will then need to call the office and see if the doctor you have chosen will accept you as a new patient and what types of options they offer for patients who are self-pay. Some doctors offer discounts or will set up payment plans for their patients who do not have insurance, but you will need to ask so you aren't surprised when you get to your appointment.  2) Contact Your Local Health Department Not all health departments have doctors that can see patients for sick visits, but many do, so it is worth a call to see if yours does. If you don't know where your local health department is, you can check in your phone book. The CDC also has a tool to help you locate your state's health department, and many state websites also have listings of all of their local health departments.  3) Find a Walk-in Clinic If your illness is not likely to be very severe or complicated, you may want to try a walk in clinic. These are popping up all over the country in pharmacies, drugstores, and shopping centers. They're usually staffed by nurse practitioners or physician assistants that have been trained to treat common illnesses and complaints. They're usually fairly quick and inexpensive. However, if you have serious medical issues or chronic medical problems, these are probably not your best option.  No Primary Care Doctor: - Call Health Connect at  [phone] - they can help you locate a primary care doctor that  accepts your insurance, provides certain services, etc. - Physician Referral Service- [phone]  Chronic Pain Problems: Organization          Address  Phone   Notes  [facility]  [phone] Patients need to be referred by their primary care doctor.   Medication Assistance: Organization         Address  Phone   Notes  [facility] [address] [place], [place] [address] [phone]) [phone] --Must be a resident of [facility] -- Must have NO insurance coverage whatsoever (no Medicaid/ Medicare, etc.) -- The pt. MUST have a primary care doctor that directs their care regularly and follows them in the community   MedAssist  [phone]   [employer]  [phone]    Agencies that provide inexpensive medical care: Organization         Address  Phone   Notes  [facility]  [phone]   [facility]    [phone]   [facility] [address] [place], [place] [address] [phone]   [facility] [address], [place] [phone]   [facility]anned Parenthood    [phone]   [facility]    [phone]   [facility]  [address], [place] Phone:  [phone]) [phone], Fax:  [phone] Hours of Operation:  9 am - 6 pm, M-F.  Also accepts Medicaid/Medicare and self-pay.  [facility]  [address], [place] Phone: [phone],  Fax: [phone]. Hours of Operation:  8:30 am - 5:30 pm, M-F.  Also accepts Medicaid and self-pay.  [facility] [place] [address], [place] Phone: [phone]   [place], [place], [place] [phone], Ext. [phone] Mondays & Thursdays: 7-9 AM.  First 15 patients are seen on a first come, first serve basis.    [place] Providers:  Organization         Address  Phone   Notes  [place] [address], [place] [phone] Also accepts self-pay patients.  [facility] [address], [place]  [phone]   [place], [address], [place] [phone]   [facility] [address] [place] [phone]   Lucianne Lei [address], [place]   [phone] Only accepts [place] Access [place] patients after they have their name applied to their card.   Self-Pay (no insurance) in [facility]:  Organization         Address  Phone   Notes  Sickle Cell Patients, [facility] [place] [phone]   [facility] [place] [phone]   [facility] [place]  [place], [place], [place] [phone]   [facility]/Dr. Osei-Bonsu  [address], [place] or [address], [place] [phone] Phone number for both [place] and [place] locations is the same.  [facility] [address], [place] [phone]   [facility] [address] [place] or [address] [phone]   [facility] [address] [place] [phone], phone; [phone], fax Sees patients 1st and 3rd Saturday of every month.  Must not qualify for public or private insurance (i.e. Medicaid, Medicare, [place] Health Choice, Veterans' Benefits)  Household income should be no more than 200% of the poverty level The clinic cannot treat you if you are pregnant or think you are pregnant  Sexually transmitted diseases are not treated at the clinic.    Dental Care: Organization         Address  Phone  Notes  [facility] [place] [phone] Accepts children up to age 36 who are enrolled in [place] or [place]; pregnant women with a Medicaid card; and  children who have applied for Medicaid or [place] Health Choice, but were declined, whose parents can pay a reduced fee at time of service.  [facility] Department of [facility]  [address], [place] [phone] Accepts children up to age 67 who are enrolled in [place] or [place]; pregnant women with a Medicaid card; and children who have applied for Medicaid or [place] Health Choice, but were declined, whose parents can pay a reduced fee at time of service.  [place] Adult [facility] Access PROGRAM  [place] [phone] Patients are seen by appointment only. Walk-ins are not accepted. [place] will see patients 26 years of age and older. Monday - Tuesday (8am-5pm) Most Wednesdays (8:30-5pm) $30 per visit, cash only  [facility] [facility] [employer] PRO[facility]  [address], [employer] (  [phone] Patients are seen by appointment only. Walk-ins are not accepted. [place] will see patients 54 years of age and older. One Wednesday Evening (Monthly: Volunteer Based).  $30 per visit, cash only  [place]  [phone] for adults; Children under age 63, call Graduate Pediatric Dentistry at [phone]. Children aged 44-14, please call [phone] to request a pediatric application.  Dental services are provided in all areas of dental care including fillings, crowns and bridges, complete and partial dentures, implants, gum treatment, root canals, and extractions. Preventive care is also provided. Treatment is provided to both adults and children. Patients are selected via a lottery and there is often a waiting list.   [facility] [address] [place]  [phone] [email or web address]   [facility] [address] [place], [place] [phone], Ext. [phone] Second and Fourth Thursday of each month, opens at 6:30 AM; Clinic ends at 9 AM.  Patients are seen on a first-come first-served  basis, and a limited number are seen during each clinic.   [facility]  [address] Hillard Danker [place], [place] [phone]   Eligibility Requirements You must have lived in [place], [place], or [place] counties for at least the last three months.   You cannot be eligible for state or federal sponsored [employer], including [employer], [place], or [employer].   You generally cannot be eligible for healthcare insurance through your employer.    How to apply: Eligibility screenings are held every Tuesday and Wednesday afternoon from 1:00 pm until 4:00 pm. You do not need an appointment for the interview!  [facility] [address], [place], [place]   [place]  [place] Department  [place]  [phone]    Behavioral Health Resources in the Community: Intensive Outpatient Programs Organization         Address  Phone  Notes  [place]. [address], [place], [place] [phone]   [facility] [address] [place], [place]   ADS: Alcohol & Drug Svcs [address], [place], [place]   [place] [address]  [place], [place] or [phone]   Substance Abuse Resources Organization         Address  Phone  Notes  Alcohol and Drug Services  [phone]   [place]  [phone]   The [place]   Chinita Pester  [phone]   Residential & Outpatient Substance Abuse Program  [phone]   Psychological Services Organization         Address  Phone  Notes  [facility]  [place]  [phone]   [place] [address], [place] or [phone]    Mobile Crisis Teams Organization          Address  Phone  Notes  [facility], Mobile Crisis Care Unit  [phone]   [facility]  [address] [place], [place]   [place] [address] [place] [phone]    Self-Help/Support Groups Organization         Address  Phone             Notes  [place]. of [place] - variety of support groups  [phone]- H3156881 Call for more information  Narcotics Anonymous (NA), [facility] [address], [employer] [place]  2 meetings at this location   Residential Facilities manager         Address  Phone  Notes  [facility] [place],    [place]  [phone]   [facility]  [address], [place] [id number], [place], [place]   [place], [place] [phone] Admissions: 8am-3pm M-F  Incentives Substance [place] [address]    [employer], [place] [phone]   [facility] [address] [place], [place], [place]   [facility] [address],  [place], [place]   [facility] - Intensive Outpatient [address]een [address], [place], [place]   [facility] ([place].,  [place], [place] [phone] or [phone]   [facility] (RTS) [address], [place], [place] Accepts Medicaid  [facility] [address]  [place] [phone] Substance Abuse/Addiction Treatment   [facility] Organization         Address  Phone  Notes  [facility]  [phone]   Domenic Schwab, PhD [address] Arlis Porta [place], [place]   [phone] or [phone]   [place], [place] [phone]   [facility] [address], [place], [place] [phone] Insurance/Medicaid/sponsorship  through [facility] Families [address]                                    [place], [place] [phone] [place] [address], [place] [phone]    Dr. Adele Schilder  [phone]   [facility] Dept. 1) [address] [place] 2) [place] 3)  [place] [address], [place] [phone]  [phone]   [place] [phone] or [phone] (After Hours)

## 2015-02-20 NOTE — ED Notes (Signed)
Pt pacing in the hall, states "you all weren't even busy when I checked in! This is crazy to have to wait this long!" explained to pt that edp has been busy with a very sick pediatric patient, and is seeing patients as quickly as he can. When asked if there is anything I can do while she waits, pt states "Just get him in here! I'm sick and tired of waiting!"

## 2015-08-12 ENCOUNTER — Emergency Department (HOSPITAL_COMMUNITY)
Admission: EM | Admit: 2015-08-12 | Discharge: 2015-08-13 | Disposition: A | Discharge status: Home / Self Care | Payer: Medicaid Other | Attending: Emergency Medicine | Admitting: Emergency Medicine

## 2015-08-12 ENCOUNTER — Encounter (HOSPITAL_COMMUNITY): Payer: Self-pay | Admitting: Emergency Medicine

## 2015-08-12 DIAGNOSIS — Z862 Personal history of diseases of the blood and blood-forming organs and certain disorders involving the immune mechanism: Secondary | ICD-10-CM | POA: Insufficient documentation

## 2015-08-12 DIAGNOSIS — Z3202 Encounter for pregnancy test, result negative: Secondary | ICD-10-CM | POA: Insufficient documentation

## 2015-08-12 DIAGNOSIS — K219 Gastro-esophageal reflux disease without esophagitis: Secondary | ICD-10-CM | POA: Insufficient documentation

## 2015-08-12 DIAGNOSIS — Z87820 Personal history of traumatic brain injury: Secondary | ICD-10-CM | POA: Insufficient documentation

## 2015-08-12 DIAGNOSIS — N760 Acute vaginitis: Secondary | ICD-10-CM | POA: Insufficient documentation

## 2015-08-12 DIAGNOSIS — B9689 Other specified bacterial agents as the cause of diseases classified elsewhere: Secondary | ICD-10-CM

## 2015-08-12 DIAGNOSIS — Z880 Allergy status to penicillin: Secondary | ICD-10-CM | POA: Insufficient documentation

## 2015-08-12 DIAGNOSIS — M546 Pain in thoracic spine: Secondary | ICD-10-CM | POA: Insufficient documentation

## 2015-08-12 DIAGNOSIS — K5900 Constipation, unspecified: Secondary | ICD-10-CM | POA: Insufficient documentation

## 2015-08-12 DIAGNOSIS — R062 Wheezing: Secondary | ICD-10-CM | POA: Insufficient documentation

## 2015-08-12 DIAGNOSIS — Z79899 Other long term (current) drug therapy: Secondary | ICD-10-CM | POA: Insufficient documentation

## 2015-08-12 DIAGNOSIS — Z8659 Personal history of other mental and behavioral disorders: Secondary | ICD-10-CM | POA: Insufficient documentation

## 2015-08-12 DIAGNOSIS — Z8669 Personal history of other diseases of the nervous system and sense organs: Secondary | ICD-10-CM | POA: Insufficient documentation

## 2015-08-12 DIAGNOSIS — Z720 Tobacco use: Secondary | ICD-10-CM | POA: Insufficient documentation

## 2015-08-12 LAB — URINE MICROSCOPIC-ADD ON

## 2015-08-12 LAB — URINALYSIS, ROUTINE W REFLEX MICROSCOPIC
Bilirubin Urine: NEGATIVE
Glucose, UA: NEGATIVE mg/dL
KETONES UR: NEGATIVE mg/dL
LEUKOCYTES UA: NEGATIVE
NITRITE: NEGATIVE
Protein, ur: NEGATIVE mg/dL
Specific Gravity, Urine: <1.005 — ABNORMAL LOW (ref 1.005–1.030)
Urobilinogen, UA: 0.2 mg/dL (ref 0.0–1.0)
pH: 6 (ref 5.0–8.0)

## 2015-08-12 LAB — PREGNANCY, URINE: PREG TEST UR: NEGATIVE

## 2015-08-12 MED ORDER — ONDANSETRON 4 MG PO TBDP
4.0000 mg | ORAL_TABLET | Freq: Once | ORAL | Status: AC
  Administered 2015-08-13: 4 mg via ORAL
  Filled 2015-08-12: qty 1

## 2015-08-12 MED ORDER — CYCLOBENZAPRINE HCL 10 MG PO TABS
10.0000 mg | ORAL_TABLET | Freq: Once | ORAL | Status: AC
  Administered 2015-08-13: 10 mg via ORAL
  Filled 2015-08-12: qty 1
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
## 2015-08-12 NOTE — ED Notes (Signed)
Pt c/o upper abd pain, urinary frequency, and middle back pain.

## 2015-08-13 LAB — CBC WITH DIFFERENTIAL/PLATELET
BASOS PCT: 0 %
Basophils Absolute: 0 10*3/uL (ref 0.0–0.1)
Eosinophils Absolute: 0.1 10*3/uL (ref 0.0–0.7)
Eosinophils Relative: 1 %
HEMATOCRIT: 36.1 % (ref 36.0–46.0)
Hemoglobin: 12.1 g/dL (ref 12.0–15.0)
Lymphocytes Relative: 26 %
Lymphs Abs: 3.5 10*3/uL (ref 0.7–4.0)
MCH: 28.7 pg (ref 26.0–34.0)
MCHC: 33.5 g/dL (ref 30.0–36.0)
MCV: 85.5 fL (ref 78.0–100.0)
MONO ABS: 1.1 10*3/uL — AB (ref 0.1–1.0)
Monocytes Relative: 8 %
Neutro Abs: 8.6 10*3/uL — ABNORMAL HIGH (ref 1.7–7.7)
Neutrophils Relative %: 65 %
PLATELETS: 229 10*3/uL (ref 150–400)
RBC: 4.22 MIL/uL (ref 3.87–5.11)
RDW: 16 % — AB (ref 11.5–15.5)
WBC: 13.4 10*3/uL — ABNORMAL HIGH (ref 4.0–10.5)

## 2015-08-13 LAB — WET PREP, GENITAL
TRICH WET PREP: NONE SEEN
Yeast Wet Prep HPF POC: NONE SEEN

## 2015-08-13 LAB — COMPREHENSIVE METABOLIC PANEL
ALBUMIN: 4.3 g/dL (ref 3.5–5.0)
ALT: 15 U/L (ref 14–54)
ANION GAP: 7 (ref 5–15)
AST: 19 U/L (ref 15–41)
Alkaline Phosphatase: 76 U/L (ref 38–126)
BILIRUBIN TOTAL: 0.2 mg/dL — AB (ref 0.3–1.2)
BUN: 14 mg/dL (ref 6–20)
CALCIUM: 8.8 mg/dL — AB (ref 8.9–10.3)
CO2: 23 mmol/L (ref 22–32)
Chloride: 106 mmol/L (ref 101–111)
Creatinine, Ser: 0.92 mg/dL (ref 0.44–1.00)
GFR calc Af Amer: >60 mL/min (ref >60)
GFR calc non Af Amer: >60 mL/min (ref >60)
GLUCOSE: 107 mg/dL — AB (ref 65–99)
POTASSIUM: 3.3 mmol/L — AB (ref 3.5–5.1)
SODIUM: 136 mmol/L (ref 135–145)
TOTAL PROTEIN: 7.6 g/dL (ref 6.5–8.1)

## 2015-08-13 MED ORDER — ONDANSETRON 4 MG PO TBDP: 4.0000 mg | ORAL_TABLET | Freq: Three times a day (TID) | ORAL | Status: DC | PRN

## 2015-08-13 MED ORDER — FAMOTIDINE IN NACL 20-0.9 MG/50ML-% IV SOLN
20.0000 mg | Freq: Once | INTRAVENOUS | Status: AC
  Administered 2015-08-13: 20 mg via INTRAVENOUS
  Filled 2015-08-13: qty 50

## 2015-08-13 MED ORDER — HYDROCODONE-ACETAMINOPHEN 5-325 MG PO TABS
1.0000 | ORAL_TABLET | Freq: Once | ORAL | Status: AC
  Administered 2015-08-13: 1 via ORAL
  Filled 2015-08-13: qty 1

## 2015-08-13 MED ORDER — CYCLOBENZAPRINE HCL 10 MG PO TABS: 10.0000 mg | ORAL_TABLET | Freq: Two times a day (BID) | ORAL | Status: DC | PRN

## 2015-08-13 MED ORDER — METRONIDAZOLE 500 MG PO TABS: 500.0000 mg | ORAL_TABLET | Freq: Two times a day (BID) | ORAL | Status: DC

## 2015-08-13 NOTE — Discharge Instructions (Signed)
Call Dr. Rayna Sexton office for a follow up appointment for your abdominal pain. Take the medications as directed. Do not drink alcohol while taking the Flagyl. Return as needed.

## 2015-08-14 LAB — URINE CULTURE

## 2015-08-14 LAB — GC/CHLAMYDIA PROBE AMP (~~LOC~~) NOT AT ARMC
Chlamydia: NEGATIVE
Neisseria Gonorrhea: NEGATIVE

## 2015-11-11 ENCOUNTER — Encounter (HOSPITAL_COMMUNITY): Payer: Self-pay | Admitting: Family Medicine

## 2015-11-11 ENCOUNTER — Emergency Department (HOSPITAL_COMMUNITY): Payer: Medicaid Other

## 2015-11-11 ENCOUNTER — Emergency Department (HOSPITAL_COMMUNITY)
Admission: EM | Admit: 2015-11-11 | Discharge: 2015-11-11 | Disposition: A | Discharge status: Home / Self Care | Payer: Medicaid Other | Attending: Emergency Medicine | Admitting: Emergency Medicine

## 2015-11-11 DIAGNOSIS — O9989 Other specified diseases and conditions complicating pregnancy, childbirth and the puerperium: Secondary | ICD-10-CM | POA: Diagnosis not present

## 2015-11-11 DIAGNOSIS — O99331 Smoking (tobacco) complicating pregnancy, first trimester: Secondary | ICD-10-CM | POA: Diagnosis not present

## 2015-11-11 DIAGNOSIS — M546 Pain in thoracic spine: Secondary | ICD-10-CM | POA: Diagnosis not present

## 2015-11-11 DIAGNOSIS — Z880 Allergy status to penicillin: Secondary | ICD-10-CM | POA: Diagnosis not present

## 2015-11-11 DIAGNOSIS — Z79899 Other long term (current) drug therapy: Secondary | ICD-10-CM | POA: Diagnosis not present

## 2015-11-11 DIAGNOSIS — R1084 Generalized abdominal pain: Secondary | ICD-10-CM

## 2015-11-11 DIAGNOSIS — Z3490 Encounter for supervision of normal pregnancy, unspecified, unspecified trimester: Secondary | ICD-10-CM

## 2015-11-11 DIAGNOSIS — O26899 Other specified pregnancy related conditions, unspecified trimester: Secondary | ICD-10-CM

## 2015-11-11 DIAGNOSIS — Z862 Personal history of diseases of the blood and blood-forming organs and certain disorders involving the immune mechanism: Secondary | ICD-10-CM | POA: Insufficient documentation

## 2015-11-11 DIAGNOSIS — F17210 Nicotine dependence, cigarettes, uncomplicated: Secondary | ICD-10-CM | POA: Insufficient documentation

## 2015-11-11 DIAGNOSIS — R109 Unspecified abdominal pain: Secondary | ICD-10-CM

## 2015-11-11 LAB — COMPREHENSIVE METABOLIC PANEL
ALT: 15 U/L (ref 14–54)
AST: 21 U/L (ref 15–41)
Albumin: 3.6 g/dL (ref 3.5–5.0)
Alkaline Phosphatase: 65 U/L (ref 38–126)
Anion gap: 7 (ref 5–15)
BUN: 5 mg/dL — AB (ref 6–20)
CHLORIDE: 110 mmol/L (ref 101–111)
CO2: 21 mmol/L — AB (ref 22–32)
Calcium: 8.9 mg/dL (ref 8.9–10.3)
Creatinine, Ser: 0.62 mg/dL (ref 0.44–1.00)
Glucose, Bld: 95 mg/dL (ref 65–99)
POTASSIUM: 4 mmol/L (ref 3.5–5.1)
SODIUM: 138 mmol/L (ref 135–145)
Total Bilirubin: 0.4 mg/dL (ref 0.3–1.2)
Total Protein: 6.5 g/dL (ref 6.5–8.1)

## 2015-11-11 LAB — URINALYSIS, ROUTINE W REFLEX MICROSCOPIC
BILIRUBIN URINE: NEGATIVE
GLUCOSE, UA: NEGATIVE mg/dL
HGB URINE DIPSTICK: NEGATIVE
Ketones, ur: NEGATIVE mg/dL
Nitrite: NEGATIVE
PROTEIN: NEGATIVE mg/dL
Specific Gravity, Urine: 1.016 (ref 1.005–1.030)
pH: 6 (ref 5.0–8.0)

## 2015-11-11 LAB — I-STAT BETA HCG BLOOD, ED (MC, WL, AP ONLY): HCG, QUANTITATIVE: 1363.8 m[IU]/mL — AB (ref <5)

## 2015-11-11 LAB — CBC
HEMATOCRIT: 32.9 % — AB (ref 36.0–46.0)
Hemoglobin: 10.7 g/dL — ABNORMAL LOW (ref 12.0–15.0)
MCH: 27.6 pg (ref 26.0–34.0)
MCHC: 32.5 g/dL (ref 30.0–36.0)
MCV: 84.8 fL (ref 78.0–100.0)
Platelets: 218 10*3/uL (ref 150–400)
RBC: 3.88 MIL/uL (ref 3.87–5.11)
RDW: 15.9 % — ABNORMAL HIGH (ref 11.5–15.5)
WBC: 8.8 10*3/uL (ref 4.0–10.5)

## 2015-11-11 LAB — URINE MICROSCOPIC-ADD ON

## 2015-11-11 LAB — LIPASE, BLOOD: LIPASE: 27 U/L (ref 11–51)

## 2015-11-11 NOTE — Discharge Instructions (Signed)
Please read and follow all provided instructions.  Your diagnoses today include:  1. Bilateral thoracic back pain   2. Abdominal pain in pregnancy   3. Generalized abdominal pain   4. Pregnancy     Tests performed today include:  Ultrasound - pregnancy is too early to see  Blood counts and electrolytes - normal  Vital signs. See below for your results today.   Medications prescribed:   None  Take any prescribed medications only as directed.  Home care instructions:  Follow any educational materials contained in this packet.  BE VERY CAREFUL not to take multiple medicines containing Tylenol (also called acetaminophen). Doing so can lead to an overdose which can damage your liver and cause liver failure and possibly death.   Follow-up instructions: Please follow-up with your primary care provider in the next 3 days for further evaluation of your symptoms.   Return instructions:   Please return to the Emergency Department if you experience worsening symptoms.   Return with worsening pain, fever, passing out episode or other concerns  Please return if you have any other emergent concerns.  Additional Information:  Your vital signs today were: BP 106/73 mmHg   Pulse 80   Temp(Src) 98.2 F (36.8 C) (Oral)   Resp 18   SpO2 100%   LMP 10/09/2015 If your blood pressure (BP) was elevated above 135/85 this visit, please have this repeated by your doctor within one month. --------------

## 2015-11-11 NOTE — ED Notes (Signed)
Pt here for abd pain, nausea and back pain that's been going on for a few weeks. sts worsening last night at work.

## 2015-11-11 NOTE — ED Notes (Signed)
Patient transported to Ultrasound 
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
## 2015-11-12 ENCOUNTER — Telehealth: Payer: Self-pay | Admitting: Advanced Practice Midwife

## 2015-11-12 LAB — URINE CULTURE

## 2015-11-12 NOTE — Telephone Encounter (Signed)
Returned pt call.  [facility] in pregnancy and had quant hcg drawn and US, no IUP confirmed so [facility].  Pt was unclear of plan and what the lab would indicate. She reports mild pain but no change in abdominal pain and no bleeding today. Discussed plan to [facility], looking for appropriate rise for IUP.  Discussed that pt would wait in [facility] lobby until results obtained and that provider would present results and make follow up plan with pt.  Pt states understanding and will keep clinic appt tomorrow.

## 2015-11-13 ENCOUNTER — Other Ambulatory Visit: Payer: Medicaid Other | Admitting: General Practice

## 2015-11-13 DIAGNOSIS — O26899 Other specified pregnancy related conditions, unspecified trimester: Secondary | ICD-10-CM

## 2015-11-13 DIAGNOSIS — R109 Unspecified abdominal pain: Principal | ICD-10-CM

## 2015-11-13 LAB — HCG, QUANTITATIVE, PREGNANCY: HCG, BETA CHAIN, QUANT, S: 3448 m[IU]/mL — AB (ref <5)

## 2015-11-13 NOTE — Progress Notes (Addendum)
Patient here today for bhcg 48 hour f/u. Patient denies abdominal pain today. Reviewed patient and bhcg with Dr Macon LargeAnyanwu who recommends f/u ultrasound in one week. Ectopic precautions given and informed patient of appt on 12/28 @ 1pm. Patient had no questions   Attending Attestation: I was consulted about this patient and agree with the documentation above.  Jaynie CollinsUGONNA  ANYANWU, MD, FACOG Attending Obstetrician & Gynecologist, [facility]

## 2015-11-20 ENCOUNTER — Encounter (HOSPITAL_COMMUNITY): Payer: Self-pay

## 2015-11-20 ENCOUNTER — Ambulatory Visit (HOSPITAL_COMMUNITY)
Admission: RE | Admit: 2015-11-20 | Discharge: 2015-11-20 | Disposition: A | Discharge status: Home / Self Care | Payer: Medicaid Other | Source: Ambulatory Visit | Attending: Obstetrics & Gynecology | Admitting: Obstetrics & Gynecology

## 2015-11-20 ENCOUNTER — Ambulatory Visit (INDEPENDENT_AMBULATORY_CARE_PROVIDER_SITE_OTHER): Payer: Medicaid Other | Admitting: Family

## 2015-11-20 DIAGNOSIS — R109 Unspecified abdominal pain: Principal | ICD-10-CM

## 2015-11-20 DIAGNOSIS — O26899 Other specified pregnancy related conditions, unspecified trimester: Secondary | ICD-10-CM

## 2015-11-20 DIAGNOSIS — O9989 Other specified diseases and conditions complicating pregnancy, childbirth and the puerperium: Secondary | ICD-10-CM | POA: Diagnosis not present

## 2015-11-20 DIAGNOSIS — O30041 Twin pregnancy, dichorionic/diamniotic, first trimester: Secondary | ICD-10-CM
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
## 2015-11-24 NOTE — L&D Delivery Note (Signed)
  Arleatha, Winch [[id number]]  Delivery Note At [phone]  a viable female twin A was delivered via  (Presentation:vertex  ; LOA ).  APGAR:8 ,9 ; weight  2010.  Twin B via vertex at 0944, LOA. Apgars 8/8/9. Perineum intact. Placenta status:spont ,3vc .  Cord:3vc  with the following complications:none .  Cord pH: n/a  Anesthesia:  epidural Episiotomy:  none Lacerations: none  Suture Repair: n/a Est. Blood Loss 150 (mL):    Mom to postpartum.  Baby to Couplet care / Skin to Skin.  Wyvonnia Dusky 06/14/2016, 9:05 AM     Inez Catalina [[id number]]  Delivery Note At [phone]  a viable female was delivered via  (Presentation:vertex ; LOA ).  APGAR: 8,8,9 ; weight  .   Placenta status:spont , .  Cord:3vc  with the following complications:none .  Cord pH: n/a  Anesthesia:  epidural Episiotomy:  none Lacerations:  none Suture Repair: n/a Est. Blood Loss 150(mL):    Mom to postpartum.  Baby to Couplet care / Skin to Skin.  Wyvonnia Dusky 06/14/2016, 9:05 AM

## 2015-12-02 ENCOUNTER — Other Ambulatory Visit: Payer: Self-pay | Admitting: Obstetrics & Gynecology

## 2015-12-02 DIAGNOSIS — O3680X0 Pregnancy with inconclusive fetal viability, not applicable or unspecified: Secondary | ICD-10-CM

## 2015-12-02 DIAGNOSIS — O30049 Twin pregnancy, dichorionic/diamniotic, unspecified trimester: Secondary | ICD-10-CM

## 2015-12-03 ENCOUNTER — Encounter: Payer: Self-pay | Admitting: Women's Health

## 2015-12-03 ENCOUNTER — Ambulatory Visit (INDEPENDENT_AMBULATORY_CARE_PROVIDER_SITE_OTHER): Payer: Medicaid Other

## 2015-12-03 ENCOUNTER — Ambulatory Visit (INDEPENDENT_AMBULATORY_CARE_PROVIDER_SITE_OTHER): Payer: Medicaid Other | Admitting: Women's Health

## 2015-12-03 VITALS — BP 130/76 | HR 74 | Ht 63.0 in | Wt 191.5 lb

## 2015-12-03 DIAGNOSIS — O09891 Supervision of other high risk pregnancies, first trimester: Secondary | ICD-10-CM

## 2015-12-03 DIAGNOSIS — O3680X0 Pregnancy with inconclusive fetal viability, not applicable or unspecified: Secondary | ICD-10-CM

## 2015-12-03 DIAGNOSIS — Z1389 Encounter for screening for other disorder: Secondary | ICD-10-CM | POA: Diagnosis not present

## 2015-12-03 DIAGNOSIS — O30049 Twin pregnancy, dichorionic/diamniotic, unspecified trimester: Secondary | ICD-10-CM

## 2015-12-03 DIAGNOSIS — Z331 Pregnant state, incidental: Secondary | ICD-10-CM

## 2015-12-03 DIAGNOSIS — Z3A08 8 weeks gestation of pregnancy: Secondary | ICD-10-CM

## 2015-12-03 LAB — POCT URINALYSIS DIPSTICK
Glucose, UA: NEGATIVE
Ketones, UA: NEGATIVE
LEUKOCYTES UA: NEGATIVE
NITRITE UA: NEGATIVE
Protein, UA: NEGATIVE
RBC UA: NEGATIVE

## 2015-12-03 MED ORDER — PREDNISONE 20 MG PO TABS: 40.0000 mg | ORAL_TABLET | Freq: Every day | ORAL | Status: DC

## 2015-12-03 MED ORDER — DOXYLAMINE-PYRIDOXINE 10-10 MG PO TBEC: DELAYED_RELEASE_TABLET | ORAL | Status: DC

## 2015-12-03 NOTE — Patient Instructions (Signed)
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
## 2015-12-03 NOTE — Progress Notes (Addendum)
US DI/DI TWINS,normal ov's bilat, Twin A lt,crl 13.658mm,pos fht 150bpm                                                         Twin B rt,crl 13.305mm,pos fht 149bpm

## 2015-12-10 ENCOUNTER — Ambulatory Visit (INDEPENDENT_AMBULATORY_CARE_PROVIDER_SITE_OTHER): Payer: Medicaid Other | Admitting: Women's Health

## 2015-12-10 ENCOUNTER — Encounter: Payer: Self-pay | Admitting: Women's Health

## 2015-12-10 ENCOUNTER — Other Ambulatory Visit (HOSPITAL_COMMUNITY)
Admission: RE | Admit: 2015-12-10 | Discharge: 2015-12-10 | Disposition: A | Discharge status: Home / Self Care | Payer: Medicaid Other | Source: Ambulatory Visit | Attending: Obstetrics & Gynecology | Admitting: Obstetrics & Gynecology

## 2015-12-10 VITALS — BP 110/70 | HR 82 | Wt 193.0 lb

## 2015-12-10 DIAGNOSIS — N760 Acute vaginitis: Secondary | ICD-10-CM | POA: Diagnosis not present

## 2015-12-10 DIAGNOSIS — Z01411 Encounter for gynecological examination (general) (routine) with abnormal findings: Secondary | ICD-10-CM | POA: Insufficient documentation

## 2015-12-10 DIAGNOSIS — A499 Bacterial infection, unspecified: Secondary | ICD-10-CM

## 2015-12-10 DIAGNOSIS — F445 Conversion disorder with seizures or convulsions: Secondary | ICD-10-CM

## 2015-12-10 DIAGNOSIS — F418 Other specified anxiety disorders: Secondary | ICD-10-CM

## 2015-12-10 DIAGNOSIS — N898 Other specified noninflammatory disorders of vagina: Secondary | ICD-10-CM | POA: Diagnosis not present

## 2015-12-10 DIAGNOSIS — O09891 Supervision of other high risk pregnancies, first trimester: Secondary | ICD-10-CM

## 2015-12-10 DIAGNOSIS — O09899 Supervision of other high risk pregnancies, unspecified trimester: Secondary | ICD-10-CM | POA: Insufficient documentation

## 2015-12-10 DIAGNOSIS — R569 Unspecified convulsions: Secondary | ICD-10-CM

## 2015-12-10 DIAGNOSIS — B9689 Other specified bacterial agents as the cause of diseases classified elsewhere: Secondary | ICD-10-CM

## 2015-12-10 DIAGNOSIS — Z113 Encounter for screening for infections with a predominantly sexual mode of transmission: Secondary | ICD-10-CM | POA: Insufficient documentation

## 2015-12-10 DIAGNOSIS — Z1389 Encounter for screening for other disorder: Secondary | ICD-10-CM

## 2015-12-10 DIAGNOSIS — Z1151 Encounter for screening for human papillomavirus (HPV): Secondary | ICD-10-CM | POA: Insufficient documentation

## 2015-12-10 DIAGNOSIS — Z369 Encounter for antenatal screening, unspecified: Secondary | ICD-10-CM

## 2015-12-10 DIAGNOSIS — O26891 Other specified pregnancy related conditions, first trimester: Secondary | ICD-10-CM | POA: Diagnosis not present

## 2015-12-10 DIAGNOSIS — F17200 Nicotine dependence, unspecified, uncomplicated: Secondary | ICD-10-CM | POA: Insufficient documentation

## 2015-12-10 DIAGNOSIS — Z331 Pregnant state, incidental: Secondary | ICD-10-CM | POA: Diagnosis not present

## 2015-12-10 DIAGNOSIS — O30049 Twin pregnancy, dichorionic/diamniotic, unspecified trimester: Secondary | ICD-10-CM | POA: Diagnosis not present

## 2015-12-10 DIAGNOSIS — Z3682 Encounter for antenatal screening for nuchal translucency: Secondary | ICD-10-CM

## 2015-12-10 HISTORY — DX: Other specified anxiety disorders: F41.8

## 2015-12-10 LAB — POCT URINALYSIS DIPSTICK
Blood, UA: NEGATIVE
GLUCOSE UA: NEGATIVE
Ketones, UA: NEGATIVE
Leukocytes, UA: NEGATIVE
NITRITE UA: NEGATIVE
Protein, UA: NEGATIVE

## 2015-12-10 LAB — POCT WET PREP (WET MOUNT): CLUE CELLS WET PREP WHIFF POC: POSITIVE

## 2015-12-10 MED ORDER — METRONIDAZOLE 500 MG PO TABS: 500.0000 mg | ORAL_TABLET | Freq: Two times a day (BID) | ORAL | Status: DC
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
## 2015-12-10 NOTE — Progress Notes (Signed)
Pt given CCNC form and lab consents to read over and sign. Pt states that she has the rash and continues taking the steroid and she only sleeps a few hours at night.

## 2015-12-10 NOTE — Addendum Note (Signed)
Addended by: Shawna Clamp R on: 12/10/2015 10:28 AM   Modules accepted: Level of Service

## 2015-12-10 NOTE — Patient Instructions (Signed)

## 2015-12-11 LAB — URINE CULTURE

## 2015-12-11 LAB — CYTOLOGY - PAP

## 2015-12-14 ENCOUNTER — Encounter: Payer: Self-pay | Admitting: Women's Health

## 2015-12-14 DIAGNOSIS — R87810 Cervical high risk human papillomavirus (HPV) DNA test positive: Secondary | ICD-10-CM

## 2015-12-14 DIAGNOSIS — R87610 Atypical squamous cells of undetermined significance on cytologic smear of cervix (ASC-US): Secondary | ICD-10-CM | POA: Insufficient documentation

## 2015-12-16 ENCOUNTER — Telehealth: Payer: Self-pay | Admitting: Women's Health

## 2015-12-16 MED ORDER — TRIAMCINOLONE ACETONIDE 0.025 % EX CREA: 1.0000 "application " | TOPICAL_CREAM | Freq: Two times a day (BID) | CUTANEOUS | Status: DC | PRN

## 2015-12-16 NOTE — Telephone Encounter (Signed)
Notified of abnormal pap w/ +HRHPV, needs colpo >14wks- won't be 14wks at next visit, so will have to be visit after.  Still itching badly- son developed rash/itchiness too- they believe it may be bed bugs as son sleeps w/ her in her bed- so getting rid of mattress today. Still not able to sleep d/t itching- requests rx for cream. Will send in low dose steroid cream. Only use small amts for short period of time.  Cheral Marker, CNM, WHNP-BC 12/16/2015 1:17 PM

## 2015-12-18 ENCOUNTER — Telehealth: Payer: Self-pay | Admitting: Women's Health

## 2015-12-18 MED ORDER — PERMETHRIN 5 % EX CREA
1.0000 | TOPICAL_CREAM | Freq: Once | CUTANEOUS | Status: DC
Start: 2015-12-18 — End: 2016-01-01

## 2015-12-18 NOTE — Telephone Encounter (Signed)
Pt called stating it was sister's mattress she was sleeping on and sister has been dx w/ scabies. Rx permethrin cream apply x 1, wash off after 8-14hr, may repeat in 1-2wks if needed. Stop using triamcinolone cream.  Cheral Marker, CNM, [facility] 12/18/2015 10:21 AM

## 2015-12-18 NOTE — Telephone Encounter (Signed)
Pt informed Permethrin cream e-scribed, directions, stop Triamcinolone cream.  Pt verbalized understanding.

## 2015-12-18 NOTE — Telephone Encounter (Signed)
c 

## 2015-12-18 NOTE — Telephone Encounter (Signed)
Pt states discussed with [name], CNM rash she has all over body, Son has the same rash. Son's MD, diagnosed with "bug bites." Pt states she called her Sister, because she had given her a used Mattress, and her Sister informed her she had been diagnosed with Scabies. Please advise.

## 2015-12-31 ENCOUNTER — Other Ambulatory Visit: Payer: Medicaid Other

## 2015-12-31 ENCOUNTER — Encounter: Payer: Medicaid Other | Admitting: Obstetrics & Gynecology

## 2016-01-01 ENCOUNTER — Ambulatory Visit (INDEPENDENT_AMBULATORY_CARE_PROVIDER_SITE_OTHER): Payer: Medicaid Other

## 2016-01-01 ENCOUNTER — Encounter: Payer: Self-pay | Admitting: Obstetrics & Gynecology

## 2016-01-01 ENCOUNTER — Other Ambulatory Visit: Payer: Self-pay | Admitting: Women's Health

## 2016-01-01 ENCOUNTER — Ambulatory Visit (INDEPENDENT_AMBULATORY_CARE_PROVIDER_SITE_OTHER): Payer: Medicaid Other | Admitting: Obstetrics & Gynecology

## 2016-01-01 VITALS — BP 110/60 | HR 76 | Wt 190.0 lb

## 2016-01-01 DIAGNOSIS — Z3A12 12 weeks gestation of pregnancy: Secondary | ICD-10-CM | POA: Diagnosis not present

## 2016-01-01 DIAGNOSIS — Z1389 Encounter for screening for other disorder: Secondary | ICD-10-CM | POA: Diagnosis not present

## 2016-01-01 DIAGNOSIS — Z3682 Encounter for antenatal screening for nuchal translucency: Secondary | ICD-10-CM

## 2016-01-01 DIAGNOSIS — O30041 Twin pregnancy, dichorionic/diamniotic, first trimester: Secondary | ICD-10-CM

## 2016-01-01 DIAGNOSIS — O30049 Twin pregnancy, dichorionic/diamniotic, unspecified trimester: Secondary | ICD-10-CM

## 2016-01-01 DIAGNOSIS — Z36 Encounter for antenatal screening of mother: Secondary | ICD-10-CM | POA: Diagnosis not present

## 2016-01-01 DIAGNOSIS — O09891 Supervision of other high risk pregnancies, first trimester: Secondary | ICD-10-CM | POA: Diagnosis not present

## 2016-01-01 DIAGNOSIS — Z331 Pregnant state, incidental: Secondary | ICD-10-CM | POA: Diagnosis not present

## 2016-01-01 LAB — POCT URINALYSIS DIPSTICK
GLUCOSE UA: NEGATIVE
KETONES UA: NEGATIVE
Leukocytes, UA: NEGATIVE
Nitrite, UA: NEGATIVE
Protein, UA: NEGATIVE
RBC UA: NEGATIVE

## 2016-01-01 NOTE — Progress Notes (Signed)
[place] 12wks,DI/DI twins, measurements c/w dates,normal ov's bilat, BABY RT post pl gr 0,NB present, NT 1.30mm,fhr 155 bpm,crl 51.24mm                                                                                                             BABY LT ant pl gr 0, NB present,NT 1 mm,fhr 155 bpm,crl 54.0 mm

## 2016-01-01 NOTE — Progress Notes (Signed)
Fetal Surveillance Testing today: sonogram normal   High Risk Pregnancy Diagnosis(es):   Mercie Eon twins  [id number] [redacted]w[redacted]d Estimated Date of Delivery: 07/15/16  Blood pressure 110/60, pulse 76, weight 190 lb (86.183 kg), last menstrual period 10/09/2015.  Urinalysis: Negative   HPI: The patient is being seen today for ongoing management of [employer] twins. Today she reports no problems   BP weight and urine results all reviewed and noted. Patient reports good fetal movement, denies any bleeding and no rupture of membranes symptoms or regular contractions.  Fundal Height:  Sonogram normal size Fetal Heart rate:  155/155 Edema:  none  Patient is without complaints other than noted in her HPI. All questions were answered.  All lab and sonogram results have been reviewed. Comments: abnormal: di di twins   Assessment:  1.  Pregnancy at [redacted]w[redacted]d,  Estimated Date of Delivery: 07/15/16 :                          2.  twins                        3.    Medication(s) Plans:    Treatment Plan:  Monthly gorwth, scans other plans per clcinicl course  No Follow-up on file. for appointment for high risk OB care  No orders of the defined types were placed in this encounter.   Orders Placed This Encounter  Procedures  . Maternal Screen, Integrated #1  . POCT urinalysis dipstick

## 2016-01-06 ENCOUNTER — Encounter (HOSPITAL_COMMUNITY): Payer: Self-pay | Admitting: Emergency Medicine

## 2016-01-06 DIAGNOSIS — R21 Rash and other nonspecific skin eruption: Secondary | ICD-10-CM | POA: Diagnosis not present

## 2016-01-06 DIAGNOSIS — F17210 Nicotine dependence, cigarettes, uncomplicated: Secondary | ICD-10-CM | POA: Diagnosis not present

## 2016-01-06 NOTE — ED Notes (Signed)
Pt. reports generalized itchy skin rashes for several weeks and facial rashes today , denies fever , respirations unlabored / airway intact with no oral swelling . Pt. is 3 months pregnant.

## 2016-01-07 ENCOUNTER — Encounter (HOSPITAL_BASED_OUTPATIENT_CLINIC_OR_DEPARTMENT_OTHER): Payer: Self-pay | Admitting: *Deleted

## 2016-01-07 ENCOUNTER — Emergency Department (HOSPITAL_BASED_OUTPATIENT_CLINIC_OR_DEPARTMENT_OTHER)
Admission: EM | Admit: 2016-01-07 | Discharge: 2016-01-07 | Disposition: A | Discharge status: Home / Self Care | Payer: Medicaid Other | Source: Home / Self Care | Attending: Emergency Medicine | Admitting: Emergency Medicine

## 2016-01-07 ENCOUNTER — Emergency Department (HOSPITAL_COMMUNITY)
Admission: EM | Admit: 2016-01-07 | Discharge: 2016-01-07 | Disposition: A | Discharge status: Home / Self Care | Payer: Medicaid Other | Attending: Emergency Medicine | Admitting: Emergency Medicine

## 2016-01-07 DIAGNOSIS — R21 Rash and other nonspecific skin eruption: Secondary | ICD-10-CM | POA: Insufficient documentation

## 2016-01-07 DIAGNOSIS — Z8669 Personal history of other diseases of the nervous system and sense organs: Secondary | ICD-10-CM | POA: Insufficient documentation

## 2016-01-07 DIAGNOSIS — K219 Gastro-esophageal reflux disease without esophagitis: Secondary | ICD-10-CM | POA: Insufficient documentation

## 2016-01-07 DIAGNOSIS — Z8781 Personal history of (healed) traumatic fracture: Secondary | ICD-10-CM | POA: Insufficient documentation

## 2016-01-07 DIAGNOSIS — Z8659 Personal history of other mental and behavioral disorders: Secondary | ICD-10-CM | POA: Insufficient documentation

## 2016-01-07 DIAGNOSIS — L539 Erythematous condition, unspecified: Secondary | ICD-10-CM | POA: Insufficient documentation

## 2016-01-07 DIAGNOSIS — Z862 Personal history of diseases of the blood and blood-forming organs and certain disorders involving the immune mechanism: Secondary | ICD-10-CM | POA: Insufficient documentation

## 2016-01-07 DIAGNOSIS — Z880 Allergy status to penicillin: Secondary | ICD-10-CM | POA: Insufficient documentation

## 2016-01-07 DIAGNOSIS — Z79899 Other long term (current) drug therapy: Secondary | ICD-10-CM | POA: Insufficient documentation

## 2016-01-07 DIAGNOSIS — F17210 Nicotine dependence, cigarettes, uncomplicated: Secondary | ICD-10-CM | POA: Insufficient documentation

## 2016-01-07 NOTE — ED Notes (Signed)
C/o fine red rash all over body x 1 month has been to MD for same.
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
## 2016-01-07 NOTE — Discharge Instructions (Signed)
You may continue using topical hydrocortisone cream to help with the rash and itching. You may also continue taking oral Benadryl. Please follow-up with your PCP/OB/GYN for further evaluation, management and medications she can use to help with your symptoms during pregnancy. Return to ED for new or worsening symptoms as we discussed.

## 2016-01-08 ENCOUNTER — Telehealth: Payer: Self-pay | Admitting: *Deleted

## 2016-01-08 LAB — MATERNAL SCREEN, INTEGRATED #1
CROWN RUMP LENGTH MAT SCREEN: 51.9 mm
CROWN RUMP LENGTH TWIN B: 54 mm
GEST. AGE ON COLLECTION DATE: 12 wk
MATERNAL AGE AT EDD: 32.6 a
NT TWIN B: 1 mm
Nuchal Translucency (NT): 1.1 mm
Number of Fetuses: 2
PAPP-A Value: 766.5 ng/mL
WEIGHT: 190 [lb_av]

## 2016-01-08 NOTE — Telephone Encounter (Signed)
[facility] states Maternal Screening #1, orders states pt had a donor egg? Per Sonographer, this pt did not have a donor egg.

## 2016-01-30 ENCOUNTER — Encounter: Payer: Self-pay | Admitting: Advanced Practice Midwife

## 2016-01-30 ENCOUNTER — Ambulatory Visit (INDEPENDENT_AMBULATORY_CARE_PROVIDER_SITE_OTHER): Payer: Medicaid Other | Admitting: Advanced Practice Midwife

## 2016-01-30 VITALS — BP 112/70 | HR 90 | Wt 187.0 lb

## 2016-01-30 DIAGNOSIS — Z331 Pregnant state, incidental: Secondary | ICD-10-CM | POA: Diagnosis not present

## 2016-01-30 DIAGNOSIS — O30049 Twin pregnancy, dichorionic/diamniotic, unspecified trimester: Secondary | ICD-10-CM

## 2016-01-30 DIAGNOSIS — Z1389 Encounter for screening for other disorder: Secondary | ICD-10-CM

## 2016-01-30 DIAGNOSIS — O09892 Supervision of other high risk pregnancies, second trimester: Secondary | ICD-10-CM | POA: Diagnosis not present

## 2016-01-30 DIAGNOSIS — Z363 Encounter for antenatal screening for malformations: Secondary | ICD-10-CM

## 2016-01-30 LAB — POCT URINALYSIS DIPSTICK
Glucose, UA: NEGATIVE
KETONES UA: NEGATIVE
LEUKOCYTES UA: NEGATIVE
Nitrite, UA: NEGATIVE
PROTEIN UA: NEGATIVE
RBC UA: NEGATIVE

## 2016-01-30 NOTE — Progress Notes (Signed)
Fetal Surveillance Testing today:  dopopler   High Risk Pregnancy Diagnosis(es):   Di/Di twons  [id number] 8333w1d Estimated Date of Delivery: 07/15/16  Blood pressure 112/70, pulse 90, weight 187 lb (84.823 kg), last menstrual period 10/09/2015.  Urinalysis: Not examined   HPI: The patient is being seen today for ongoing management of di/di twin pregnancy. Today she reports having cough/URI sx for 3 days.  Hasn't taken any meds. Throat sl sore, nose a little runny   BP weight and urine results all reviewed and noted. Patient reports good fetal movement, denies any bleeding and no rupture of membranes symptoms or regular contractions.  Fetal Heart rate:  138/145 Edema:  no  Patient is without complaints other than noted in her HPI. All questions were answered.  All lab and sonogram results have been reviewed. Comments:    Assessment:  1.  Pregnancy at 4933w1d,  Estimated Date of Delivery: 07/15/16 :                          2.  DiDi twins                        3.  Viral URI  Medication(s) Plans:  OTC meds for URI:  Will rx antibiotics next week if pt meets criteria  Treatment Plan:  Monthly growth US >20 weeks, twice weekly testing >32 weeks, deliver 38 weeks.  2nd IT today  Return in about 3 weeks (around 02/20/2016) for XB:[id number]:Anatomy TWINS/HROB. for appointment for high risk OB care  No orders of the defined types were placed in this encounter.   Orders Placed This Encounter  Procedures  . US OB Comp + 14 Wk  . POCT urinalysis dipstick

## 2016-01-30 NOTE — Addendum Note (Signed)
Addended by: Richardson ChiquitoRAVIS, ASHLEY M on: 01/30/2016 04:46 PM   Modules accepted: Orders

## 2016-01-30 NOTE — Progress Notes (Signed)
Pt states that she is having a lot of pain on both lower abdomen/pelvic area. Pt states that it is sometimes more on the left side.

## 2016-01-30 NOTE — Patient Instructions (Signed)

## 2016-01-31 ENCOUNTER — Telehealth: Payer: Self-pay | Admitting: *Deleted

## 2016-01-31 LAB — PMP SCREEN PROFILE (10S), URINE
AMPHETAMINE SCRN UR: NEGATIVE ng/mL
Barbiturate Screen, Ur: NEGATIVE ng/mL
Benzodiazepine Screen, Urine: NEGATIVE ng/mL
CREATININE(CRT), U: 151.1 mg/dL (ref 20.0–300.0)
Cannabinoids Ur Ql Scn: NEGATIVE ng/mL
Cocaine(Metab.)Screen, Urine: NEGATIVE ng/mL
METHADONE SCREEN, URINE: NEGATIVE ng/mL
OPIATE SCRN UR: NEGATIVE ng/mL
OXYCODONE+OXYMORPHONE UR QL SCN: NEGATIVE ng/mL
PCP SCRN UR: NEGATIVE ng/mL
PH UR, DRUG SCRN: 6 (ref 4.5–8.9)
PROPOXYPHENE SCREEN: NEGATIVE ng/mL

## 2016-01-31 NOTE — Telephone Encounter (Signed)
Pt c/o diarrhea, vomiting, pain in left side comes and goes, no vaginal bleeding. Pt states diarrhea and vomiting stopped this am, but the pain is still coming and going. Pt informed to push lots of water, take Tylenol if no improvement call office back. Pt verbalized understanding.

## 2016-02-01 LAB — MATERNAL SCREEN, INTEGRATED #2
ADSF: 1.55
AFP MARKER: 52.1 ng/mL
AFP MOM: 1.94
Crown Rump Length Twin B: 54 mm
Crown Rump Length: 51.9 mm
DIA MoM: 3.78
DIA Value: 585.3 pg/mL
Estriol, Unconjugated: 1.22 ng/mL
Gest. Age on Collection Date: 12 weeks
Gestational Age: 16.1 weeks
HCG MOM: 1.89
HCG VALUE: 57.1 [IU]/mL
MATERNAL AGE AT EDD: 32.6 a
NT MoM Twin B: 0.7
NT TWIN B: 1 mm
NUMBER OF FETUSES: 2
Nuchal Translucency (NT): 1.1 mm
Nuchal Translucency MoM: 0.79
PAPP-A MOM: 1.25
PAPP-A Value: 766.5 ng/mL
WEIGHT: 187 [lb_av]
Weight: 190 [lb_av]

## 2016-02-04 ENCOUNTER — Telehealth: Payer: Self-pay | Admitting: Obstetrics & Gynecology

## 2016-02-04 MED ORDER — AZITHROMYCIN 250 MG PO TABS: ORAL_TABLET | ORAL | Status: DC

## 2016-02-04 NOTE — Telephone Encounter (Signed)
Pt states that she is having a lot of congestion in her head and chest. Pt states that she is having a lot of pressure in her head. Pt states that she thinks she may have had a fever. Pt states that she has a bad cough and stuffy nose. Pt states that she has tried Robitussin, mucinex, and sudafed with no relief.

## 2016-02-04 NOTE — Telephone Encounter (Signed)
Pt aware of Rx being sent to her pharmacy, and to make sure to drink plenty of fluids. Continue using the robitussin and mucinex and wash hands. Pt verbalized understanding.

## 2016-02-18 ENCOUNTER — Other Ambulatory Visit: Payer: Self-pay | Admitting: Advanced Practice Midwife

## 2016-02-18 DIAGNOSIS — Z363 Encounter for antenatal screening for malformations: Secondary | ICD-10-CM

## 2016-02-20 ENCOUNTER — Ambulatory Visit (INDEPENDENT_AMBULATORY_CARE_PROVIDER_SITE_OTHER): Payer: Medicaid Other | Admitting: Advanced Practice Midwife

## 2016-02-20 ENCOUNTER — Encounter: Payer: Self-pay | Admitting: Advanced Practice Midwife

## 2016-02-20 ENCOUNTER — Ambulatory Visit (INDEPENDENT_AMBULATORY_CARE_PROVIDER_SITE_OTHER): Payer: Medicaid Other

## 2016-02-20 VITALS — BP 110/70 | HR 82 | Wt 189.0 lb

## 2016-02-20 DIAGNOSIS — O30042 Twin pregnancy, dichorionic/diamniotic, second trimester: Secondary | ICD-10-CM

## 2016-02-20 DIAGNOSIS — Z331 Pregnant state, incidental: Secondary | ICD-10-CM

## 2016-02-20 DIAGNOSIS — O09892 Supervision of other high risk pregnancies, second trimester: Secondary | ICD-10-CM

## 2016-02-20 DIAGNOSIS — Z3A20 20 weeks gestation of pregnancy: Secondary | ICD-10-CM

## 2016-02-20 DIAGNOSIS — Z1389 Encounter for screening for other disorder: Secondary | ICD-10-CM | POA: Diagnosis not present

## 2016-02-20 DIAGNOSIS — Z363 Encounter for antenatal screening for malformations: Secondary | ICD-10-CM

## 2016-02-20 DIAGNOSIS — O30049 Twin pregnancy, dichorionic/diamniotic, unspecified trimester: Secondary | ICD-10-CM

## 2016-02-20 LAB — POCT URINALYSIS DIPSTICK
Glucose, UA: NEGATIVE
Ketones, UA: NEGATIVE
Leukocytes, UA: NEGATIVE
Nitrite, UA: NEGATIVE
PROTEIN UA: NEGATIVE

## 2016-02-20 NOTE — Progress Notes (Signed)
Fetal Surveillance Testing today:  [name]   High Risk Pregnancy Diagnosis(es):   Di/Di twins  [id number] 7235w1d Estimated Date of Delivery: 07/15/16  Blood pressure 110/70, pulse 82, weight 189 lb (85.73 kg), last menstrual period 10/09/2015.  Urinalysis: Positive for trace blood   HPI: The patient is being seen today for ongoing management of DiDi twins. Today she reports left sided pain- sounds igament.  Try maternity belt   BP weight and urine results all reviewed and noted. Patient reports good fetal movement, denies any bleeding and no rupture of membranes symptoms or regular contractions.  Fettal Heart rate:  134/135 Edema:  no  Patient is without complaints other than noted in her HPI. All questions were answered.  All lab and sonogram results have been reviewed.   [name] 19+1 WKS, cx 4.6cm,normal ov's bilat,DI/DI TWINS:BABY A left,cephalic,ant pl gr 0,fhr 134 bpm,svp of fluid 5.3cm,need additional images of nose/lip and spine on baby A on next ultrasound,no obvious abn.seen,efw 266g,discordance 2%   BABY B rt; cephalic,post pl gr 0,fhr 135 bpm,svp of fluid 5.3 cm,anatomy complete no obvious abn seen,efw 271g,discordance 0%        Comments: normal   Assessment:  1.  Pregnancy at 7135w1d,  Estimated Date of Delivery: 07/15/16 :                          2.  Di/Di twins                        3.    Medication(s) Plans:  none  Treatment Plan:  Growth [name] monthly; twice weekly testing after 35 weeks, deliver @ 38 weeks  Return in about 2 weeks (around 03/05/2016) for HROB & colpo w/MD; schedule growth [name] for twins/HROB in 4 weeks. for appointment for high risk OB care  No orders of the defined types were placed in this encounter.   Orders Placed This Encounter  Procedures  . [name] OB Follow Up  . POCT Urinalysis Dipstick

## 2016-02-20 NOTE — Patient Instructions (Addendum)
Braxton Hicks Contractions  Contractions of the uterus can occur throughout pregnancy. Contractions are not always a sign that you are in labor.   WHAT ARE BRAXTON HICKS CONTRACTIONS?   Contractions that occur before labor are called Braxton Hicks contractions, or false labor. Toward the end of pregnancy (32-34 weeks), these contractions can develop more often and may become more forceful. This is not true labor because these contractions do not result in opening (dilatation) and thinning of the cervix. They are sometimes difficult to tell apart from true labor because these contractions can be forceful and people have different pain tolerances. You should not feel embarrassed if you go to the hospital with false labor. Sometimes, the only way to tell if you are in true labor is for your health care provider to look for changes in the cervix.  If there are no prenatal problems or other health problems associated with the pregnancy, it is completely safe to be sent home with false labor and await the onset of true labor.  HOW CAN YOU TELL THE DIFFERENCE BETWEEN TRUE AND FALSE LABOR?  False Labor  · The contractions of false labor are usually shorter and not as hard as those of true labor.    · The contractions are usually irregular.    · The contractions are often felt in the front of the lower abdomen and in the groin.    · The contractions may go away when you walk around or change positions while lying down.    · The contractions get weaker and are shorter lasting as time goes on.    · The contractions do not usually become progressively stronger, regular, and closer together as with true labor.    True Labor  · Contractions in true labor last 30-70 seconds, become very regular, usually become more intense, and increase in frequency.    · The contractions do not go away with walking.    · The discomfort is usually felt in the top of the uterus and spreads to the lower abdomen and low back.    · True labor can be  determined by your health care provider with an exam. This will show that the cervix is dilating and getting thinner.    WHAT TO REMEMBER  · Keep up with your usual exercises and follow other instructions given by your health care provider.    · Take medicines as directed by your health care provider.    · Keep your regular prenatal appointments.    · Eat and drink lightly if you think you are going into labor.    · If Braxton Hicks contractions are making you uncomfortable:      Change your position from lying down or resting to walking, or from walking to resting.      Sit and rest in a tub of warm water.      Drink 2-3 glasses of water. Dehydration may cause these contractions.      Do slow and deep breathing several times an hour.    WHEN SHOULD I SEEK IMMEDIATE MEDICAL CARE?  Seek immediate medical care if:  · Your contractions become stronger, more regular, and closer together.    · You have fluid leaking or gushing from your vagina.    · You have a fever.    · You pass blood-tinged mucus.    · You have vaginal bleeding.    · You have continuous abdominal pain.    · You have low back pain that you never had before.    · You feel your baby's head pushing down and causing pelvic pressure.    ·   Your baby is not moving as much as it used to.       This information is not intended to replace advice given to you by your health care provider. Make sure you discuss any questions you have with your health care provider.     Document Released: 11/09/2005 Document Revised: 11/14/2013 Document Reviewed: 08/21/2013  Elsevier Interactive Patient Education ©2016 [employer]  Round Ligament Pain  The round ligament is a cord of muscle and tissue that helps to support the uterus. It can become a source of pain during pregnancy if it becomes stretched or twisted as the baby grows. The pain usually begins in the second trimester of pregnancy, and it can come and go until the baby is delivered. It is not a serious problem, and  it does not cause harm to the baby.  Round ligament pain is usually a short, sharp, and pinching pain, but it can also be a dull, lingering, and aching pain. The pain is felt in the lower side of the abdomen or in the groin. It usually starts deep in the groin and moves up to the outside of the hip area. Pain can occur with:  · A sudden change in position.  · Rolling over in bed.  · Coughing or sneezing.  · Physical activity.  HOME CARE INSTRUCTIONS  Watch your condition for any changes. Take these steps to help with your pain:  · When the pain starts, relax. Then try:    Sitting down.    Flexing your knees up to your abdomen.    Lying on your side with one pillow under your abdomen and another pillow between your legs.    Sitting in a warm bath for 15-20 minutes or until the pain goes away.  · Take over-the-counter and prescription medicines only as told by your health care provider.  · Move slowly when you sit and stand.  · Avoid long walks if they cause pain.  · Stop or lessen your physical activities if they cause pain.  SEEK MEDICAL CARE IF:  · Your pain does not go away with treatment.  · You feel pain in your back that you did not have before.  · Your medicine is not helping.  SEEK IMMEDIATE MEDICAL CARE IF:  · You develop a fever or chills.  · You develop uterine contractions.  · You develop vaginal bleeding.  · You develop nausea or vomiting.  · You develop diarrhea.  · You have pain when you urinate.     This information is not intended to replace advice given to you by your health care provider. Make sure you discuss any questions you have with your health care provider.     Document Released: 08/18/2008 Document Revised: 02/01/2012 Document Reviewed: 01/16/2015  Elsevier Interactive Patient Education ©2016 [employer]

## 2016-02-20 NOTE — Progress Notes (Signed)
US 19+1 WKS, cx 4.6cm,normal ov's bilat,DI/DI TWINS:BABY A left,cephalic,ant pl gr 0,fhr 134 bpm,svp of fluid 5.3cm,need additional images of nose/lip and spine on baby A on next ultrasound,no obvious abn.seen,efw 266g,discordance 2%   BABY B rt; cephalic,post pl gr 0,fhr 135 bpm,svp of fluid 5.3 cm,anatomy complete no obvious abn seen,efw 271g,discordance 0%

## 2016-02-23 ENCOUNTER — Encounter (HOSPITAL_COMMUNITY): Payer: Self-pay | Admitting: Emergency Medicine

## 2016-02-23 ENCOUNTER — Emergency Department (HOSPITAL_COMMUNITY)
Admission: EM | Admit: 2016-02-23 | Discharge: 2016-02-23 | Discharge status: Against Medical Advice | Payer: Medicaid Other | Attending: Emergency Medicine | Admitting: Emergency Medicine

## 2016-02-23 DIAGNOSIS — O26892 Other specified pregnancy related conditions, second trimester: Secondary | ICD-10-CM | POA: Insufficient documentation

## 2016-02-23 DIAGNOSIS — G40909 Epilepsy, unspecified, not intractable, without status epilepticus: Secondary | ICD-10-CM | POA: Insufficient documentation

## 2016-02-23 DIAGNOSIS — R55 Syncope and collapse: Secondary | ICD-10-CM

## 2016-02-23 DIAGNOSIS — Z3490 Encounter for supervision of normal pregnancy, unspecified, unspecified trimester: Secondary | ICD-10-CM

## 2016-02-23 DIAGNOSIS — Z79899 Other long term (current) drug therapy: Secondary | ICD-10-CM | POA: Insufficient documentation

## 2016-02-23 DIAGNOSIS — F329 Major depressive disorder, single episode, unspecified: Secondary | ICD-10-CM | POA: Diagnosis not present

## 2016-02-23 DIAGNOSIS — F17210 Nicotine dependence, cigarettes, uncomplicated: Secondary | ICD-10-CM | POA: Diagnosis not present

## 2016-02-23 LAB — I-STAT CHEM 8, ED
BUN: <3 mg/dL — ABNORMAL LOW (ref 6–20)
CHLORIDE: 101 mmol/L (ref 101–111)
CREATININE: 0.4 mg/dL — AB (ref 0.44–1.00)
Calcium, Ion: 1.19 mmol/L (ref 1.12–1.23)
GLUCOSE: 70 mg/dL (ref 65–99)
HCT: 33 % — ABNORMAL LOW (ref 36.0–46.0)
HEMOGLOBIN: 11.2 g/dL — AB (ref 12.0–15.0)
POTASSIUM: 3.4 mmol/L — AB (ref 3.5–5.1)
Sodium: 138 mmol/L (ref 135–145)
TCO2: 23 mmol/L (ref 0–100)

## 2016-02-23 LAB — URINE MICROSCOPIC-ADD ON: RBC / HPF: NONE SEEN RBC/hpf (ref 0–5)

## 2016-02-23 LAB — URINALYSIS, ROUTINE W REFLEX MICROSCOPIC
Bilirubin Urine: NEGATIVE
GLUCOSE, UA: NEGATIVE mg/dL
Hgb urine dipstick: NEGATIVE
KETONES UR: NEGATIVE mg/dL
LEUKOCYTES UA: NEGATIVE
Nitrite: NEGATIVE
PH: 6 (ref 5.0–8.0)
Protein, ur: 30 mg/dL — AB
Specific Gravity, Urine: 1.015 (ref 1.005–1.030)

## 2016-02-23 LAB — CBG MONITORING, ED: Glucose-Capillary: 96 mg/dL (ref 65–99)

## 2016-02-23 MED ORDER — SODIUM CHLORIDE 0.9 % IV BOLUS (SEPSIS)
1000.0000 mL | Freq: Once | INTRAVENOUS | Status: AC
  Administered 2016-02-23: 1000 mL via INTRAVENOUS

## 2016-02-23 NOTE — ED Notes (Signed)
MD at bedside. 

## 2016-02-23 NOTE — ED Notes (Signed)
Pt leaving AMA. Dr. Rubin PayorPickering notified.
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
## 2016-02-23 NOTE — ED Notes (Addendum)
Pt [redacted] weeks pregnant,  reports being at work today, started to feel dizzy stating she felt like she was about to pass out, diaphoretic.  Pt reports that she did not have anything to eat prior to symptoms. Pt denies weakness unilaterally.  Pt feels better at this time but still feels dizzy/weak.

## 2016-02-24 ENCOUNTER — Ambulatory Visit (INDEPENDENT_AMBULATORY_CARE_PROVIDER_SITE_OTHER): Payer: Medicaid Other | Admitting: Obstetrics and Gynecology

## 2016-02-24 ENCOUNTER — Encounter: Payer: Self-pay | Admitting: Obstetrics and Gynecology

## 2016-02-24 VITALS — BP 110/60 | HR 90

## 2016-02-24 DIAGNOSIS — Z1389 Encounter for screening for other disorder: Secondary | ICD-10-CM | POA: Diagnosis not present

## 2016-02-24 DIAGNOSIS — Z3A20 20 weeks gestation of pregnancy: Secondary | ICD-10-CM | POA: Diagnosis not present

## 2016-02-24 DIAGNOSIS — O99282 Endocrine, nutritional and metabolic diseases complicating pregnancy, second trimester: Secondary | ICD-10-CM | POA: Diagnosis not present

## 2016-02-24 DIAGNOSIS — O09892 Supervision of other high risk pregnancies, second trimester: Secondary | ICD-10-CM | POA: Diagnosis not present

## 2016-02-24 DIAGNOSIS — O30042 Twin pregnancy, dichorionic/diamniotic, second trimester: Secondary | ICD-10-CM | POA: Diagnosis not present

## 2016-02-24 DIAGNOSIS — O99012 Anemia complicating pregnancy, second trimester: Secondary | ICD-10-CM | POA: Diagnosis not present

## 2016-02-24 DIAGNOSIS — Z331 Pregnant state, incidental: Secondary | ICD-10-CM | POA: Diagnosis not present

## 2016-02-24 LAB — POCT URINALYSIS DIPSTICK
Blood, UA: NEGATIVE
Glucose, UA: NEGATIVE
KETONES UA: NEGATIVE
LEUKOCYTES UA: NEGATIVE
Nitrite, UA: NEGATIVE
Protein, UA: NEGATIVE
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
## 2016-02-24 NOTE — Progress Notes (Signed)
Pt worked in today for follow up from ED. Pt needs to go over blood work from hospital yesterday and discuss feeling bad and almost passing out.

## 2016-03-05 ENCOUNTER — Ambulatory Visit (INDEPENDENT_AMBULATORY_CARE_PROVIDER_SITE_OTHER): Payer: Medicaid Other | Admitting: Obstetrics & Gynecology

## 2016-03-05 ENCOUNTER — Encounter: Payer: Self-pay | Admitting: Obstetrics & Gynecology

## 2016-03-05 VITALS — BP 122/60 | HR 95 | Wt 193.0 lb

## 2016-03-05 DIAGNOSIS — Z1389 Encounter for screening for other disorder: Secondary | ICD-10-CM | POA: Diagnosis not present

## 2016-03-05 DIAGNOSIS — O30049 Twin pregnancy, dichorionic/diamniotic, unspecified trimester: Secondary | ICD-10-CM

## 2016-03-05 DIAGNOSIS — R896 Abnormal cytological findings in specimens from other organs, systems and tissues: Secondary | ICD-10-CM

## 2016-03-05 DIAGNOSIS — Z331 Pregnant state, incidental: Secondary | ICD-10-CM

## 2016-03-05 DIAGNOSIS — IMO0002 Reserved for concepts with insufficient information to code with codable children: Secondary | ICD-10-CM

## 2016-03-05 DIAGNOSIS — O09892 Supervision of other high risk pregnancies, second trimester: Secondary | ICD-10-CM

## 2016-03-05 LAB — POCT URINALYSIS DIPSTICK
Blood, UA: NEGATIVE
Glucose, UA: NEGATIVE
KETONES UA: NEGATIVE
LEUKOCYTES UA: NEGATIVE
Nitrite, UA: NEGATIVE
PROTEIN UA: NEGATIVE

## 2016-03-05 NOTE — Progress Notes (Signed)
Colposcopy Procedure Note:  Colposcopy Procedure Note  Indications: Pap smear 3 months ago showed: ASCUS with POSITIVE high risk HPV. The prior pap showed atypical squamous cellularity of undetermined significance (ASCUS).  Prior cervical/vaginal disease: . Prior cervical treatment: .  Smoker:  Yes.   New sexual partner:  No.  : time frame:  No.  History of abnormal Pap: yes  Procedure Details  The risks and benefits of the procedure and Written informed consent obtained.  Speculum placed in vagina and excellent visualization of cervix achieved, cervix swabbed x 3 with acetic acid solution.  Findings: Cervix: visible lesion(s) at 6 o'clock; acetowhite changesno biopsies taken. Vaginal inspection: normal without visible lesions. Vulvar colposcopy: vulvar colposcopy not performed.  Specimens: none  Complications: none.  Plan: Repeat colpo at 6-8 weeks post delivery

## 2016-03-12 ENCOUNTER — Ambulatory Visit (INDEPENDENT_AMBULATORY_CARE_PROVIDER_SITE_OTHER): Payer: Medicaid Other | Admitting: Advanced Practice Midwife

## 2016-03-12 ENCOUNTER — Telehealth: Payer: Self-pay | Admitting: Obstetrics & Gynecology

## 2016-03-12 VITALS — BP 120/66 | HR 88 | Wt 196.0 lb

## 2016-03-12 DIAGNOSIS — Z3A23 23 weeks gestation of pregnancy: Secondary | ICD-10-CM | POA: Diagnosis not present

## 2016-03-12 DIAGNOSIS — F411 Generalized anxiety disorder: Secondary | ICD-10-CM

## 2016-03-12 DIAGNOSIS — F418 Other specified anxiety disorders: Secondary | ICD-10-CM

## 2016-03-12 DIAGNOSIS — O09892 Supervision of other high risk pregnancies, second trimester: Secondary | ICD-10-CM | POA: Diagnosis not present

## 2016-03-12 DIAGNOSIS — O30049 Twin pregnancy, dichorionic/diamniotic, unspecified trimester: Secondary | ICD-10-CM | POA: Diagnosis not present

## 2016-03-12 DIAGNOSIS — Z331 Pregnant state, incidental: Secondary | ICD-10-CM | POA: Diagnosis not present

## 2016-03-12 DIAGNOSIS — R51 Headache: Secondary | ICD-10-CM | POA: Diagnosis not present

## 2016-03-12 DIAGNOSIS — R87610 Atypical squamous cells of undetermined significance on cytologic smear of cervix (ASC-US): Secondary | ICD-10-CM

## 2016-03-12 DIAGNOSIS — O9989 Other specified diseases and conditions complicating pregnancy, childbirth and the puerperium: Secondary | ICD-10-CM | POA: Diagnosis not present

## 2016-03-12 DIAGNOSIS — Z1389 Encounter for screening for other disorder: Secondary | ICD-10-CM | POA: Diagnosis not present

## 2016-03-12 DIAGNOSIS — O99342 Other mental disorders complicating pregnancy, second trimester: Secondary | ICD-10-CM | POA: Diagnosis not present

## 2016-03-12 DIAGNOSIS — R87810 Cervical high risk human papillomavirus (HPV) DNA test positive: Secondary | ICD-10-CM

## 2016-03-12 LAB — POCT URINALYSIS DIPSTICK
Glucose, UA: NEGATIVE
Leukocytes, UA: NEGATIVE
Nitrite, UA: NEGATIVE
RBC UA: NEGATIVE

## 2016-03-12 MED ORDER — BUTALBITAL-APAP-CAFFEINE 50-325-40 MG PO TABS: 1.0000 | ORAL_TABLET | Freq: Four times a day (QID) | ORAL | Status: DC | PRN

## 2016-03-12 MED ORDER — CITALOPRAM HYDROBROMIDE 10 MG PO TABS: 10.0000 mg | ORAL_TABLET | Freq: Every day | ORAL | Status: DC

## 2016-03-12 MED ORDER — CYCLOBENZAPRINE HCL 10 MG PO TABS: 10.0000 mg | ORAL_TABLET | Freq: Three times a day (TID) | ORAL | Status: DC | PRN

## 2016-03-12 NOTE — Telephone Encounter (Signed)
Pt called stating that she would like a call back from the nurse. Pt did not state the reason why. Please contact pt °

## 2016-03-12 NOTE — Telephone Encounter (Signed)
Pt states that she has had head congestion and headaches, was told to start talking alka seltzer, medication is not helping. Pt states that she is depressed and moody. Pt was advised that she would need to be seen. Phone call was switched to front office and appointment was given.

## 2016-03-12 NOTE — Progress Notes (Signed)
Fetal Surveillance Testing today:  doppler   WORK IN:  HA (front of head and neck) for several weeks, moodiness/depression, a little increased DC after intercourse   High Risk Pregnancy Diagnosis(es):   Di/di twins  [id number] 7251w1d Estimated Date of Delivery: 07/15/16  Blood pressure 120/66, pulse 88, weight 196 lb (88.905 kg), last menstrual period 10/09/2015.  Urinalysis: Negative   HPI: The patient is being seen today for ongoing management of Di/Di twins.   BP weight and urine results all reviewed and noted. Patient reports good fetal movement, denies any bleeding and no rupture of membranes symptoms or regular contractions.  1.  Co-exam w/LHE.  Pressure points on occipital pressed for 1.5 minutes:  Frontal HA immediately resolved.  Trigger points on neck injected w/sensorcaine.  Neck pain/occipital HA better 2.  Has long hx anxiety/depression.  Had been on effexor prior to pregnancy.  Wants to start SSRI.  Discused Cat C, pt and her mom both understand and accpet potential risks. May try to stop last 3-4 weeks of pregnancy   Fetal Heart rate:  130/135 Edema:  no  Patient is without complaints other than noted in her HPI. All questions were answered.  All lab and sonogram results have been reviewed. Comments:  n/a  Assessment:  1.  Pregnancy at 3051w1d,  Estimated Date of Delivery: 07/15/16 :                          2.  Depression                        3.  Muscular HA   4. Normal vaginal DC  Medication(s) Plans:  rx flexeril and fioricet for prn use; start celexa 10mg   Return for As scheduled. for appointment for high risk OB care  Meds ordered this encounter  Medications  . cyclobenzaprine (FLEXERIL) 10 MG tablet    Sig: Take 1 tablet (10 mg total) by mouth every 8 (eight) hours as needed for muscle spasms.    Dispense:  30 tablet    Refill:  1    Order Specific Question:  Supervising Provider    Answer:  Despina HiddenEURE, LUTHER H [[id number]]  . butalbital-acetaminophen-caffeine  (FIORICET) 50-325-40 MG tablet    Sig: Take 1-2 tablets by mouth every 6 (six) hours as needed for headache.    Dispense:  20 tablet    Refill:  0    Order Specific Question:  Supervising Provider    Answer:  Despina HiddenEURE, LUTHER H [[id number]]  . citalopram (CELEXA) 10 MG tablet    Sig: Take 1 tablet (10 mg total) by mouth daily.    Dispense:  30 tablet    Refill:  2    Order Specific Question:  Supervising Provider    Answer:  Duane LopeEURE, LUTHER H [[id number]]   Orders Placed This Encounter  Procedures  . POCT urinalysis dipstick

## 2016-03-16 ENCOUNTER — Encounter (HOSPITAL_COMMUNITY): Payer: Self-pay

## 2016-03-16 ENCOUNTER — Other Ambulatory Visit: Payer: Self-pay | Admitting: Advanced Practice Midwife

## 2016-03-16 ENCOUNTER — Inpatient Hospital Stay (HOSPITAL_COMMUNITY)
Admission: AD | Admit: 2016-03-16 | Discharge: 2016-03-16 | Disposition: A | Discharge status: Home / Self Care | Payer: Medicaid Other | Source: Ambulatory Visit | Attending: Family Medicine | Admitting: Family Medicine

## 2016-03-16 DIAGNOSIS — N760 Acute vaginitis: Secondary | ICD-10-CM

## 2016-03-16 DIAGNOSIS — N898 Other specified noninflammatory disorders of vagina: Secondary | ICD-10-CM | POA: Insufficient documentation

## 2016-03-16 DIAGNOSIS — O26899 Other specified pregnancy related conditions, unspecified trimester: Secondary | ICD-10-CM

## 2016-03-16 DIAGNOSIS — O26892 Other specified pregnancy related conditions, second trimester: Secondary | ICD-10-CM | POA: Diagnosis not present

## 2016-03-16 DIAGNOSIS — Z3A22 22 weeks gestation of pregnancy: Secondary | ICD-10-CM | POA: Insufficient documentation

## 2016-03-16 DIAGNOSIS — A499 Bacterial infection, unspecified: Secondary | ICD-10-CM

## 2016-03-16 DIAGNOSIS — R102 Pelvic and perineal pain: Secondary | ICD-10-CM | POA: Diagnosis not present

## 2016-03-16 DIAGNOSIS — O09892 Supervision of other high risk pregnancies, second trimester: Secondary | ICD-10-CM

## 2016-03-16 DIAGNOSIS — O23592 Infection of other part of genital tract in pregnancy, second trimester: Secondary | ICD-10-CM

## 2016-03-16 DIAGNOSIS — O30042 Twin pregnancy, dichorionic/diamniotic, second trimester: Secondary | ICD-10-CM | POA: Insufficient documentation

## 2016-03-16 DIAGNOSIS — N949 Unspecified condition associated with female genital organs and menstrual cycle: Secondary | ICD-10-CM | POA: Diagnosis not present

## 2016-03-16 DIAGNOSIS — O9989 Other specified diseases and conditions complicating pregnancy, childbirth and the puerperium: Secondary | ICD-10-CM

## 2016-03-16 DIAGNOSIS — O30049 Twin pregnancy, dichorionic/diamniotic, unspecified trimester: Secondary | ICD-10-CM

## 2016-03-16 DIAGNOSIS — B9689 Other specified bacterial agents as the cause of diseases classified elsewhere: Secondary | ICD-10-CM

## 2016-03-16 LAB — URINE MICROSCOPIC-ADD ON: RBC / HPF: NONE SEEN RBC/hpf (ref 0–5)

## 2016-03-16 LAB — URINALYSIS, ROUTINE W REFLEX MICROSCOPIC
Bilirubin Urine: NEGATIVE
GLUCOSE, UA: NEGATIVE mg/dL
HGB URINE DIPSTICK: NEGATIVE
Ketones, ur: NEGATIVE mg/dL
Nitrite: NEGATIVE
PH: 5.5 (ref 5.0–8.0)
Protein, ur: NEGATIVE mg/dL

## 2016-03-16 LAB — WET PREP, GENITAL
SPERM: NONE SEEN
TRICH WET PREP: NONE SEEN
YEAST WET PREP: NONE SEEN

## 2016-03-16 LAB — OB RESULTS CONSOLE GC/CHLAMYDIA: GC PROBE AMP, GENITAL: NEGATIVE

## 2016-03-16 MED ORDER — METRONIDAZOLE 500 MG PO TABS: 500.0000 mg | ORAL_TABLET | Freq: Two times a day (BID) | ORAL | Status: DC

## 2016-03-16 NOTE — MAU Note (Signed)
Pt c/o yellow mucousy discharge last night and contractions that started around 130pm today. States she feels them every 30 mins and last for about 30 seconds. Denies bleeding.
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
## 2016-03-16 NOTE — Discharge Instructions (Signed)
Preterm Labor Information Preterm labor is when labor starts at less than 37 weeks of pregnancy. The normal length of a pregnancy is 39 to 41 weeks. CAUSES Often, there is no identifiable underlying cause as to why a woman goes into preterm labor. One of the most common known causes of preterm labor is infection. Infections of the uterus, cervix, vagina, amniotic sac, bladder, kidney, or even the lungs (pneumonia) can cause labor to start. Other suspected causes of preterm labor include:   Urogenital infections, such as yeast infections and bacterial vaginosis.   Uterine abnormalities (uterine shape, uterine septum, fibroids, or bleeding from the placenta).   A cervix that has been operated on (it may fail to stay closed).   Malformations in the fetus.   Multiple gestations (twins, triplets, and so on).   Breakage of the amniotic sac.  RISK FACTORS  Having a previous history of preterm labor.   Having premature rupture of membranes (PROM).   Having a placenta that covers the opening of the cervix (placenta previa).   Having a placenta that separates from the uterus (placental abruption).   Having a cervix that is too weak to hold the fetus in the uterus (incompetent cervix).   Having too much fluid in the amniotic sac (polyhydramnios).   Taking illegal drugs or smoking while pregnant.   Not gaining enough weight while pregnant.   Being younger than 6918 and older than 10244 years old.   Having a low socioeconomic status.   Being African American. SYMPTOMS Signs and symptoms of preterm labor include:   Menstrual-like cramps, abdominal pain, or back pain.  Uterine contractions that are regular, as frequent as six in an hour, regardless of their intensity (may be mild or painful).  Contractions that start on the top of the uterus and spread down to the lower abdomen and back.   A sense of increased pelvic pressure.   A watery or bloody mucus discharge that  comes from the vagina.  TREATMENT Depending on the length of the pregnancy and other circumstances, your health care provider may suggest bed rest. If necessary, there are medicines that can be given to stop contractions and to mature the fetal lungs. If labor happens before 34 weeks of pregnancy, a prolonged hospital stay may be recommended. Treatment depends on the condition of both you and the fetus.  WHAT SHOULD YOU DO IF YOU THINK YOU ARE IN PRETERM LABOR? Call your health care provider right away. You will need to go to the hospital to get checked immediately. HOW CAN YOU PREVENT PRETERM LABOR IN FUTURE PREGNANCIES? You should:   Stop smoking if you smoke.  Maintain healthy weight gain and avoid chemicals and drugs that are not necessary.  Be watchful for any type of infection.  Inform your health care provider if you have a known history of preterm labor.   This information is not intended to replace advice given to you by your health care provider. Make sure you discuss any questions you have with your health care provider.   Document Released: 01/30/2004 Document Revised: 07/12/2013 Document Reviewed: 12/12/2012 Elsevier Interactive Patient Education 2016 Elsevier Inc.   Round Ligament Pain The round ligament is a cord of muscle and tissue that helps to support the uterus. It can become a source of pain during pregnancy if it becomes stretched or twisted as the baby grows. The pain usually begins in the second trimester of pregnancy, and it can come and go until the baby is  delivered. It is not a serious problem, and it does not cause harm to the baby. Round ligament pain is usually a short, sharp, and pinching pain, but it can also be a dull, lingering, and aching pain. The pain is felt in the lower side of the abdomen or in the groin. It usually starts deep in the groin and moves up to the outside of the hip area. Pain can occur with:  A sudden change in position.  Rolling  over in bed.  Coughing or sneezing.  Physical activity. HOME CARE INSTRUCTIONS Watch your condition for any changes. Take these steps to help with your pain:  When the pain starts, relax. Then try:  Sitting down.  Flexing your knees up to your abdomen.  Lying on your side with one pillow under your abdomen and another pillow between your legs.  Sitting in a warm bath for 15-20 minutes or until the pain goes away.  Take over-the-counter and prescription medicines only as told by your health care provider.  Move slowly when you sit and stand.  Avoid long walks if they cause pain.  Stop or lessen your physical activities if they cause pain. SEEK MEDICAL CARE IF:  Your pain does not go away with treatment.  You feel pain in your back that you did not have before.  Your medicine is not helping. SEEK IMMEDIATE MEDICAL CARE IF:  You develop a fever or chills.  You develop uterine contractions.  You develop vaginal bleeding.  You develop nausea or vomiting.  You develop diarrhea.  You have pain when you urinate.   This information is not intended to replace advice given to you by your health care provider. Make sure you discuss any questions you have with your health care provider.   Document Released: 08/18/2008 Document Revised: 02/01/2012 Document Reviewed: 01/16/2015 Elsevier Interactive Patient Education [employer]2016 Elsevier Inc.

## 2016-03-17 LAB — RPR: RPR: NONREACTIVE

## 2016-03-17 LAB — GC/CHLAMYDIA PROBE AMP (~~LOC~~) NOT AT ARMC
Chlamydia: NEGATIVE
NEISSERIA GONORRHEA: NEGATIVE

## 2016-03-19 ENCOUNTER — Other Ambulatory Visit: Payer: Medicaid Other

## 2016-03-23 ENCOUNTER — Ambulatory Visit (INDEPENDENT_AMBULATORY_CARE_PROVIDER_SITE_OTHER): Payer: Medicaid Other

## 2016-03-23 DIAGNOSIS — Z3A24 24 weeks gestation of pregnancy: Secondary | ICD-10-CM | POA: Diagnosis not present

## 2016-03-23 DIAGNOSIS — O09892 Supervision of other high risk pregnancies, second trimester: Secondary | ICD-10-CM

## 2016-03-23 DIAGNOSIS — O30049 Twin pregnancy, dichorionic/diamniotic, unspecified trimester: Secondary | ICD-10-CM

## 2016-03-23 DIAGNOSIS — O30042 Twin pregnancy, dichorionic/diamniotic, second trimester: Secondary | ICD-10-CM

## 2016-03-23 NOTE — Progress Notes (Signed)
US 23+5 WKS,DI/DI TWINS: CX 4.1cm,bilat adnexa's wnl, BABY A LT ant pl gr 0,cephalic,spine and nose/lip anatomy complete,no obvious abn seen,fhr 140 bpm,svp of fluid 6.4cm,efw 664 g 52 %,   BABY B RT: post pl gr 0,cephalic,post pl gr 0,efw 682 g 55%,svp of fluid 6.2 cm,fhr 126 bpm

## 2016-03-25 ENCOUNTER — Ambulatory Visit (INDEPENDENT_AMBULATORY_CARE_PROVIDER_SITE_OTHER): Payer: Medicaid Other | Admitting: Obstetrics & Gynecology

## 2016-03-25 ENCOUNTER — Encounter: Payer: Self-pay | Admitting: Obstetrics & Gynecology

## 2016-03-25 VITALS — BP 120/60 | HR 80 | Wt 197.0 lb

## 2016-03-25 DIAGNOSIS — O09892 Supervision of other high risk pregnancies, second trimester: Secondary | ICD-10-CM

## 2016-03-25 DIAGNOSIS — O30049 Twin pregnancy, dichorionic/diamniotic, unspecified trimester: Secondary | ICD-10-CM | POA: Diagnosis not present

## 2016-03-25 DIAGNOSIS — Z1389 Encounter for screening for other disorder: Secondary | ICD-10-CM | POA: Diagnosis not present

## 2016-03-25 DIAGNOSIS — Z331 Pregnant state, incidental: Secondary | ICD-10-CM

## 2016-03-25 LAB — POCT URINALYSIS DIPSTICK
Blood, UA: NEGATIVE
Glucose, UA: NEGATIVE
KETONES UA: NEGATIVE
Leukocytes, UA: NEGATIVE
Nitrite, UA: NEGATIVE
PROTEIN UA: NEGATIVE

## 2016-03-25 MED ORDER — OMEPRAZOLE 20 MG PO CPDR: 20.0000 mg | DELAYED_RELEASE_CAPSULE | Freq: Every day | ORAL | Status: DC

## 2016-03-25 NOTE — Progress Notes (Signed)
Fetal Surveillance Testing today:  DiDi twins   High Risk Pregnancy Diagnosis(es):   FHR  [id number] 3032w0d Estimated Date of Delivery: 07/15/16  Blood pressure 120/60, pulse 80, weight 197 lb (89.359 kg), last menstrual period 10/09/2015.  Urinalysis: Negative   HPI: The patient is being seen today for ongoing management of [name] di twins. Today she reports right back pain, right sciatica   BP weight and urine results all reviewed and noted. Patient reports good fetal movement, denies any bleeding and no rupture of membranes symptoms or regular contractions.  Fundal Height:  32 Fetal Heart rate:  138/135 Edema:  none  Patient is without complaints other than noted in her HPI. All questions were answered.  All lab and sonogram results have been reviewed. Comments: abnormal: twins   Assessment:  1.  Pregnancy at 6532w0d,  Estimated Date of Delivery: 07/15/16 :                          2.  Twins, didi                        3.  Right sciatica  Medication(s) Plans:  none  Treatment Plan:  Growth scans at 28 weeks  No Follow-up on file. for appointment for high risk OB care  No orders of the defined types were placed in this encounter.   Orders Placed This Encounter  Procedures  . POCT urinalysis dipstick

## 2016-03-25 NOTE — Addendum Note (Signed)
Addended by: Lazaro ArmsEURE, Mackson Botz H on: 03/25/2016 03:08 PM   Modules accepted: Orders

## 2016-03-26 ENCOUNTER — Other Ambulatory Visit: Payer: Self-pay | Admitting: Obstetrics & Gynecology

## 2016-03-26 ENCOUNTER — Telehealth: Payer: Self-pay | Admitting: *Deleted

## 2016-03-26 MED ORDER — HYDROCODONE-ACETAMINOPHEN 5-325 MG PO TABS: 1.0000 | ORAL_TABLET | Freq: Four times a day (QID) | ORAL | Status: DC | PRN

## 2016-03-26 NOTE — Telephone Encounter (Signed)
Pt informed per Dr. Despina HiddenEure will prescribed pain meds for sciatic nerve pain, needs to use sparely. Dr. Despina HiddenEure doesn't like to prescribe pain meds during pregnancy.Pt also will need to stop work due to pain per Dr. Despina HiddenEure. Pt verbalized understanding and work note and RX left at front desk for pick up.

## 2016-03-26 NOTE — Telephone Encounter (Signed)
Pt states saw Dr. Despina HiddenEure yesterday was given an injections lower back for sciatic nerve pain. Pt states she continues the sciatic pain, HA, nausea, lightheadedness, and dizziness. Pt states "feels like crap."

## 2016-03-27 NOTE — Telephone Encounter (Signed)
Per out conversation and pt given a few hydrocodone

## 2016-03-31 ENCOUNTER — Telehealth: Payer: Self-pay | Admitting: Obstetrics & Gynecology

## 2016-03-31 NOTE — Telephone Encounter (Signed)
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 Please advise.

## 2016-03-31 NOTE — Telephone Encounter (Signed)
Work note stating pt restrictions left at front desk for pick up.

## 2016-03-31 NOTE — Telephone Encounter (Signed)
Just driving assuming no other issues come up, then for now yes

## 2016-03-31 NOTE — Telephone Encounter (Signed)
Just ask her tomorrow and give what she needs, just can't lift more than 20pounds

## 2016-04-08 ENCOUNTER — Telehealth: Payer: Self-pay | Admitting: Women's Health

## 2016-04-08 ENCOUNTER — Encounter: Payer: Self-pay | Admitting: Obstetrics and Gynecology

## 2016-04-08 ENCOUNTER — Encounter: Payer: Medicaid Other | Admitting: Obstetrics & Gynecology

## 2016-04-08 NOTE — Telephone Encounter (Signed)
Pt states had a episode of coughing and had a little leakage last night, now continues to notice wetness in her underwear, +FM, no vaginal bleeding. Pt given an appt today for evaluation.

## 2016-04-09 ENCOUNTER — Ambulatory Visit (INDEPENDENT_AMBULATORY_CARE_PROVIDER_SITE_OTHER): Payer: Medicaid Other | Admitting: Obstetrics & Gynecology

## 2016-04-09 VITALS — BP 120/60 | HR 78 | Wt 201.0 lb

## 2016-04-09 DIAGNOSIS — B373 Candidiasis of vulva and vagina: Secondary | ICD-10-CM | POA: Diagnosis not present

## 2016-04-09 DIAGNOSIS — Z1389 Encounter for screening for other disorder: Secondary | ICD-10-CM

## 2016-04-09 DIAGNOSIS — Z331 Pregnant state, incidental: Secondary | ICD-10-CM | POA: Diagnosis not present

## 2016-04-09 DIAGNOSIS — O09892 Supervision of other high risk pregnancies, second trimester: Secondary | ICD-10-CM | POA: Diagnosis not present

## 2016-04-09 DIAGNOSIS — IMO0001 Reserved for inherently not codable concepts without codable children: Secondary | ICD-10-CM

## 2016-04-09 DIAGNOSIS — B3731 Acute candidiasis of vulva and vagina: Secondary | ICD-10-CM

## 2016-04-09 LAB — POCT URINALYSIS DIPSTICK
Blood, UA: NEGATIVE
GLUCOSE UA: NEGATIVE
KETONES UA: NEGATIVE
LEUKOCYTES UA: NEGATIVE
Nitrite, UA: NEGATIVE

## 2016-04-09 MED ORDER — TERCONAZOLE 0.4 % VA CREA
1.0000 | TOPICAL_CREAM | Freq: Every day | VAGINAL | Status: DC
Start: 2016-04-09 — End: 2016-04-30

## 2016-04-09 NOTE — Progress Notes (Signed)
Fetal Surveillance Testing today:  Fetal heart rate 2 normal   High Risk Pregnancy Diagnosis(es):   Died I twins  [id number] 1843w1d Estimated Date of Delivery: 07/15/16  Blood pressure 120/60, pulse 78, weight 201 lb (91.173 kg), last menstrual period 10/09/2015.  Urinalysis: Negative   HPI: The patient is being seen today for ongoing management of that I twins. Today she reports concern regarding some fluid per vagina no itching discharge or odor   BP weight and urine results all reviewed and noted. Patient reports good fetal movement, denies any bleeding and no rupture of membranes symptoms or regular contractions.  Fundal Height:  34 cm Fetal Heart rate:  140 and 145 Edema:  None Speculum exam reveals no amniotic fluid just some cervical mucus and yeast no other findings  Patient is without complaints other than noted in her HPI. All questions were answered.  All lab and sonogram results have been reviewed. Comments: normal   Assessment:  1.  Pregnancy at 8243w1d,  Estimated Date of Delivery: 07/15/16 :                          2.  DiDi twins                        3.  Yeast vaginitis  Medication(s) Plans: Terazol 7  Treatment Plan:  Per protocol  Return for Sonogram twins follow up, PN2,, HROB. for appointment for high risk OB care  Meds ordered this encounter  Medications  . terconazole (TERAZOL 7) 0.4 % vaginal cream    Sig: Place 1 applicator vaginally at bedtime.    Dispense:  45 g    Refill:  0   Orders Placed This Encounter  Procedures  . US OB Follow Up  . US OB Follow Up AddL Gest  . POCT urinalysis dipstick

## 2016-04-17 ENCOUNTER — Encounter (HOSPITAL_COMMUNITY): Payer: Self-pay

## 2016-04-17 ENCOUNTER — Inpatient Hospital Stay (HOSPITAL_COMMUNITY)
Admission: AD | Admit: 2016-04-17 | Discharge: 2016-04-17 | Disposition: A | Discharge status: Home / Self Care | Payer: Medicaid Other | Source: Ambulatory Visit | Attending: Family Medicine | Admitting: Family Medicine

## 2016-04-17 DIAGNOSIS — Z880 Allergy status to penicillin: Secondary | ICD-10-CM | POA: Insufficient documentation

## 2016-04-17 DIAGNOSIS — O4702 False labor before 37 completed weeks of gestation, second trimester: Secondary | ICD-10-CM

## 2016-04-17 DIAGNOSIS — F17210 Nicotine dependence, cigarettes, uncomplicated: Secondary | ICD-10-CM | POA: Insufficient documentation

## 2016-04-17 DIAGNOSIS — R109 Unspecified abdominal pain: Secondary | ICD-10-CM | POA: Diagnosis present

## 2016-04-17 DIAGNOSIS — O99332 Smoking (tobacco) complicating pregnancy, second trimester: Secondary | ICD-10-CM | POA: Diagnosis not present

## 2016-04-17 DIAGNOSIS — O30042 Twin pregnancy, dichorionic/diamniotic, second trimester: Secondary | ICD-10-CM | POA: Diagnosis not present

## 2016-04-17 DIAGNOSIS — Z3A27 27 weeks gestation of pregnancy: Secondary | ICD-10-CM | POA: Diagnosis not present

## 2016-04-17 DIAGNOSIS — O26892 Other specified pregnancy related conditions, second trimester: Secondary | ICD-10-CM | POA: Insufficient documentation

## 2016-04-17 DIAGNOSIS — O479 False labor, unspecified: Secondary | ICD-10-CM

## 2016-04-17 LAB — URINALYSIS, ROUTINE W REFLEX MICROSCOPIC
Bilirubin Urine: NEGATIVE
GLUCOSE, UA: NEGATIVE mg/dL
Hgb urine dipstick: NEGATIVE
KETONES UR: NEGATIVE mg/dL
LEUKOCYTES UA: NEGATIVE
NITRITE: NEGATIVE
PROTEIN: NEGATIVE mg/dL
Specific Gravity, Urine: 1.01 (ref 1.005–1.030)
pH: 5.5 (ref 5.0–8.0)
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
## 2016-04-17 NOTE — Discharge Instructions (Signed)
Braxton Hicks Contractions °Contractions of the uterus can occur throughout pregnancy. Contractions are not always a sign that you are in labor.  °WHAT ARE BRAXTON HICKS CONTRACTIONS?  °Contractions that occur before labor are called Braxton Hicks contractions, or false labor. Toward the end of pregnancy (32-34 weeks), these contractions can develop more often and may become more forceful. This is not true labor because these contractions do not result in opening (dilatation) and thinning of the cervix. They are sometimes difficult to tell apart from true labor because these contractions can be forceful and people have different pain tolerances. You should not feel embarrassed if you go to the hospital with false labor. Sometimes, the only way to tell if you are in true labor is for your health care provider to look for changes in the cervix. °If there are no prenatal problems or other health problems associated with the pregnancy, it is completely safe to be sent home with false labor and await the onset of true labor. °HOW CAN YOU TELL THE DIFFERENCE BETWEEN TRUE AND FALSE LABOR? °False Labor °· The contractions of false labor are usually shorter and not as hard as those of true labor.   °· The contractions are usually irregular.   °· The contractions are often felt in the front of the lower abdomen and in the groin.   °· The contractions may go away when you walk around or change positions while lying down.   °· The contractions get weaker and are shorter lasting as time goes on.   °· The contractions do not usually become progressively stronger, regular, and closer together as with true labor.   °True Labor °· Contractions in true labor last 30-70 seconds, become very regular, usually become more intense, and increase in frequency.   °· The contractions do not go away with walking.   °· The discomfort is usually felt in the top of the uterus and spreads to the lower abdomen and low back.   °· True labor can be  determined by your health care provider with an exam. This will show that the cervix is dilating and getting thinner.   °WHAT TO REMEMBER °· Keep up with your usual exercises and follow other instructions given by your health care provider.   °· Take medicines as directed by your health care provider.   °· Keep your regular prenatal appointments.   °· Eat and drink lightly if you think you are going into labor.   °· If Braxton Hicks contractions are making you uncomfortable:   °¨ Change your position from lying down or resting to walking, or from walking to resting.   °¨ Sit and rest in a tub of warm water.   °¨ Drink 2-3 glasses of water. Dehydration may cause these contractions.   °¨ Do slow and deep breathing several times an hour.   °WHEN SHOULD I SEEK IMMEDIATE MEDICAL CARE? °Seek immediate medical care if: °· Your contractions become stronger, more regular, and closer together.   °· You have fluid leaking or gushing from your vagina.   °· You have a fever.   °· You pass blood-tinged mucus.   °· You have vaginal bleeding.   °· You have continuous abdominal pain.   °· You have low back pain that you never had before.   °· You feel your baby's head pushing down and causing pelvic pressure.   °· Your baby is not moving as much as it used to.   °  °This information is not intended to replace advice given to you by your health care provider. Make sure you discuss any questions you have with your health care   provider. °  °Document Released: 11/09/2005 Document Revised: 11/14/2013 Document Reviewed: 08/21/2013 °Elsevier Interactive Patient Education ©2016 [employer] ° °

## 2016-04-17 NOTE — MAU Note (Signed)
Twin boys. Having tightness off and on since Weds. Today started hurting in lower abd on sides and into R lower back today. HEadaches that come and go. Some nausea. Denies LOF or bleeding

## 2016-04-22 ENCOUNTER — Ambulatory Visit (INDEPENDENT_AMBULATORY_CARE_PROVIDER_SITE_OTHER): Payer: Medicaid Other | Admitting: Obstetrics and Gynecology

## 2016-04-22 ENCOUNTER — Ambulatory Visit (INDEPENDENT_AMBULATORY_CARE_PROVIDER_SITE_OTHER): Payer: Medicaid Other

## 2016-04-22 ENCOUNTER — Other Ambulatory Visit: Payer: Self-pay | Admitting: Obstetrics and Gynecology

## 2016-04-22 ENCOUNTER — Other Ambulatory Visit: Payer: Medicaid Other

## 2016-04-22 ENCOUNTER — Encounter: Payer: Self-pay | Admitting: Obstetrics and Gynecology

## 2016-04-22 ENCOUNTER — Other Ambulatory Visit: Payer: Self-pay | Admitting: Obstetrics & Gynecology

## 2016-04-22 VITALS — BP 110/70 | HR 94 | Wt 202.0 lb

## 2016-04-22 DIAGNOSIS — Z131 Encounter for screening for diabetes mellitus: Secondary | ICD-10-CM

## 2016-04-22 DIAGNOSIS — Z3A28 28 weeks gestation of pregnancy: Secondary | ICD-10-CM

## 2016-04-22 DIAGNOSIS — Z3689 Encounter for other specified antenatal screening: Secondary | ICD-10-CM

## 2016-04-22 DIAGNOSIS — Z1389 Encounter for screening for other disorder: Secondary | ICD-10-CM | POA: Diagnosis not present

## 2016-04-22 DIAGNOSIS — O99343 Other mental disorders complicating pregnancy, third trimester: Secondary | ICD-10-CM | POA: Diagnosis not present

## 2016-04-22 DIAGNOSIS — O30043 Twin pregnancy, dichorionic/diamniotic, third trimester: Secondary | ICD-10-CM

## 2016-04-22 DIAGNOSIS — O30049 Twin pregnancy, dichorionic/diamniotic, unspecified trimester: Secondary | ICD-10-CM

## 2016-04-22 DIAGNOSIS — Z369 Encounter for antenatal screening, unspecified: Secondary | ICD-10-CM

## 2016-04-22 DIAGNOSIS — O1213 Gestational proteinuria, third trimester: Secondary | ICD-10-CM

## 2016-04-22 DIAGNOSIS — O09893 Supervision of other high risk pregnancies, third trimester: Secondary | ICD-10-CM | POA: Diagnosis not present

## 2016-04-22 DIAGNOSIS — IMO0001 Reserved for inherently not codable concepts without codable children: Secondary | ICD-10-CM

## 2016-04-22 DIAGNOSIS — Z331 Pregnant state, incidental: Secondary | ICD-10-CM

## 2016-04-22 LAB — POCT URINALYSIS DIPSTICK
GLUCOSE UA: NEGATIVE
Ketones, UA: NEGATIVE
Leukocytes, UA: NEGATIVE
Nitrite, UA: NEGATIVE
Protein, UA: 2
RBC UA: NEGATIVE

## 2016-04-22 NOTE — Progress Notes (Signed)
US 28 wks,DI/DI twins,cx 3.4cm,bilat adnexa's wnl, BABY A: cephalic left,ant pl gr 0,fhr 161139 bpm,svp of fluid 5.9 cm,EFW 1260 62%,discordance 3%                                                                                     BABY B: breech right,post pl gr 0,fhr 133 bpm,svp of fluid 5.4 cm,EFW 1305 G 66%,pt didn't feel well during exam

## 2016-04-22 NOTE — Progress Notes (Signed)
Pt denies any problems or concerns at this time.  

## 2016-04-22 NOTE — Addendum Note (Signed)
Addended by: Richardson ChiquitoRAVIS, ASHLEY M on: 04/22/2016 12:27 PM   Modules accepted: Orders

## 2016-04-22 NOTE — Progress Notes (Signed)
Patient ID: Paula RinneHeather M [name], female   DOB: 11/19/1986, 30 y.o.   MRN: [id number]   High Risk Pregnancy Diagnosis(es):   DiDi twins  [id number] Estimated Date of Delivery: 07/15/16    HPI: The patient is being seen today for ongoing management of DiDi twins .  Today she reports she is generally fatigued with her pregnancy. She also complains of occasional acid reflux, for which she is taking Prilosec with some relief.   She reports she wishes to proceed with tubal ligation after delivery. Pt reports she is currently working at [employer] 8hrs/day, 40 hours/wk and desires to keep working at this time.   Patient reports good fetal movement, denies any bleeding and no rupture of membranes symptoms or regular contractions.   BP weight and urine results reviewed and noted. Blood pressure 110/70, pulse 94, weight 202 lb (91.627 kg), last menstrual period 10/09/2015.  Fetal Surveillance Testing today:  PN2, US OB f/u  Fundal Height:  32 cm Fetal Heart rate:  Baby A 139 bpm; Baby B 133 bpm  Edema:  none Urinalysis: Positive for +2 protein    Questions were answered.  Lab and sonogram results have been reviewed. Comments: Baby A vertex, Baby B breech; PN2 results pending  Assessment:  1.  Pregnancy at 3869w0d,  Estimated Date of Delivery: 07/15/16 :  [id number]                         2.  DiDi twins symmetric growth                        3. C/o GERD, improved with Prilosec                         4 desire to continue working. Will reduce to 30 hr /wk limit                        5 proteinuria, will send u/a, Pr/Cr ratio, culture. Medication(s) Plans:  Continue Prilosec prn, consider sleep agent during last month of pregnancy if needed  Treatment Plan:  1. Send urine culture 2. F/u US q4wks 3. Pt requests to continue working; pt to work no more than 8hrs/day, 30hrs/week  4 Follow up in 2 weeks for appointment for high risk OB care    By signing my name below, I, Doreatha MartinEva Mathews, attest  that this documentation has been prepared under the direction and in the presence of Tilda BurrowJohn Fraidy Mccarrick V, MD. Electronically Signed: Doreatha MartinEva Mathews, ED Scribe. 04/22/2016. 10:48 AM.  I personally performed the services described in this documentation, which was SCRIBED in my presence. The recorded information has been reviewed and considered accurate. It has been edited as necessary during review. Tilda BurrowFERGUSON,Conal Shetley V, MD

## 2016-04-23 ENCOUNTER — Other Ambulatory Visit: Payer: Medicaid Other

## 2016-04-23 ENCOUNTER — Telehealth: Payer: Self-pay | Admitting: Cardiology

## 2016-04-23 DIAGNOSIS — R809 Proteinuria, unspecified: Secondary | ICD-10-CM

## 2016-04-23 LAB — URINALYSIS, ROUTINE W REFLEX MICROSCOPIC
Bilirubin, UA: NEGATIVE
Glucose, UA: NEGATIVE
KETONES UA: NEGATIVE
Leukocytes, UA: NEGATIVE
NITRITE UA: NEGATIVE
RBC, UA: NEGATIVE
Specific Gravity, UA: 1.013 (ref 1.005–1.030)
Urobilinogen, Ur: 0.2 mg/dL (ref 0.2–1.0)
pH, UA: 6.5 (ref 5.0–7.5)

## 2016-04-23 LAB — RPR: RPR Ser Ql: NONREACTIVE

## 2016-04-23 LAB — CBC
HEMOGLOBIN: 11 g/dL — AB (ref 11.1–15.9)
Hematocrit: 33.1 % — ABNORMAL LOW (ref 34.0–46.6)
MCH: 30.4 pg (ref 26.6–33.0)
MCHC: 33.2 g/dL (ref 31.5–35.7)
MCV: 91 fL (ref 79–97)
PLATELETS: 208 10*3/uL (ref 150–379)
RBC: 3.62 x10E6/uL — AB (ref 3.77–5.28)
RDW: 14.6 % (ref 12.3–15.4)
WBC: 20.2 10*3/uL (ref 3.4–10.8)

## 2016-04-23 LAB — GLUCOSE TOLERANCE, 2 HOURS W/ 1HR
GLUCOSE, 2 HOUR: 120 mg/dL (ref 65–152)
Glucose, 1 hour: 87 mg/dL (ref 65–179)
Glucose, Fasting: 72 mg/dL (ref 65–91)

## 2016-04-23 LAB — ANTIBODY SCREEN: Antibody Screen: NEGATIVE

## 2016-04-23 LAB — HIV ANTIBODY (ROUTINE TESTING W REFLEX): HIV Screen 4th Generation wRfx: NONREACTIVE

## 2016-04-23 NOTE — Telephone Encounter (Signed)
Pt states that she has a pain in her head in a straight line that radiates to her eye. Pt states that she is not longer taking the headache medication that she was given before. Pt states that she has had this pain a few times and wanted Dr. Emelda FearFerguson to know.   I advised the pt that I would discuss it with Dr. Emelda FearFerguson and let her know what he says. Pt verbalized understanding.

## 2016-04-24 LAB — URINE CULTURE

## 2016-04-24 LAB — PROTEIN / CREATININE RATIO, URINE
Creatinine, Urine: 108.9 mg/dL
PROTEIN UR: 17.7 mg/dL
PROTEIN/CREAT RATIO: 163 mg/g{creat} (ref 0–200)

## 2016-04-24 NOTE — Telephone Encounter (Signed)
Protein creatinine ratio normal, and submitted urinalysis only trace protein

## 2016-04-27 ENCOUNTER — Telehealth: Payer: Self-pay | Admitting: Obstetrics and Gynecology

## 2016-04-27 ENCOUNTER — Encounter (HOSPITAL_COMMUNITY): Payer: Self-pay | Admitting: *Deleted

## 2016-04-27 ENCOUNTER — Telehealth: Payer: Self-pay | Admitting: *Deleted

## 2016-04-27 ENCOUNTER — Inpatient Hospital Stay (HOSPITAL_COMMUNITY)
Admission: AD | Admit: 2016-04-27 | Discharge: 2016-04-27 | Disposition: A | Discharge status: Home / Self Care | Payer: Medicaid Other | Source: Ambulatory Visit | Attending: Obstetrics & Gynecology | Admitting: Obstetrics & Gynecology

## 2016-04-27 DIAGNOSIS — O99333 Smoking (tobacco) complicating pregnancy, third trimester: Secondary | ICD-10-CM | POA: Diagnosis not present

## 2016-04-27 DIAGNOSIS — O26893 Other specified pregnancy related conditions, third trimester: Secondary | ICD-10-CM | POA: Insufficient documentation

## 2016-04-27 DIAGNOSIS — Z880 Allergy status to penicillin: Secondary | ICD-10-CM | POA: Diagnosis not present

## 2016-04-27 DIAGNOSIS — F17210 Nicotine dependence, cigarettes, uncomplicated: Secondary | ICD-10-CM | POA: Diagnosis not present

## 2016-04-27 DIAGNOSIS — O30043 Twin pregnancy, dichorionic/diamniotic, third trimester: Secondary | ICD-10-CM | POA: Diagnosis not present

## 2016-04-27 DIAGNOSIS — M545 Low back pain, unspecified: Secondary | ICD-10-CM

## 2016-04-27 DIAGNOSIS — Z3A28 28 weeks gestation of pregnancy: Secondary | ICD-10-CM | POA: Insufficient documentation

## 2016-04-27 DIAGNOSIS — O4703 False labor before 37 completed weeks of gestation, third trimester: Secondary | ICD-10-CM | POA: Diagnosis not present

## 2016-04-27 LAB — URINE MICROSCOPIC-ADD ON: RBC / HPF: NONE SEEN RBC/hpf (ref 0–5)

## 2016-04-27 LAB — URINALYSIS, ROUTINE W REFLEX MICROSCOPIC
BILIRUBIN URINE: NEGATIVE
GLUCOSE, UA: NEGATIVE mg/dL
HGB URINE DIPSTICK: NEGATIVE
Ketones, ur: NEGATIVE mg/dL
Nitrite: NEGATIVE
PH: 6.5 (ref 5.0–8.0)
Protein, ur: NEGATIVE mg/dL
SPECIFIC GRAVITY, URINE: 1.01 (ref 1.005–1.030)

## 2016-04-27 MED ORDER — CYCLOBENZAPRINE HCL 10 MG PO TABS: 10.0000 mg | ORAL_TABLET | Freq: Three times a day (TID) | ORAL | Status: DC

## 2016-04-27 MED ORDER — CYCLOBENZAPRINE HCL 5 MG PO TABS
7.5000 mg | ORAL_TABLET | Freq: Once | ORAL | Status: AC
  Administered 2016-04-27: 7.5 mg via ORAL
  Filled 2016-04-27: qty 2

## 2016-04-27 NOTE — Discharge Instructions (Signed)
Back Pain in Pregnancy Back pain during pregnancy is common. It happens in about half of all pregnancies. It is important for you and your baby that you remain active during your pregnancy.If you feel that back pain is not allowing you to remain active or sleep well, it is time to see your caregiver. Back pain may be caused by several factors related to changes during your pregnancy.Fortunately, unless you had trouble with your back before your pregnancy, the pain is likely to get better after you deliver. Low back pain usually occurs between the fifth and seventh months of pregnancy. It can, however, happen in the first couple months. Factors that increase the risk of back problems include:  1. Previous back problems. 2. Injury to your back. 3. Having twins or multiple births. 4. A chronic cough. 5. Stress. 6. Job-related repetitive motions. 7. Muscle or spinal disease in the back. 8. Family history of back problems, ruptured (herniated) discs, or osteoporosis. 9. Depression, anxiety, and panic attacks. CAUSES  1. When you are pregnant, your body produces a hormone called relaxin. This hormonemakes the ligaments connecting the low back and pubic bones more flexible. This flexibility allows the baby to be delivered more easily. When your ligaments are loose, your muscles need to work harder to support your back. Soreness in your back can come from tired muscles. Soreness can also come from back tissues that are irritated since they are receiving less support. 2. As the baby grows, it puts pressure on the nerves and blood vessels in your pelvis. This can cause back pain. 3. As the baby grows and gets heavier during pregnancy, the uterus pushes the stomach muscles forward and changes your center of gravity. This makes your back muscles work harder to maintain good posture. SYMPTOMS  Lumbar pain during pregnancy Lumbar pain during pregnancy usually occurs at or above the waist in the center of the  back. There may be pain and numbness that radiates into your leg or foot. This is similar to low back pain experienced by non-pregnant women. It usually increases with sitting for long periods of time, standing, or repetitive lifting. Tenderness may also be present in the muscles along your upper back. Posterior pelvic pain during pregnancy Pain in the back of the pelvis is more common than lumbar pain in pregnancy. It is a deep pain felt in your side at the waistline, or across the tailbone (sacrum), or in both places. You may have pain on one or both sides. This pain can also go into the buttocks and backs of the upper thighs. Pubic and groin pain may also be present. The pain does not quickly resolve with rest, and morning stiffness may also be present. Pelvic pain during pregnancy can be brought on by most activities. A high level of fitness before and during pregnancy may or may not prevent this problem. Labor pain is usually 1 to 2 minutes apart, lasts for about 1 minute, and involves a bearing down feeling or pressure in your pelvis. However, if you are at term with the pregnancy, constant low back pain can be the beginning of early labor, and you should be aware of this. DIAGNOSIS  X-rays of the back should not be done during the first 12 to 14 weeks of the pregnancy and only when absolutely necessary during the rest of the pregnancy. MRIs do not give off radiation and are safe during pregnancy. MRIs also should only be done when absolutely necessary. HOME CARE INSTRUCTIONS 1. Exercise  as directed by your caregiver. Exercise is the most effective way to prevent or manage back pain. If you have a back problem, it is especially important to avoid sports that require sudden body movements. Swimming and walking are great activities. 2. Do not stand in one place for long periods of time. 3. Do not wear high heels. 4. Sit in chairs with good posture. Use a pillow on your lower back if necessary. Make  sure your head rests over your shoulders and is not hanging forward. 5. Try sleeping on your side, preferably the left side, with a pillow or two between your legs. If you are sore after a night's rest, your bedmay betoo soft.Try placing a board between your mattress and box spring. 6. Listen to your body when lifting.If you are experiencing pain, ask for help or try bending yourknees more so you can use your leg muscles rather than your back muscles. Squat down when picking up something from the floor. Do not bend over. 7. Eat a healthy diet. Try to gain weight within your caregiver's recommendations. 8. Use heat or cold packs 3 to 4 times a day for 15 minutes to help with the pain. 9. Only take over-the-counter or prescription medicines for pain, discomfort, or fever as directed by your caregiver. Sudden (acute) back pain 1. Use bed rest for only the most extreme, acute episodes of back pain. Prolonged bed rest over 48 hours will aggravate your condition. 2. Ice is very effective for acute conditions. 1. Put ice in a plastic bag. 2. Place a towel between your skin and the bag. 3. Leave the ice on for 10 to 20 minutes every 2 hours, or as needed. 3. Using heat packs for 30 minutes prior to activities is also helpful. Continued back pain See your caregiver if you have continued problems. Your caregiver can help or refer you for appropriate physical therapy. With conditioning, most back problems can be avoided. Sometimes, a more serious issue may be the cause of back pain. You should be seen right away if new problems seem to be developing. Your caregiver may recommend: 1. A maternity girdle. 2. An elastic sling. 3. A back brace. 4. A massage therapist or acupuncture. SEEK MEDICAL CARE IF:  1. You are not able to do most of your daily activities, even when taking the pain medicine you were given. 2. You need a referral to a physical therapist or chiropractor. 3. You want to try  acupuncture. SEEK IMMEDIATE MEDICAL CARE IF: 1. You develop numbness, tingling, weakness, or problems with the use of your arms or legs. 2. You develop severe back pain that is no longer relieved with medicines. 3. You have a sudden change in bowel or bladder control. 4. You have increasing pain in other areas of the body. 5. You develop shortness of breath, dizziness, or fainting. 6. You develop nausea, vomiting, or sweating. 7. You have back pain which is similar to labor pains. 8. You have back pain along with your water breaking or vaginal bleeding. 9. You have back pain or numbness that travels down your leg. 10. Your back pain developed after you fell. 11. You develop pain on one side of your back. You may have a kidney stone. 12. You see blood in your urine. You may have a bladder infection or kidney stone. 13. You have back pain with blisters. You may have shingles. Back pain is fairly common during pregnancy but should not be accepted as just part of  the process. Back pain should always be treated as soon as possible. This will make your pregnancy as pleasant as possible.   This information is not intended to replace advice given to you by your health care provider. Make sure you discuss any questions you have with your health care provider.   Document Released: 02/17/2006 Document Revised: 02/01/2012 Document Reviewed: 03/31/2011 Elsevier Interactive Patient Education 2016 [employer] Inc.  Back Exercises If you have pain in your back, do these exercises 2-3 times each day or as told by your doctor. When the pain goes away, do the exercises once each day, but repeat the steps more times for each exercise (do more repetitions). If you do not have pain in your back, do these exercises once each day or as told by your doctor. EXERCISES Single Knee to Chest Do these steps 3-5 times in a row for each leg: 10. Lie on your back on a firm bed or the floor with your legs stretched  out. 11. Bring one knee to your chest. 12. Hold your knee to your chest by grabbing your knee or thigh. 13. Pull on your knee until you feel a gentle stretch in your lower back. 14. Keep doing the stretch for 10-30 seconds. 15. Slowly let go of your leg and straighten it. Pelvic Tilt Do these steps 5-10 times in a row: 4. Lie on your back on a firm bed or the floor with your legs stretched out. 5. Bend your knees so they point up to the ceiling. Your feet should be flat on the floor. 6. Tighten your lower belly (abdomen) muscles to press your lower back against the floor. This will make your tailbone point up to the ceiling instead of pointing down to your feet or the floor. 7. Stay in this position for 5-10 seconds while you gently tighten your muscles and breathe evenly. Cat-Cow Do these steps until your lower back bends more easily: 10. Get on your hands and knees on a firm surface. Keep your hands under your shoulders, and keep your knees under your hips. You may put padding under your knees. 11. Let your head hang down, and make your tailbone point down to the floor so your lower back is round like the back of a cat. 12. Stay in this position for 5 seconds. 13. Slowly lift your head and make your tailbone point up to the ceiling so your back hangs low (sags) like the back of a cow. 14. Stay in this position for 5 seconds. Press-Ups Do these steps 5-10 times in a row: 4. Lie on your belly (face-down) on the floor. 5. Place your hands near your head, about shoulder-width apart. 6. While you keep your back relaxed and keep your hips on the floor, slowly straighten your arms to raise the top half of your body and lift your shoulders. Do not use your back muscles. To make yourself more comfortable, you may change where you place your hands. 7. Stay in this position for 5 seconds. 8. Slowly return to lying flat on the floor. Bridges Do these steps 10 times in a row: 5. Lie on your back on  a firm surface. 6. Bend your knees so they point up to the ceiling. Your feet should be flat on the floor. 7. Tighten your butt muscles and lift your butt off of the floor until your waist is almost as high as your knees. If you do not feel the muscles working in your butt and  the back of your thighs, slide your feet 1-2 inches farther away from your butt. 8. Stay in this position for 3-5 seconds. 9. Slowly lower your butt to the floor, and let your butt muscles relax. If this exercise is too easy, try doing it with your arms crossed over your chest. Belly Crunches Do these steps 5-10 times in a row: 4. Lie on your back on a firm bed or the floor with your legs stretched out. 5. Bend your knees so they point up to the ceiling. Your feet should be flat on the floor. 6. Cross your arms over your chest. 7. Tip your chin a little bit toward your chest but do not bend your neck. 8. Tighten your belly muscles and slowly raise your chest just enough to lift your shoulder blades a tiny bit off of the floor. 9. Slowly lower your chest and your head to the floor. Back Lifts Do these steps 5-10 times in a row: 14. Lie on your belly (face-down) with your arms at your sides, and rest your forehead on the floor. 15. Tighten the muscles in your legs and your butt. 16. Slowly lift your chest off of the floor while you keep your hips on the floor. Keep the back of your head in line with the curve in your back. Look at the floor while you do this. 17. Stay in this position for 3-5 seconds. 18. Slowly lower your chest and your face to the floor. GET HELP IF:  Your back pain gets a lot worse when you do an exercise.  Your back pain does not lessen 2 hours after you exercise. If you have any of these problems, stop doing the exercises. Do not do them again unless your doctor says it is okay. GET HELP RIGHT AWAY IF:  You have sudden, very bad back pain. If this happens, stop doing the exercises. Do not do  them again unless your doctor says it is okay.   This information is not intended to replace advice given to you by your health care provider. Make sure you discuss any questions you have with your health care provider.   Document Released: 12/12/2010 Document Revised: 07/31/2015 Document Reviewed: 01/03/2015 Elsevier Interactive Patient Education [employer]

## 2016-04-27 NOTE — MAU Note (Signed)
preg with twins, was 1 cm last wk.  Having contractions and lots of pressure.

## 2016-04-27 NOTE — Telephone Encounter (Signed)
Pt informed Protein Creatinine ratio normal per Dr. Emelda FearFerguson. Pt verbalized understanding.

## 2016-04-27 NOTE — Telephone Encounter (Signed)
Pt c/o frequent contractions, a lot of pressure, dilated to 1 cm, no gush of fluids, pregnant with twins. [facility] Pt verbalized understanding.
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
## 2016-04-27 NOTE — Telephone Encounter (Signed)
Left message for pt to be seen this week, for consideration of further work restriction and consideration of work disability. Pt seen at MAU today to R/o PTL and sent home.

## 2016-04-28 NOTE — Telephone Encounter (Signed)
Patient advised to make appointment this week to be seen for assessment for work status and consideration of beginning maternity disability due to twins and decreased stamina at 28 weeks

## 2016-04-30 ENCOUNTER — Ambulatory Visit (INDEPENDENT_AMBULATORY_CARE_PROVIDER_SITE_OTHER): Payer: Medicaid Other | Admitting: Obstetrics and Gynecology

## 2016-04-30 VITALS — BP 130/80 | HR 104 | Wt 204.0 lb

## 2016-04-30 DIAGNOSIS — O30043 Twin pregnancy, dichorionic/diamniotic, third trimester: Secondary | ICD-10-CM

## 2016-04-30 DIAGNOSIS — Z331 Pregnant state, incidental: Secondary | ICD-10-CM

## 2016-04-30 DIAGNOSIS — O09893 Supervision of other high risk pregnancies, third trimester: Secondary | ICD-10-CM

## 2016-04-30 DIAGNOSIS — Z1389 Encounter for screening for other disorder: Secondary | ICD-10-CM | POA: Diagnosis not present

## 2016-04-30 DIAGNOSIS — O99343 Other mental disorders complicating pregnancy, third trimester: Secondary | ICD-10-CM

## 2016-04-30 DIAGNOSIS — O30049 Twin pregnancy, dichorionic/diamniotic, unspecified trimester: Secondary | ICD-10-CM

## 2016-04-30 DIAGNOSIS — Z3A30 30 weeks gestation of pregnancy: Secondary | ICD-10-CM | POA: Diagnosis not present

## 2016-04-30 DIAGNOSIS — O1213 Gestational proteinuria, third trimester: Secondary | ICD-10-CM

## 2016-04-30 LAB — POCT URINALYSIS DIPSTICK
Blood, UA: NEGATIVE
GLUCOSE UA: NEGATIVE
Ketones, UA: NEGATIVE
LEUKOCYTES UA: NEGATIVE
NITRITE UA: NEGATIVE
Protein, UA: NEGATIVE

## 2016-04-30 NOTE — Progress Notes (Signed)
High Risk Pregnancy Diagnosis(es):    [name] / Di twins, symmetric growth.                                                                  Work reduction 28 wk due to pelvic pressure G4P2012 234w1d Estimated Date of Delivery: 07/15/16    HPI: The patient is being seen today for ongoing management of twins, with recent reduced work til 8 hr /wk due to pressure and discomfort.. Today she reports lite yellow brown d/c denies rom. Patient reports good fetal movement, denies any bleeding and no rupture of membranes symptoms or regular contractions. Work reduced to 8 hr /wk  BP weight and urine results reviewed and noted. Blood pressure 130/80, pulse 104, weight 204 lb (92.534 kg), last menstrual period 10/09/2015.  Fetal Surveillance Testing today:  None  Fundal Height:   Fetal Heart rate:  136/143 Edema:  neg Urinalysis: Negative   Questions were answered.  Lab and sonogram results have been reviewed. Comments: discomforts felt to be physiologic pressures of pregnancy   Assessment:  1.  Pregnancy at 8134w1d,  Estimated Date of Delivery: 07/15/16 :  Di/Di twins., stable                        2.  Given out of work note last visit                          Medication(s) Plans:  nonee  Treatment Plan: q 4wk growth scans, Nst biweekly p 35 wk deliver at 38 wk.  Follow up in 2 weeks for appointment for high risk OB care,

## 2016-04-30 NOTE — Progress Notes (Signed)
Pt states that she has a yellow/ brownish discharge that started today.

## 2016-05-01 ENCOUNTER — Ambulatory Visit (INDEPENDENT_AMBULATORY_CARE_PROVIDER_SITE_OTHER): Payer: Medicaid Other | Admitting: Obstetrics & Gynecology

## 2016-05-01 ENCOUNTER — Encounter: Payer: Medicaid Other | Admitting: Obstetrics and Gynecology

## 2016-05-01 ENCOUNTER — Telehealth: Payer: Self-pay | Admitting: *Deleted

## 2016-05-01 VITALS — BP 134/72 | HR 92 | Wt 206.0 lb

## 2016-05-01 DIAGNOSIS — O368120 Decreased fetal movements, second trimester, not applicable or unspecified: Secondary | ICD-10-CM | POA: Diagnosis not present

## 2016-05-01 DIAGNOSIS — Z1389 Encounter for screening for other disorder: Secondary | ICD-10-CM

## 2016-05-01 DIAGNOSIS — O479 False labor, unspecified: Secondary | ICD-10-CM | POA: Diagnosis not present

## 2016-05-01 DIAGNOSIS — M549 Dorsalgia, unspecified: Secondary | ICD-10-CM

## 2016-05-01 DIAGNOSIS — O26893 Other specified pregnancy related conditions, third trimester: Secondary | ICD-10-CM

## 2016-05-01 DIAGNOSIS — O30049 Twin pregnancy, dichorionic/diamniotic, unspecified trimester: Secondary | ICD-10-CM

## 2016-05-01 DIAGNOSIS — Z331 Pregnant state, incidental: Secondary | ICD-10-CM | POA: Diagnosis not present

## 2016-05-01 DIAGNOSIS — R102 Pelvic and perineal pain: Secondary | ICD-10-CM

## 2016-05-01 DIAGNOSIS — O9989 Other specified diseases and conditions complicating pregnancy, childbirth and the puerperium: Secondary | ICD-10-CM

## 2016-05-01 DIAGNOSIS — O99891 Other specified diseases and conditions complicating pregnancy: Secondary | ICD-10-CM

## 2016-05-01 LAB — POCT URINALYSIS DIPSTICK
Glucose, UA: NEGATIVE
KETONES UA: NEGATIVE
Leukocytes, UA: NEGATIVE
Nitrite, UA: NEGATIVE
PROTEIN UA: NEGATIVE
RBC UA: NEGATIVE

## 2016-05-01 MED ORDER — HYDROCODONE-ACETAMINOPHEN 5-325 MG PO TABS: 1.0000 | ORAL_TABLET | Freq: Four times a day (QID) | ORAL | Status: DC | PRN

## 2016-05-01 NOTE — Telephone Encounter (Signed)
Pt c/o "really bad pain lower abdomen and extends to back, decrease FM, no gush of fluids or vaginal bleeding. Pt told to come to office now for evaluation. Pt verbalized understanding. Pt states she is waiting on transportation, pt informed if could not get to our office x 12:30 pm [facility] to our office closing early on Friday's. Pt verbalized understanding.

## 2016-05-01 NOTE — Progress Notes (Signed)
Work in ob appt  Reactive NST x 2  Pt having possible contractions  No bleeding No ROM Some cervical mucous Twins not moving quite as much  Both twins are reactive Cervix LTC firm  No regular contractions on the monitor  Keep scheduled appt

## 2016-05-06 ENCOUNTER — Encounter: Payer: Medicaid Other | Admitting: Obstetrics and Gynecology

## 2016-05-12 ENCOUNTER — Encounter (HOSPITAL_COMMUNITY): Payer: Self-pay

## 2016-05-12 ENCOUNTER — Inpatient Hospital Stay (HOSPITAL_COMMUNITY)
Admission: AD | Admit: 2016-05-12 | Discharge: 2016-05-12 | Disposition: A | Discharge status: Home / Self Care | Payer: Medicaid Other | Source: Ambulatory Visit | Attending: Family Medicine | Admitting: Family Medicine

## 2016-05-12 DIAGNOSIS — O99333 Smoking (tobacco) complicating pregnancy, third trimester: Secondary | ICD-10-CM | POA: Insufficient documentation

## 2016-05-12 DIAGNOSIS — F419 Anxiety disorder, unspecified: Secondary | ICD-10-CM | POA: Diagnosis not present

## 2016-05-12 DIAGNOSIS — F319 Bipolar disorder, unspecified: Secondary | ICD-10-CM | POA: Diagnosis not present

## 2016-05-12 DIAGNOSIS — Z3A30 30 weeks gestation of pregnancy: Secondary | ICD-10-CM | POA: Insufficient documentation

## 2016-05-12 DIAGNOSIS — O99343 Other mental disorders complicating pregnancy, third trimester: Secondary | ICD-10-CM | POA: Diagnosis not present

## 2016-05-12 DIAGNOSIS — O4703 False labor before 37 completed weeks of gestation, third trimester: Secondary | ICD-10-CM

## 2016-05-12 DIAGNOSIS — O479 False labor, unspecified: Secondary | ICD-10-CM

## 2016-05-12 DIAGNOSIS — F909 Attention-deficit hyperactivity disorder, unspecified type: Secondary | ICD-10-CM | POA: Insufficient documentation

## 2016-05-12 DIAGNOSIS — O99613 Diseases of the digestive system complicating pregnancy, third trimester: Secondary | ICD-10-CM | POA: Diagnosis not present

## 2016-05-12 DIAGNOSIS — Z3A36 36 weeks gestation of pregnancy: Secondary | ICD-10-CM | POA: Diagnosis not present

## 2016-05-12 DIAGNOSIS — K219 Gastro-esophageal reflux disease without esophagitis: Secondary | ICD-10-CM | POA: Insufficient documentation

## 2016-05-12 DIAGNOSIS — O30043 Twin pregnancy, dichorionic/diamniotic, third trimester: Secondary | ICD-10-CM | POA: Insufficient documentation

## 2016-05-12 LAB — URINALYSIS, ROUTINE W REFLEX MICROSCOPIC
BILIRUBIN URINE: NEGATIVE
GLUCOSE, UA: NEGATIVE mg/dL
HGB URINE DIPSTICK: NEGATIVE
KETONES UR: NEGATIVE mg/dL
Nitrite: NEGATIVE
PROTEIN: NEGATIVE mg/dL
Specific Gravity, Urine: 1.015 (ref 1.005–1.030)
pH: 6 (ref 5.0–8.0)

## 2016-05-12 LAB — URINE MICROSCOPIC-ADD ON: RBC / HPF: NONE SEEN RBC/hpf (ref 0–5)

## 2016-05-12 NOTE — Discharge Instructions (Signed)
Abdominal Pain During Pregnancy Abdominal pain is common in pregnancy. Most of the time, it does not cause harm. There are many causes of abdominal pain. Some causes are more serious than others. Some of the causes of abdominal pain in pregnancy are easily diagnosed. Occasionally, the diagnosis takes time to understand. Other times, the cause is not determined. Abdominal pain can be a sign that something is very wrong with the pregnancy, or the pain may have nothing to do with the pregnancy at all. For this reason, always tell your health care provider if you have any abdominal discomfort. HOME CARE INSTRUCTIONS  Monitor your abdominal pain for any changes. The following actions may help to alleviate any discomfort you are experiencing:  Do not have sexual intercourse or put anything in your vagina until your symptoms go away completely.  Get plenty of rest until your pain improves.  Drink clear fluids if you feel nauseous. Avoid solid food as long as you are uncomfortable or nauseous.  Only take over-the-counter or prescription medicine as directed by your health care provider.  Keep all follow-up appointments with your health care provider. SEEK IMMEDIATE MEDICAL CARE IF:  You are bleeding, leaking fluid, or passing tissue from the vagina.  You have increasing pain or cramping.  You have persistent vomiting.  You have painful or bloody urination.  You have a fever.  You notice a decrease in your baby's movements.  You have extreme weakness or feel faint.  You have shortness of breath, with or without abdominal pain.  You develop a severe headache with abdominal pain.  You have abnormal vaginal discharge with abdominal pain.  You have persistent diarrhea.  You have abdominal pain that continues even after rest, or gets worse. MAKE SURE YOU:   Understand these instructions.  Will watch your condition.  Will get help right away if you are not doing well or get worse.     This information is not intended to replace advice given to you by your health care provider. Make sure you discuss any questions you have with your health care provider.   Document Released: 11/09/2005 Document Revised: 08/30/2013 Document Reviewed: 06/08/2013 Elsevier Interactive Patient Education 2016 [employer] Inc[employer]  [employer] of the uterus can occur throughout pregnancy. Contractions are not always a sign that you are in labor.  WHAT ARE BRAXTON HICKS CONTRACTIONS?  Contractions that occur before labor are called Braxton Hicks contractions, or false labor. Toward the end of pregnancy (32-34 weeks), these contractions can develop more often and may become more forceful. This is not true labor because these contractions do not result in opening (dilatation) and thinning of the cervix. They are sometimes difficult to tell apart from true labor because these contractions can be forceful and people have different pain tolerances. You should not feel embarrassed if you go to the hospital with false labor. Sometimes, the only way to tell if you are in true labor is for your health care provider to look for changes in the cervix. If there are no prenatal problems or other health problems associated with the pregnancy, it is completely safe to be sent home with false labor and await the onset of true labor. HOW CAN YOU TELL THE DIFFERENCE BETWEEN TRUE AND FALSE LABOR? False Labor  The contractions of false labor are usually shorter and not as hard as those of true labor.   The contractions are usually irregular.   The contractions are often felt in the front of  the lower abdomen and in the groin.   The contractions may go away when you walk around or change positions while lying down.   The contractions get weaker and are shorter lasting as time goes on.   The contractions do not usually become progressively stronger, regular, and closer together as with true  labor.  True Labor  Contractions in true labor last 30-70 seconds, become very regular, usually become more intense, and increase in frequency.   The contractions do not go away with walking.   The discomfort is usually felt in the top of the uterus and spreads to the lower abdomen and low back.   True labor can be determined by your health care provider with an exam. This will show that the cervix is dilating and getting thinner.  WHAT TO REMEMBER  Keep up with your usual exercises and follow other instructions given by your health care provider.   Take medicines as directed by your health care provider.   Keep your regular prenatal appointments.   Eat and drink lightly if you think you are going into labor.   If Braxton Hicks contractions are making you uncomfortable:   Change your position from lying down or resting to walking, or from walking to resting.   Sit and rest in a tub of warm water.   Drink 2-3 glasses of water. Dehydration may cause these contractions.   Do slow and deep breathing several times an hour.  WHEN SHOULD I SEEK IMMEDIATE MEDICAL CARE? Seek immediate medical care if:  Your contractions become stronger, more regular, and closer together.   You have fluid leaking or gushing from your vagina.   You have a fever.   You pass blood-tinged mucus.   You have vaginal bleeding.   You have continuous abdominal pain.   You have low back pain that you never had before.   You feel your baby's head pushing down and causing pelvic pressure.   Your baby is not moving as much as it used to.    This information is not intended to replace advice given to you by your health care provider. Make sure you discuss any questions you have with your health care provider.   Document Released: 11/09/2005 Document Revised: 11/14/2013 Document Reviewed: 08/21/2013 Elsevier Interactive Patient Education 2016 Else[employer] Inc.  Pelvic Rest Pelvic  rest is sometimes recommended for women when:   The placenta is partially or completely covering the opening of the cervix (placenta previa).  There is bleeding between the uterine wall and the amniotic sac in the first trimester (subchorionic hemorrhage).  The cervix begins to open without labor starting (incompetent cervix, cervical insufficiency).  The labor is too early (preterm labor). HOME CARE INSTRUCTIONS  Do not have sexual intercourse, stimulation, or an orgasm.  Do not use tampons, douche, or put anything in the vagina.  Do not lift anything over 10 pounds (4.5 kg).  Avoid strenuous activity or straining your pelvic muscles. SEEK MEDICAL CARE IF:  You have any vaginal bleeding during pregnancy. Treat this as a potential emergency.  You have cramping pain felt low in the stomach (stronger than menstrual cramps).  You notice vaginal discharge (watery, mucus, or bloody).  You have a low, dull backache.  There are regular contractions or uterine tightening. SEEK IMMEDIATE MEDICAL CARE IF: You have vaginal bleeding and have placenta previa.    This information is not intended to replace advice given to you by your health care provider. Make sure you  discuss any questions you have with your health care provider.   Document Released: 03/06/2011 Document Revised: 02/01/2012 Document Reviewed: 05/13/2015 Elsevier Interactive Patient Education 2016 [employer]  Preterm Labor Information Preterm labor is when labor starts at less than 37 weeks of pregnancy. The normal length of a pregnancy is 39 to 41 weeks. CAUSES Often, there is no identifiable underlying cause as to why a woman goes into preterm labor. One of the most common known causes of preterm labor is infection. Infections of the uterus, cervix, vagina, amniotic sac, bladder, kidney, or even the lungs (pneumonia) can cause labor to start. Other suspected causes of preterm labor include:   Urogenital infections,  such as yeast infections and bacterial vaginosis.   Uterine abnormalities (uterine shape, uterine septum, fibroids, or bleeding from the placenta).   A cervix that has been operated on (it may fail to stay closed).   Malformations in the fetus.   Multiple gestations (twins, triplets, and so on).   Breakage of the amniotic sac.  RISK FACTORS  Having a previous history of preterm labor.   Having premature rupture of membranes (PROM).   Having a placenta that covers the opening of the cervix (placenta previa).   Having a placenta that separates from the uterus (placental abruption).   Having a cervix that is too weak to hold the fetus in the uterus (incompetent cervix).   Having too much fluid in the amniotic sac (polyhydramnios).   Taking illegal drugs or smoking while pregnant.   Not gaining enough weight while pregnant.   Being younger than 8718 and older than 32 years old.   Having a low socioeconomic status.   Being African American. SYMPTOMS Signs and symptoms of preterm labor include:   Menstrual-like cramps, abdominal pain, or back pain.  Uterine contractions that are regular, as frequent as six in an hour, regardless of their intensity (may be mild or painful).  Contractions that start on the top of the uterus and spread down to the lower abdomen and back.   A sense of increased pelvic pressure.   A watery or bloody mucus discharge that comes from the vagina.  TREATMENT Depending on the length of the pregnancy and other circumstances, your health care provider may suggest bed rest. If necessary, there are medicines that can be given to stop contractions and to mature the fetal lungs. If labor happens before 34 weeks of pregnancy, a prolonged hospital stay may be recommended. Treatment depends on the condition of both you and the fetus.  WHAT SHOULD YOU DO IF YOU THINK YOU ARE IN PRETERM LABOR? Call your health care provider right away. You will  need to go to the hospital to get checked immediately. HOW CAN YOU PREVENT PRETERM LABOR IN FUTURE PREGNANCIES? You should:   Stop smoking if you smoke.  Maintain healthy weight gain and avoid chemicals and drugs that are not necessary.  Be watchful for any type of infection.  Inform your health care provider if you have a known history of preterm labor.   This information is not intended to replace advice given to you by your health care provider. Make sure you discuss any questions you have with your health care provider.   Document Released: 01/30/2004 Document Revised: 07/12/2013 Document Reviewed: 12/12/2012 Elsevier Interactive Patient Education [employer]2016 [employer] Inc.
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
## 2016-05-12 NOTE — MAU Note (Signed)
Patient presents with c/o of ctxs 5 mins apart. Patient denies and bleeding or LOF. Fetus active.

## 2016-05-14 ENCOUNTER — Ambulatory Visit (INDEPENDENT_AMBULATORY_CARE_PROVIDER_SITE_OTHER): Payer: Medicaid Other | Admitting: Obstetrics and Gynecology

## 2016-05-14 ENCOUNTER — Encounter: Payer: Self-pay | Admitting: Obstetrics and Gynecology

## 2016-05-14 VITALS — BP 118/72 | HR 104 | Wt 206.0 lb

## 2016-05-14 DIAGNOSIS — O09893 Supervision of other high risk pregnancies, third trimester: Secondary | ICD-10-CM | POA: Diagnosis not present

## 2016-05-14 DIAGNOSIS — O30049 Twin pregnancy, dichorionic/diamniotic, unspecified trimester: Secondary | ICD-10-CM

## 2016-05-14 DIAGNOSIS — Z1389 Encounter for screening for other disorder: Secondary | ICD-10-CM

## 2016-05-14 DIAGNOSIS — Z331 Pregnant state, incidental: Secondary | ICD-10-CM | POA: Diagnosis not present

## 2016-05-14 LAB — POCT URINALYSIS DIPSTICK
Glucose, UA: NEGATIVE
KETONES UA: NEGATIVE
Leukocytes, UA: NEGATIVE
NITRITE UA: NEGATIVE
Protein, UA: NEGATIVE
RBC UA: NEGATIVE

## 2016-05-14 NOTE — Progress Notes (Signed)
Patient ID: Alean RinneHeather M Stanko, female   DOB: 01/02/1986, 30 y.o.   MRN: [id number]   High Risk Pregnancy HROB Diagnosis(es):   DiDi twins   [id number] 5254w1d Estimated Date of Delivery: 07/15/16    HPI: The patient is being seen today for ongoing management of DiDi twins . Today she reports no complaints. She states she currently works a maximum of 5 hours per day.  Patient reports good fetal movement, denies any bleeding and no rupture of membranes symptoms or regular contractions.   BP weight and urine results reviewed and noted. Blood pressure 118/72, pulse 104, weight 206 lb (93.441 kg), last menstrual period 10/09/2015.  Fundal Height:  38 cm  Fetal Heart rate:  Baby A: 131 bpm; Baby B: 119 bpm                                      Edema:  None   Urinalysis:NEGATIVE for protein and glucose   Fetal Surveillance Testing today:  none  Lab and sonogram results have been reviewed. Comments: not done   Assessment:  1.  Pregnancy at 7054w1d,  [id number]                           2.  DiDi twins                         3. Reduced work note given at 28 weeks, pt to continue working at her discretion    Medication(s) Plans:  None   Treatment Plan:  q 4wk growth scans, Nst biweekly p 35 wk deliver at 38 wk.  Follow up in 2 weeks for appointment for high risk OB care, fetal US  pt is continuing to work 5 hr days at low effort job.  By signing my name below, I, Doreatha MartinEva Mathews, attest that this documentation has been prepared under the direction and in the presence of Tilda BurrowJohn V Jamica Woodyard, MD. Electronically Signed: Doreatha MartinEva Mathews, ED Scribe. 05/14/2016. 3:38 PM.  I personally performed the services described in this documentation, which was SCRIBED in my presence. The recorded information has been reviewed and considered accurate. It has been edited as necessary during review. Tilda BurrowFERGUSON,Brinly Maietta V, MD

## 2016-05-14 NOTE — Progress Notes (Signed)
Pt denies any problems or concerns at this time.  

## 2016-05-15 ENCOUNTER — Emergency Department (HOSPITAL_COMMUNITY)
Admission: EM | Admit: 2016-05-15 | Discharge: 2016-05-15 | Disposition: A | Discharge status: Home / Self Care | Payer: Medicaid Other | Attending: Dermatology | Admitting: Dermatology

## 2016-05-15 ENCOUNTER — Encounter (HOSPITAL_COMMUNITY): Payer: Self-pay | Admitting: Emergency Medicine

## 2016-05-15 DIAGNOSIS — F909 Attention-deficit hyperactivity disorder, unspecified type: Secondary | ICD-10-CM | POA: Diagnosis not present

## 2016-05-15 DIAGNOSIS — R1012 Left upper quadrant pain: Secondary | ICD-10-CM | POA: Diagnosis not present

## 2016-05-15 DIAGNOSIS — F329 Major depressive disorder, single episode, unspecified: Secondary | ICD-10-CM | POA: Diagnosis not present

## 2016-05-15 DIAGNOSIS — F17210 Nicotine dependence, cigarettes, uncomplicated: Secondary | ICD-10-CM | POA: Insufficient documentation

## 2016-05-15 DIAGNOSIS — Z5321 Procedure and treatment not carried out due to patient leaving prior to being seen by health care provider: Secondary | ICD-10-CM | POA: Insufficient documentation

## 2016-05-15 NOTE — ED Notes (Signed)
Pt not in waiting room

## 2016-05-15 NOTE — ED Notes (Signed)
Pt c/o left upper abd pain since being hit in abdomen while packing boxes yesterday. Denies n/v/d. Pt is [redacted] weeks pregnant with twins.

## 2016-05-18 ENCOUNTER — Telehealth: Payer: Self-pay | Admitting: *Deleted

## 2016-05-18 ENCOUNTER — Telehealth: Payer: Self-pay | Admitting: Obstetrics and Gynecology

## 2016-05-18 NOTE — Telephone Encounter (Signed)
Informed pt was contacting her in regards to a fax received from after line nurse, were she had called with abdominal pain, pt went to ED but did not stay to be evaluated. Pt states she is still having the pain off and on, she was packing up boxes in her house on Friday and feels like she has "bruised her abdominal muscle" +FM, no gush of fluids, no vaginal bleeding. Pt states "babies are fine, just bruised muscle." Offered pt an appt, but pt states she is fine and did not need an appt at this time. Pt informed to call our office back if pain increases, or any complications. Pt verbalized understanding.

## 2016-05-20 NOTE — Telephone Encounter (Signed)
Sleep with twins at 32 wks may be a difficult 'ask". Discuss at followup visit. Celexa is NOT a sleep medication, it is a mild mood stabilizer/ antidepressant.

## 2016-05-20 NOTE — Telephone Encounter (Signed)
LMOM that Dr. Emelda FearFerguson would discuss Celexa and sleep aid at her next visit. Pt also advised that if there were any questions to please call our office.

## 2016-05-26 ENCOUNTER — Inpatient Hospital Stay (HOSPITAL_COMMUNITY)
Admission: AD | Admit: 2016-05-26 | Discharge: 2016-05-26 | Disposition: A | Discharge status: Home / Self Care | Payer: Medicaid Other | Source: Ambulatory Visit | Attending: Obstetrics & Gynecology | Admitting: Obstetrics & Gynecology

## 2016-05-26 ENCOUNTER — Encounter (HOSPITAL_COMMUNITY): Payer: Self-pay | Admitting: *Deleted

## 2016-05-26 DIAGNOSIS — O99333 Smoking (tobacco) complicating pregnancy, third trimester: Secondary | ICD-10-CM | POA: Insufficient documentation

## 2016-05-26 DIAGNOSIS — F17210 Nicotine dependence, cigarettes, uncomplicated: Secondary | ICD-10-CM | POA: Insufficient documentation

## 2016-05-26 DIAGNOSIS — Z880 Allergy status to penicillin: Secondary | ICD-10-CM | POA: Insufficient documentation

## 2016-05-26 DIAGNOSIS — O99613 Diseases of the digestive system complicating pregnancy, third trimester: Secondary | ICD-10-CM | POA: Insufficient documentation

## 2016-05-26 DIAGNOSIS — O99343 Other mental disorders complicating pregnancy, third trimester: Secondary | ICD-10-CM | POA: Insufficient documentation

## 2016-05-26 DIAGNOSIS — R070 Pain in throat: Secondary | ICD-10-CM

## 2016-05-26 DIAGNOSIS — K219 Gastro-esophageal reflux disease without esophagitis: Secondary | ICD-10-CM | POA: Insufficient documentation

## 2016-05-26 DIAGNOSIS — O99513 Diseases of the respiratory system complicating pregnancy, third trimester: Secondary | ICD-10-CM | POA: Insufficient documentation

## 2016-05-26 DIAGNOSIS — J029 Acute pharyngitis, unspecified: Secondary | ICD-10-CM | POA: Diagnosis present

## 2016-05-26 DIAGNOSIS — Z3A32 32 weeks gestation of pregnancy: Secondary | ICD-10-CM | POA: Diagnosis not present

## 2016-05-26 LAB — URINALYSIS, ROUTINE W REFLEX MICROSCOPIC
Bilirubin Urine: NEGATIVE
Glucose, UA: NEGATIVE mg/dL
HGB URINE DIPSTICK: NEGATIVE
Ketones, ur: NEGATIVE mg/dL
NITRITE: NEGATIVE
PROTEIN: NEGATIVE mg/dL
SPECIFIC GRAVITY, URINE: 1.01 (ref 1.005–1.030)
pH: 6.5 (ref 5.0–8.0)

## 2016-05-26 LAB — URINE MICROSCOPIC-ADD ON

## 2016-05-26 LAB — RAPID STREP SCREEN (MED CTR MEBANE ONLY): STREPTOCOCCUS, GROUP A SCREEN (DIRECT): NEGATIVE

## 2016-05-26 NOTE — Progress Notes (Signed)
Dr. Chanetta Marshallimberlake notified of pt in MAU.  Notified that pt is a G4P2 at 8145w6d.  Notified that pt signed in throat swelling but her oxygen level is normal.  Notified that pt also complains of contractions and is pregnant with twins.  Notified that babies are reactive and pt is contracting.  Provider states to collect a strep test and she will come see pt.
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
## 2016-05-26 NOTE — Discharge Instructions (Signed)

## 2016-05-26 NOTE — Progress Notes (Signed)
Provider in a delivery states they will call back.

## 2016-05-26 NOTE — MAU Note (Signed)
Pt states contractions and sore throat started around 0930 this morning.  Pt states she feels like she needs to gag when she talks.

## 2016-05-28 ENCOUNTER — Other Ambulatory Visit: Payer: Self-pay | Admitting: Obstetrics and Gynecology

## 2016-05-28 ENCOUNTER — Other Ambulatory Visit: Payer: Medicaid Other

## 2016-05-28 ENCOUNTER — Encounter: Payer: Medicaid Other | Admitting: Obstetrics & Gynecology

## 2016-05-28 DIAGNOSIS — Z3689 Encounter for other specified antenatal screening: Secondary | ICD-10-CM

## 2016-05-29 ENCOUNTER — Encounter (HOSPITAL_COMMUNITY): Payer: Self-pay

## 2016-05-29 ENCOUNTER — Ambulatory Visit (INDEPENDENT_AMBULATORY_CARE_PROVIDER_SITE_OTHER): Payer: Medicaid Other

## 2016-05-29 ENCOUNTER — Inpatient Hospital Stay (HOSPITAL_COMMUNITY)
Admission: AD | Admit: 2016-05-29 | Discharge: 2016-06-01 | DRG: 778 | Disposition: A | Discharge status: Home / Self Care | Payer: Medicaid Other | Source: Ambulatory Visit | Attending: Obstetrics & Gynecology | Admitting: Obstetrics & Gynecology

## 2016-05-29 ENCOUNTER — Ambulatory Visit (INDEPENDENT_AMBULATORY_CARE_PROVIDER_SITE_OTHER): Payer: Medicaid Other | Admitting: Obstetrics & Gynecology

## 2016-05-29 VITALS — BP 150/88 | HR 96 | Wt 217.0 lb

## 2016-05-29 DIAGNOSIS — O99613 Diseases of the digestive system complicating pregnancy, third trimester: Secondary | ICD-10-CM | POA: Diagnosis present

## 2016-05-29 DIAGNOSIS — Z1389 Encounter for screening for other disorder: Secondary | ICD-10-CM | POA: Diagnosis not present

## 2016-05-29 DIAGNOSIS — R030 Elevated blood-pressure reading, without diagnosis of hypertension: Secondary | ICD-10-CM

## 2016-05-29 DIAGNOSIS — O09893 Supervision of other high risk pregnancies, third trimester: Secondary | ICD-10-CM | POA: Diagnosis not present

## 2016-05-29 DIAGNOSIS — Z331 Pregnant state, incidental: Secondary | ICD-10-CM

## 2016-05-29 DIAGNOSIS — Z3689 Encounter for other specified antenatal screening: Secondary | ICD-10-CM

## 2016-05-29 DIAGNOSIS — O30003 Twin pregnancy, unspecified number of placenta and unspecified number of amniotic sacs, third trimester: Secondary | ICD-10-CM

## 2016-05-29 DIAGNOSIS — Z3A34 34 weeks gestation of pregnancy: Secondary | ICD-10-CM

## 2016-05-29 DIAGNOSIS — Z3A33 33 weeks gestation of pregnancy: Secondary | ICD-10-CM

## 2016-05-29 DIAGNOSIS — F17210 Nicotine dependence, cigarettes, uncomplicated: Secondary | ICD-10-CM | POA: Diagnosis present

## 2016-05-29 DIAGNOSIS — Z36 Encounter for antenatal screening of mother: Secondary | ICD-10-CM | POA: Diagnosis not present

## 2016-05-29 DIAGNOSIS — O30049 Twin pregnancy, dichorionic/diamniotic, unspecified trimester: Secondary | ICD-10-CM

## 2016-05-29 DIAGNOSIS — O99333 Smoking (tobacco) complicating pregnancy, third trimester: Secondary | ICD-10-CM | POA: Diagnosis present

## 2016-05-29 DIAGNOSIS — K219 Gastro-esophageal reflux disease without esophagitis: Secondary | ICD-10-CM | POA: Diagnosis present

## 2016-05-29 DIAGNOSIS — O365930 Maternal care for other known or suspected poor fetal growth, third trimester, not applicable or unspecified: Secondary | ICD-10-CM

## 2016-05-29 DIAGNOSIS — O30043 Twin pregnancy, dichorionic/diamniotic, third trimester: Secondary | ICD-10-CM | POA: Diagnosis present

## 2016-05-29 DIAGNOSIS — Z818 Family history of other mental and behavioral disorders: Secondary | ICD-10-CM

## 2016-05-29 DIAGNOSIS — Z8249 Family history of ischemic heart disease and other diseases of the circulatory system: Secondary | ICD-10-CM

## 2016-05-29 LAB — URINALYSIS, ROUTINE W REFLEX MICROSCOPIC
Bilirubin Urine: NEGATIVE
GLUCOSE, UA: NEGATIVE mg/dL
HGB URINE DIPSTICK: NEGATIVE
Ketones, ur: NEGATIVE mg/dL
Nitrite: NEGATIVE
PROTEIN: NEGATIVE mg/dL
Specific Gravity, Urine: <1.005 — ABNORMAL LOW (ref 1.005–1.030)
pH: 5.5 (ref 5.0–8.0)

## 2016-05-29 LAB — POCT URINALYSIS DIPSTICK
Blood, UA: NEGATIVE
Glucose, UA: NEGATIVE
KETONES UA: NEGATIVE
Leukocytes, UA: NEGATIVE
Nitrite, UA: NEGATIVE

## 2016-05-29 LAB — CULTURE, GROUP A STREP (THRC)

## 2016-05-29 LAB — URINE MICROSCOPIC-ADD ON

## 2016-05-29 MED ORDER — ZOLPIDEM TARTRATE 10 MG PO TABS: 10.0000 mg | ORAL_TABLET | Freq: Every evening | ORAL | Status: DC | PRN

## 2016-05-29 NOTE — MAU Note (Signed)
Having some ctxs and a lot of pelvic pressure and pain in vag. Some brown d/c.

## 2016-05-29 NOTE — Progress Notes (Signed)
US DI/DI TWINS: BABY A : cephalic lt,ant pl gr 0,svp of fluid 7.2 cm,fhr 131 bpm,EFW 1923 g 28%,discordance 20%                              BABY B : cephalic rt,post pl gr 0,svp of fluid 6.3 cm,fhr 140 bpm,EFW 2392 g 61%,bilat adnexa's wnl

## 2016-05-29 NOTE — Progress Notes (Signed)
Fetal Surveillance Testing today:  Sonogram is normal with discordance   High Risk Pregnancy Diagnosis(es):   DiDi twins, 20% discordance  [id number] 4696w2d Estimated Date of Delivery: 07/15/16  Blood pressure (!) 150/88, pulse 96, weight 217 lb (98.4 kg), last menstrual period 10/09/2015, not currently breastfeeding.  Urinalysis: Negative   HPI: The patient is being seen today for ongoing management of as above. Today she reports back pain pelvic pressure, same as it has been   BP weight and urine results all reviewed and noted. Patient reports good fetal movement, denies any bleeding and no rupture of membranes symptoms or regular contractions.  Fundal Height:  40 Fetal Heart rate:  131/140 Edema:  1+  Patient is without complaints other than noted in her HPI. All questions were answered.  All lab and sonogram results have been reviewed. Comments: abnormal:   Assessment:  1.  Pregnancy at 3796w2d,  Estimated Date of Delivery: 07/15/16 :                          2.  DiDi twins                        3.  Borderline BP  Medication(s) Plans:    Treatment Plan:  Twice weekly assessment  Return in about 3 days (around 06/01/2016) for NST, HROB, with Dr Despina HiddenEure. for appointment for high risk OB care  Meds ordered this encounter  Medications  . DISCONTD: zolpidem (AMBIEN) 10 MG tablet    Sig: Take 1 tablet (10 mg total) by mouth at bedtime as needed for sleep.    Dispense:  15 tablet    Refill:  3   Orders Placed This Encounter  Procedures  . POCT urinalysis dipstick

## 2016-05-30 ENCOUNTER — Encounter (HOSPITAL_COMMUNITY): Payer: Self-pay | Admitting: *Deleted

## 2016-05-30 DIAGNOSIS — Z3A33 33 weeks gestation of pregnancy: Secondary | ICD-10-CM | POA: Diagnosis not present

## 2016-05-30 DIAGNOSIS — F17210 Nicotine dependence, cigarettes, uncomplicated: Secondary | ICD-10-CM | POA: Diagnosis present

## 2016-05-30 DIAGNOSIS — O30043 Twin pregnancy, dichorionic/diamniotic, third trimester: Secondary | ICD-10-CM | POA: Diagnosis present

## 2016-05-30 DIAGNOSIS — K219 Gastro-esophageal reflux disease without esophagitis: Secondary | ICD-10-CM | POA: Diagnosis present

## 2016-05-30 DIAGNOSIS — Z8249 Family history of ischemic heart disease and other diseases of the circulatory system: Secondary | ICD-10-CM | POA: Diagnosis not present

## 2016-05-30 DIAGNOSIS — O99613 Diseases of the digestive system complicating pregnancy, third trimester: Secondary | ICD-10-CM | POA: Diagnosis present

## 2016-05-30 DIAGNOSIS — O99333 Smoking (tobacco) complicating pregnancy, third trimester: Secondary | ICD-10-CM | POA: Diagnosis present

## 2016-05-30 DIAGNOSIS — Z818 Family history of other mental and behavioral disorders: Secondary | ICD-10-CM | POA: Diagnosis not present

## 2016-05-30 LAB — CBC
HCT: 32.9 % — ABNORMAL LOW (ref 36.0–46.0)
HEMOGLOBIN: 11 g/dL — AB (ref 12.0–15.0)
MCH: 30.4 pg (ref 26.0–34.0)
MCHC: 33.4 g/dL (ref 30.0–36.0)
MCV: 90.9 fL (ref 78.0–100.0)
Platelets: 175 10*3/uL (ref 150–400)
RBC: 3.62 MIL/uL — AB (ref 3.87–5.11)
RDW: 14.6 % (ref 11.5–15.5)
WBC: 17.8 10*3/uL — ABNORMAL HIGH (ref 4.0–10.5)

## 2016-05-30 LAB — TYPE AND SCREEN
ABO/RH(D): O POS
ANTIBODY SCREEN: NEGATIVE

## 2016-05-30 LAB — WET PREP, GENITAL
SPERM: NONE SEEN
TRICH WET PREP: NONE SEEN
YEAST WET PREP: NONE SEEN

## 2016-05-30 LAB — FETAL FIBRONECTIN: Fetal Fibronectin: POSITIVE — AB

## 2016-05-30 LAB — ABO/RH: ABO/RH(D): O POS

## 2016-05-30 MED ORDER — CEFAZOLIN IN D5W 1 GM/50ML IV SOLN
1.0000 g | Freq: Three times a day (TID) | INTRAVENOUS | Status: DC
  Administered 2016-05-30 – 2016-05-31 (×3): 1 g via INTRAVENOUS
  Filled 2016-05-30 (×4): qty 50

## 2016-05-30 MED ORDER — NICOTINE 21 MG/24HR TD PT24
21.0000 mg | MEDICATED_PATCH | Freq: Every day | TRANSDERMAL | Status: DC
  Administered 2016-05-30 – 2016-05-31 (×2): 21 mg via TRANSDERMAL
  Filled 2016-05-30 (×3): qty 1

## 2016-05-30 MED ORDER — ACETAMINOPHEN 325 MG PO TABS
650.0000 mg | ORAL_TABLET | ORAL | Status: DC | PRN
  Administered 2016-05-31: 650 mg via ORAL
  Filled 2016-05-30: qty 2

## 2016-05-30 MED ORDER — CITALOPRAM HYDROBROMIDE 10 MG PO TABS
10.0000 mg | ORAL_TABLET | Freq: Every day | ORAL | Status: DC
  Administered 2016-05-30 – 2016-05-31 (×2): 10 mg via ORAL
  Filled 2016-05-30 (×3): qty 1

## 2016-05-30 MED ORDER — MAGNESIUM SULFATE BOLUS VIA INFUSION
4.0000 g | Freq: Once | INTRAVENOUS | Status: AC
  Administered 2016-05-30: 4 g via INTRAVENOUS
  Filled 2016-05-30: qty 500

## 2016-05-30 MED ORDER — PANTOPRAZOLE SODIUM 40 MG PO TBEC
40.0000 mg | DELAYED_RELEASE_TABLET | Freq: Two times a day (BID) | ORAL | Status: DC
  Administered 2016-05-30 – 2016-05-31 (×3): 40 mg via ORAL
  Filled 2016-05-30 (×3): qty 1

## 2016-05-30 MED ORDER — LACTATED RINGERS IV SOLN
INTRAVENOUS | Status: DC
  Administered 2016-05-30 (×3): via INTRAVENOUS

## 2016-05-30 MED ORDER — DOCUSATE SODIUM 100 MG PO CAPS
100.0000 mg | ORAL_CAPSULE | Freq: Every day | ORAL | Status: DC
  Administered 2016-05-30 – 2016-05-31 (×2): 100 mg via ORAL
  Filled 2016-05-30 (×3): qty 1

## 2016-05-30 MED ORDER — CALCIUM CARBONATE ANTACID 500 MG PO CHEW
2.0000 | CHEWABLE_TABLET | ORAL | Status: DC | PRN
  Administered 2016-05-30 – 2016-05-31 (×6): 400 mg via ORAL
  Filled 2016-05-30 (×7): qty 2

## 2016-05-30 MED ORDER — ZOLPIDEM TARTRATE 5 MG PO TABS
5.0000 mg | ORAL_TABLET | Freq: Every day | ORAL | Status: DC | PRN
Start: 2016-05-30 — End: 2016-05-31

## 2016-05-30 MED ORDER — FENTANYL CITRATE (PF) 100 MCG/2ML IJ SOLN
100.0000 ug | INTRAMUSCULAR | Status: DC | PRN
  Administered 2016-05-30 (×4): 100 ug via INTRAVENOUS
  Filled 2016-05-30 (×4): qty 2

## 2016-05-30 MED ORDER — ZOLPIDEM TARTRATE 5 MG PO TABS: 5.0000 mg | ORAL_TABLET | Freq: Every evening | ORAL | Status: DC | PRN

## 2016-05-30 MED ORDER — CEFAZOLIN SODIUM-DEXTROSE 2-4 GM/100ML-% IV SOLN
2.0000 g | Freq: Once | INTRAVENOUS | Status: AC
  Administered 2016-05-30: 2 g via INTRAVENOUS

## 2016-05-30 MED ORDER — PRENATAL MULTIVITAMIN CH
1.0000 | ORAL_TABLET | Freq: Every day | ORAL | Status: DC
  Administered 2016-05-30 – 2016-05-31 (×2): 1 via ORAL
  Filled 2016-05-30 (×3): qty 1

## 2016-05-30 MED ORDER — OXYCODONE-ACETAMINOPHEN 5-325 MG PO TABS
2.0000 | ORAL_TABLET | Freq: Once | ORAL | Status: AC
  Administered 2016-05-30: 2 via ORAL
  Filled 2016-05-30: qty 2

## 2016-05-30 MED ORDER — PANTOPRAZOLE SODIUM 40 MG PO TBEC
40.0000 mg | DELAYED_RELEASE_TABLET | Freq: Every day | ORAL | Status: DC
  Administered 2016-05-30: 40 mg via ORAL
  Filled 2016-05-30: qty 1

## 2016-05-30 MED ORDER — BETAMETHASONE SOD PHOS & ACET 6 (3-3) MG/ML IJ SUSP
12.0000 mg | INTRAMUSCULAR | Status: AC
  Administered 2016-05-30 – 2016-05-31 (×2): 12 mg via INTRAMUSCULAR
  Filled 2016-05-30 (×2): qty 2

## 2016-05-30 MED ORDER — HYDROXYZINE HCL 25 MG PO TABS
25.0000 mg | ORAL_TABLET | Freq: Three times a day (TID) | ORAL | Status: DC | PRN
  Administered 2016-05-30 (×2): 25 mg via ORAL
  Filled 2016-05-30 (×3): qty 1

## 2016-05-30 MED ORDER — CEFAZOLIN SODIUM-DEXTROSE 2-4 GM/100ML-% IV SOLN: 2.0000 g | Freq: Four times a day (QID) | INTRAVENOUS | Status: DC

## 2016-05-30 MED ORDER — PROMETHAZINE HCL 25 MG/ML IJ SOLN
12.5000 mg | Freq: Four times a day (QID) | INTRAMUSCULAR | Status: DC | PRN
  Administered 2016-05-30: 12.5 mg via INTRAVENOUS
  Filled 2016-05-30: qty 1

## 2016-05-30 MED ORDER — MORPHINE SULFATE (PF) 4 MG/ML IV SOLN
4.0000 mg | INTRAVENOUS | Status: DC | PRN
  Administered 2016-05-30 – 2016-05-31 (×3): 4 mg via INTRAVENOUS
  Filled 2016-05-30 (×3): qty 1

## 2016-05-30 MED ORDER — MAGNESIUM SULFATE 50 % IJ SOLN
2.0000 g/h | INTRAVENOUS | Status: DC
  Administered 2016-05-30 (×2): 2 g/h via INTRAVENOUS
  Filled 2016-05-30 (×2): qty 80

## 2016-05-30 NOTE — Progress Notes (Addendum)
OB Note  Patient seen and examined and plan of care d/w her. I told her that we will continue with the Mg for tocolysis until 0100 on 7/9 to get BMZ #2. Once that occurs, will d/c Mg and leave patient on EFM for three hours and if no s/s of PTL, then she can come off the monitor for routine surveillance. If she starts to have s/s of PTL, will re ultrasound patient for presentation x 2. Category 2 but patient wanted to come off EFM but is amenable to going back on now.   I forgot to d/w pt but in the chart it states she wants a BTL. Will have RN sign it since she is admitted for PTL.   Cornelia Copaharlie Saidah Kempton, Jr MD Attending Center for [facility](Faculty Practice)
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
## 2016-05-31 LAB — OB RESULTS CONSOLE GBS: STREP GROUP B AG: NEGATIVE

## 2016-05-31 MED ORDER — OXYCODONE-ACETAMINOPHEN 5-325 MG PO TABS
2.0000 | ORAL_TABLET | Freq: Once | ORAL | Status: AC
  Administered 2016-05-31: 2 via ORAL
  Filled 2016-05-31: qty 2

## 2016-05-31 MED ORDER — NIFEDIPINE 10 MG PO CAPS
10.0000 mg | ORAL_CAPSULE | Freq: Four times a day (QID) | ORAL | Status: DC | PRN
  Administered 2016-05-31 (×3): 10 mg via ORAL
  Filled 2016-05-31 (×4): qty 1

## 2016-05-31 MED ORDER — TERBUTALINE SULFATE 1 MG/ML IJ SOLN
0.2500 mg | Freq: Once | INTRAMUSCULAR | Status: AC
  Administered 2016-05-31: 0.25 mg via SUBCUTANEOUS
  Filled 2016-05-31: qty 1

## 2016-05-31 MED ORDER — DIPHENHYDRAMINE HCL 25 MG PO CAPS
25.0000 mg | ORAL_CAPSULE | Freq: Every evening | ORAL | Status: DC | PRN
  Administered 2016-06-01: 25 mg via ORAL
  Filled 2016-05-31: qty 1

## 2016-05-31 MED ORDER — FOLIC ACID 1 MG PO TABS
1.0000 mg | ORAL_TABLET | Freq: Every day | ORAL | Status: DC
  Administered 2016-05-31: 1 mg via ORAL
  Filled 2016-05-31 (×2): qty 1

## 2016-05-31 NOTE — Progress Notes (Addendum)
OB Note CTSP for concern for SROm at approximately 0400 Patient states she had some LOF at around that time. UCs continue but not worsening. No decreased FM or VB AF VS normal and stable A: 125 baseline, +accels, no decel, mod var B: 110 baseline, +accels, no decel, mod var Occasional UCs,+irritability NAD Abd: soft, nttp, gravid SSE: normal white vag d/c in vault, visually 3-4cm, BOW intact, negative cough test SVE: 4-5/75/BBOW intact BSUS: cephalic/cephalic  Negative PTL and SROM evaluation If s/s stable, then can come off monitor in one hour.  BID NSTs Pt still interested in BTL. Patient told of permanence and not a contract and just gives her the option if signed >72hrs and delivers preterm S/p BMZ #2 at 0100 and off Mg and Ancef since then. Continue to follow Pt not in preterm labor but still having some discomfort from the UCs. Offered procardia to help with symptomatic UCs and NOT for treating PTL and pt amenable to this  Cornelia Copaharlie Yang Rack, Jr MD Attending Center for [facility](Faculty Practice)

## 2016-06-01 ENCOUNTER — Other Ambulatory Visit: Payer: Medicaid Other | Admitting: Obstetrics & Gynecology

## 2016-06-01 MED ORDER — NIFEDIPINE ER OSMOTIC RELEASE 30 MG PO TB24: 30.0000 mg | ORAL_TABLET | Freq: Two times a day (BID) | ORAL | Status: DC

## 2016-06-01 MED ORDER — CITALOPRAM HYDROBROMIDE 20 MG PO TABS: 20.0000 mg | ORAL_TABLET | Freq: Every day | ORAL | Status: DC

## 2016-06-01 NOTE — Discharge Instructions (Signed)
Preterm Birth Preterm birth is a birth that happens before 37 weeks of pregnancy. Most pregnancies last about 39-41 weeks. Every week in the womb is important and is beneficial to the health of the infant. Infants born before 37 weeks of pregnancy are at a higher risk for complications. Depending on when the infant was born, he or she may be:  Late preterm. Born between 32 weeks and 37 weeks of pregnancy.  Very preterm. Born at less than 32 weeks of pregnancy.  Extremely preterm. Born at less than 25 weeks of pregnancy. The earlier a baby is born, the more likely the child will have issues related to prematurity. Complications and problems that can be seen in infants born too early include:  Problems breathing (respiratory distress syndrome).  Low birth weight.  Problems feeding.  Sleeping problems.  Yellowing of the skin (jaundice).  Infections such as pneumonia. Babies born very preterm or extremely preterm are at risk for more serious medical issues. These include:  More severe breathing issues.  Eyesight issues.  Brain development issues (intraventricular hemorrhage).  Behavioral and emotional development issues.  Growth and developmental delays.  Cerebral palsy.  Serious feeding or bowel complications (necrotizing enterocolitis). CAUSES  There are two broad categories of preterm birth.  Spontaneous preterm birth. This is a birth resulting from preterm labor (not medically induced) or preterm premature rupture of membranes (PPROM).  Indicated preterm birth. This is a birth resulting from labor being medically induced due to health, personal, or social reasons. RISK FACTORS Preterm birth may be related to certain medical conditions, lifestyle factors, or demographic factors encountered by the mother or fetus.  Medical conditions include:  Multiple gestations (twins, triplets, and so on).  Infection.  Diabetes.  Heart disease.  Kidney disease.  Cervical or  uterine abnormalities.  Being underweight.  High blood pressure or preeclampsia.  Premature rupture of membranes (PROM).  Birth defects in the fetus.  Lifestyle factors include:  Poor prenatal care.  Poor nutrition or anemia.  Cigarette smoking.  Consuming alcohol.  High levels of stress and lack of social or emotional support.  Exposure to chemical or environmental toxins.  Substance abuse.  Demographic factors include:  African-American ethnicity.  Age (younger than 18 or older than 32 years of age).  Low socioeconomic status. Women with a history of preterm labor or who become pregnant within 18 months of giving birth are also at increased risk for preterm birth. DIAGNOSIS  Your health care provider may request additional tests to diagnose underlying complications resulting from preterm birth. Tests on the infant may include:  Physical exam.  Blood tests.  Chest X-rays.  Heart-lung monitoring. TREATMENT  After birth, special care will be taken to assess any problems or complications for the infant. Supportive care will be provided for the infant. Treatment depends on what problems are present and any complications that develop. Some preterm infants are cared for in a neonatal intensive care unit. In general, care may include:  Maintaining temperature and oxygen in a clear heated box (baby isolette).  Monitoring the infant's heart rate, breathing, and level of oxygen in the blood.  Monitoring for signs of infection and, if needed, giving IV antibiotic medicine.  Inserting a feeding tube (nose, mouth) or giving IV nutrition if unable to feed.  Inserting a breathing tube (ventilation).  Respiration support (continuous positive airway pressure [CPAP] or oxygen). Treatment will change as the infant builds up strength and is able to breathe and eat on his or her   own. For some infants, no special treatment is necessary. Parents may be educated on the potential  health risks of prematurity to the infant. HOME CARE INSTRUCTIONS  Understand your infant's special conditions and needs. It may be reassuring to learn about infant CPR.  Monitor your infant in the car seat until he or she grows and matures. Infant car seats can cause breathing difficulties for preterm infants.  Keep your infant warm. Dress your infant in layers and keep him or her away from drafts, especially in cold months of the year.  Wash your hands thoroughly after going to the bathroom or changing a diaper. Late preterm infants may be more prone to infection.  Follow all your health care provider's instructions for providing support and care to your preterm infant.  Get support from organizations and groups that understand your challenges.  Follow up with your infant's health care provider as directed. Prevention There are some things you can do to help lower your risk of having a preterm infant in the future. These include:  Good prenatal care throughout the entire pregnancy. See a health care provider regularly for advice and tests.  Management of underlying medical conditions.  Proper self-care and lifestyle changes.  Proper diet and weight control.  Watching for signs of various infections. SEEK MEDICAL CARE IF:  Your infant has feeding difficulties.  Your infant has sleeping difficulties.  Your infant has breathing difficulties.  Your infant's skin starts to look yellow.  Your infant shows signs of infection, such as a stuffy nose, fever, crying, or bluish color of the skin. FOR MORE INFORMATION [employer]: www.marchofdimes.com Prematurity.org: www.prematurity.org   This information is not intended to replace advice given to you by your health care provider. Make sure you discuss any questions you have with your health care provider.   Document Released: 01/30/2004 Document Revised: 08/30/2013 Document Reviewed: 06/08/2013 Elsevier Interactive Patient  Education 2016 [employer]  

## 2016-06-01 NOTE — Discharge Summary (Signed)
Physician Discharge Summary  Patient ID: Paula RinneHeather M Mccorkel MRN: [id number] DOB/AGE: 32/11/1985 32 y.o.  Admit date: 05/29/2016 Discharge date: 06/01/2016  Admission Diagnoses: Twins preterm labor  Discharge Diagnoses:  Active Problems:   Dichorionic diamniotic twin pregnancy in third trimester   Discharged Condition: stable  Hospital Course: admitted for magnesium tocolysis, betamethasone administration progressed to procardia xl 30 BID, at discharge no contractions and cervix 3.5/th/-2  Consults: None  Significant Diagnostic Studies:   Treatments: as above  Discharge Exam: Blood pressure 119/73, pulse 97, temperature 98.3 F (36.8 C), temperature source Oral, resp. rate 20, height 5\' 3"  (1.6 m), weight 218 lb (98.884 kg), last menstrual period 10/09/2015, SpO2 99 %. General appearance: alert, cooperative and no distress GI: normal findings: consistent with twins, benign abdomen Pelvic: 3-4/th/-2  Disposition: 01-Home or Self Care  Discharge Instructions    Call MD for:    Complete by:  As directed   Regular uterine contractions     Diet - low sodium heart healthy    Complete by:  As directed      Increase activity slowly    Complete by:  As directed             Medication List    TAKE these medications        acetaminophen 325 MG tablet  Commonly known as:  TYLENOL  Take 650 mg by mouth every 6 (six) hours as needed for headache.     citalopram 20 MG tablet  Commonly known as:  CELEXA  Take 1 tablet (20 mg total) by mouth daily.     cyclobenzaprine 10 MG tablet  Commonly known as:  FLEXERIL  Take 1 tablet (10 mg total) by mouth 3 (three) times daily.     DICLEGIS 10-10 MG Tbec  Generic drug:  Doxylamine-Pyridoxine  Take 2 tablets by mouth at bedtime. Reported on 03/05/2016     NIFEdipine 30 MG 24 hr tablet  Commonly known as:  PROCARDIA XL  Take 1 tablet (30 mg total) by mouth 2 (two) times daily.     omeprazole 20 MG capsule  Commonly known as:   PRILOSEC  Take 1 capsule (20 mg total) by mouth daily. 1 tablet a day     prenatal multivitamin Tabs tablet  Take 1 tablet by mouth daily at 12 noon.     zolpidem 10 MG tablet  Commonly known as:  AMBIEN  Take 1 tablet (10 mg total) by mouth at bedtime as needed for sleep.           Follow-up Information    Follow up with Lazaro ArmsEURE,Ilham Roughton H, MD On 06/04/2016.   Specialties:  Obstetrics and Gynecology, Radiology   Why:  keep scheduled   Contact information:   [address] [place]  [place] [id number] [phone]       Signed: Lazaro ArmsURE,Hamsa Laurich H 06/01/2016, 7:49 AM

## 2016-06-01 NOTE — Progress Notes (Signed)
Discharge instructions reviewed with patient. Patient verbalized an understanding of discharge instructions. Discharged ambulatory with family member.

## 2016-06-02 ENCOUNTER — Encounter (HOSPITAL_COMMUNITY): Payer: Self-pay

## 2016-06-02 ENCOUNTER — Inpatient Hospital Stay (HOSPITAL_COMMUNITY)
Admission: AD | Admit: 2016-06-02 | Discharge: 2016-06-03 | Disposition: A | Discharge status: Home / Self Care | Payer: Medicaid Other | Source: Ambulatory Visit | Attending: Obstetrics & Gynecology | Admitting: Obstetrics & Gynecology

## 2016-06-02 DIAGNOSIS — O99333 Smoking (tobacco) complicating pregnancy, third trimester: Secondary | ICD-10-CM | POA: Insufficient documentation

## 2016-06-02 DIAGNOSIS — O99613 Diseases of the digestive system complicating pregnancy, third trimester: Secondary | ICD-10-CM | POA: Insufficient documentation

## 2016-06-02 DIAGNOSIS — F419 Anxiety disorder, unspecified: Secondary | ICD-10-CM | POA: Diagnosis not present

## 2016-06-02 DIAGNOSIS — K219 Gastro-esophageal reflux disease without esophagitis: Secondary | ICD-10-CM | POA: Insufficient documentation

## 2016-06-02 DIAGNOSIS — Z3A33 33 weeks gestation of pregnancy: Secondary | ICD-10-CM | POA: Insufficient documentation

## 2016-06-02 DIAGNOSIS — F319 Bipolar disorder, unspecified: Secondary | ICD-10-CM | POA: Diagnosis not present

## 2016-06-02 DIAGNOSIS — O99343 Other mental disorders complicating pregnancy, third trimester: Secondary | ICD-10-CM | POA: Insufficient documentation

## 2016-06-02 DIAGNOSIS — O30043 Twin pregnancy, dichorionic/diamniotic, third trimester: Secondary | ICD-10-CM

## 2016-06-02 DIAGNOSIS — F909 Attention-deficit hyperactivity disorder, unspecified type: Secondary | ICD-10-CM | POA: Insufficient documentation

## 2016-06-02 DIAGNOSIS — O471 False labor at or after 37 completed weeks of gestation: Secondary | ICD-10-CM | POA: Diagnosis not present

## 2016-06-02 DIAGNOSIS — O479 False labor, unspecified: Secondary | ICD-10-CM

## 2016-06-02 LAB — CULTURE, BETA STREP (GROUP B ONLY)

## 2016-06-02 NOTE — MAU Note (Signed)
Urine sent to lab 

## 2016-06-02 NOTE — Progress Notes (Signed)
Notified of pt arrival in MAU and complaint. Will come see pt 
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
## 2016-06-02 NOTE — MAU Note (Signed)
Pt presents complaining of contractions every 1-2 minutes for the last 3 hours. Took her medicine for contrations but it didn't help. States she is having a brown, watery discharge. Both babies active

## 2016-06-03 DIAGNOSIS — O471 False labor at or after 37 completed weeks of gestation: Secondary | ICD-10-CM | POA: Diagnosis not present

## 2016-06-03 DIAGNOSIS — Z3A33 33 weeks gestation of pregnancy: Secondary | ICD-10-CM | POA: Diagnosis not present

## 2016-06-03 DIAGNOSIS — O4703 False labor before 37 completed weeks of gestation, third trimester: Secondary | ICD-10-CM

## 2016-06-03 LAB — URINALYSIS, ROUTINE W REFLEX MICROSCOPIC
BILIRUBIN URINE: NEGATIVE
GLUCOSE, UA: NEGATIVE mg/dL
HGB URINE DIPSTICK: NEGATIVE
Ketones, ur: NEGATIVE mg/dL
Leukocytes, UA: NEGATIVE
Nitrite: NEGATIVE
PROTEIN: NEGATIVE mg/dL
Specific Gravity, Urine: <1.005 — ABNORMAL LOW (ref 1.005–1.030)
pH: 6 (ref 5.0–8.0)

## 2016-06-03 NOTE — Discharge Instructions (Signed)
Braxton Hicks Contractions °Contractions of the uterus can occur throughout pregnancy. Contractions are not always a sign that you are in labor.  °WHAT ARE BRAXTON HICKS CONTRACTIONS?  °Contractions that occur before labor are called Braxton Hicks contractions, or false labor. Toward the end of pregnancy (32-34 weeks), these contractions can develop more often and may become more forceful. This is not true labor because these contractions do not result in opening (dilatation) and thinning of the cervix. They are sometimes difficult to tell apart from true labor because these contractions can be forceful and people have different pain tolerances. You should not feel embarrassed if you go to the hospital with false labor. Sometimes, the only way to tell if you are in true labor is for your health care provider to look for changes in the cervix. °If there are no prenatal problems or other health problems associated with the pregnancy, it is completely safe to be sent home with false labor and await the onset of true labor. °HOW CAN YOU TELL THE DIFFERENCE BETWEEN TRUE AND FALSE LABOR? °False Labor °· The contractions of false labor are usually shorter and not as hard as those of true labor.   °· The contractions are usually irregular.   °· The contractions are often felt in the front of the lower abdomen and in the groin.   °· The contractions may go away when you walk around or change positions while lying down.   °· The contractions get weaker and are shorter lasting as time goes on.   °· The contractions do not usually become progressively stronger, regular, and closer together as with true labor.   °True Labor °· Contractions in true labor last 30-70 seconds, become very regular, usually become more intense, and increase in frequency.   °· The contractions do not go away with walking.   °· The discomfort is usually felt in the top of the uterus and spreads to the lower abdomen and low back.   °· True labor can be  determined by your health care provider with an exam. This will show that the cervix is dilating and getting thinner.   °WHAT TO REMEMBER °· Keep up with your usual exercises and follow other instructions given by your health care provider.   °· Take medicines as directed by your health care provider.   °· Keep your regular prenatal appointments.   °· Eat and drink lightly if you think you are going into labor.   °· If Braxton Hicks contractions are making you uncomfortable:   °¨ Change your position from lying down or resting to walking, or from walking to resting.   °¨ Sit and rest in a tub of warm water.   °¨ Drink 2-3 glasses of water. Dehydration may cause these contractions.   °¨ Do slow and deep breathing several times an hour.   °WHEN SHOULD I SEEK IMMEDIATE MEDICAL CARE? °Seek immediate medical care if: °· Your contractions become stronger, more regular, and closer together.   °· You have fluid leaking or gushing from your vagina.   °· You have a fever.   °· You pass blood-tinged mucus.   °· You have vaginal bleeding.   °· You have continuous abdominal pain.   °· You have low back pain that you never had before.   °· You feel your baby's head pushing down and causing pelvic pressure.   °· Your baby is not moving as much as it used to.   °  °This information is not intended to replace advice given to you by your health care provider. Make sure you discuss any questions you have with your health care   provider. °  °Document Released: 11/09/2005 Document Revised: 11/14/2013 Document Reviewed: 08/21/2013 °Elsevier Interactive Patient Education ©2016 [employer] ° °

## 2016-06-03 NOTE — MAU Note (Signed)
Difficulty tracing FHR's due to fetal activity and pt discomfort. Twin A and B both active with good variability at times but unable to trace continuously. Pt requesting to have monitors removed and go home. Pt upset that she is not in labor. Dr. Ashok PallWouk notified.

## 2016-06-04 ENCOUNTER — Other Ambulatory Visit: Payer: Medicaid Other

## 2016-06-04 ENCOUNTER — Ambulatory Visit (INDEPENDENT_AMBULATORY_CARE_PROVIDER_SITE_OTHER): Payer: Medicaid Other | Admitting: Obstetrics & Gynecology

## 2016-06-04 ENCOUNTER — Encounter: Payer: Self-pay | Admitting: Obstetrics & Gynecology

## 2016-06-04 ENCOUNTER — Encounter: Payer: Medicaid Other | Admitting: Obstetrics and Gynecology

## 2016-06-04 VITALS — BP 120/60 | HR 80 | Wt <= 1120 oz

## 2016-06-04 DIAGNOSIS — O09893 Supervision of other high risk pregnancies, third trimester: Secondary | ICD-10-CM

## 2016-06-04 DIAGNOSIS — O30049 Twin pregnancy, dichorionic/diamniotic, unspecified trimester: Secondary | ICD-10-CM

## 2016-06-04 DIAGNOSIS — Z1389 Encounter for screening for other disorder: Secondary | ICD-10-CM | POA: Diagnosis not present

## 2016-06-04 DIAGNOSIS — Z331 Pregnant state, incidental: Secondary | ICD-10-CM | POA: Diagnosis not present

## 2016-06-04 DIAGNOSIS — O30043 Twin pregnancy, dichorionic/diamniotic, third trimester: Secondary | ICD-10-CM

## 2016-06-04 DIAGNOSIS — Z3A34 34 weeks gestation of pregnancy: Secondary | ICD-10-CM

## 2016-06-04 LAB — POCT URINALYSIS DIPSTICK
Blood, UA: NEGATIVE
Glucose, UA: NEGATIVE
KETONES UA: NEGATIVE
Leukocytes, UA: NEGATIVE
Nitrite, UA: NEGATIVE
PROTEIN UA: NEGATIVE

## 2016-06-04 MED ORDER — PRAMOXINE-HC-CHLOROXYLENOL 10-10-1 MG/ML OT SOLN: 2.0000 [drp] | Freq: Two times a day (BID) | OTIC | Status: DC

## 2016-06-04 NOTE — Progress Notes (Signed)
Fetal Surveillance Testing today:  REACTIVE nst X 2   High Risk Pregnancy Diagnosis(es):   dIdI TWINS  [id number] 7378w1d Estimated Date of Delivery: 07/15/16  Blood pressure 120/60, pulse 80, weight 22 lb 6.4 oz (10.2 kg), last menstrual period 10/09/2015, not currently breastfeeding.  Urinalysis: Negative   HPI: The patient is being seen today for ongoing management of AS ABOVE. Today she reports CONTRACTIONS AS SHE HAS BEEN HAVING   BP weight and urine results all reviewed and noted. Patient reports good fetal movement, denies any bleeding and no rupture of membranes symptoms or regular contractions.  Fundal Height:  42 Fetal Heart rate:  130/135 Edema:  none  Patient is without complaints other than noted in her HPI. All questions were answered.  All lab and sonogram results have been reviewed. Comments: abnormal: twins   Assessment:  1.  Pregnancy at 2678w1d,  Estimated Date of Delivery: 07/15/16 :                          2.  DiDi twins                        3.  Discordance A 20% smaller than B  Medication(s) Plans:  Prn procardia  Treatment Plan:  Twice weekly surveillance, delivery at 36-37 weeks  Return in about 5 days (around 06/09/2016) for NST, HROB, with Dr Despina HiddenEure. for appointment for high risk OB care  Meds ordered this encounter  Medications  . DISCONTD: Pramoxine-HC-Chloroxylenol 10-10-1 MG/ML SOLN    Sig: Place 2 drops in ear(s) 2 (two) times daily.    Dispense:  10 mL    Refill:  0   Orders Placed This Encounter  Procedures  . POCT urinalysis dipstick

## 2016-06-07 ENCOUNTER — Encounter (HOSPITAL_COMMUNITY): Payer: Self-pay | Admitting: *Deleted

## 2016-06-07 ENCOUNTER — Inpatient Hospital Stay (HOSPITAL_COMMUNITY)
Admission: AD | Admit: 2016-06-07 | Discharge: 2016-06-07 | Disposition: A | Discharge status: Home / Self Care | Payer: Medicaid Other | Source: Ambulatory Visit | Attending: Obstetrics & Gynecology | Admitting: Obstetrics & Gynecology

## 2016-06-07 DIAGNOSIS — O99343 Other mental disorders complicating pregnancy, third trimester: Secondary | ICD-10-CM | POA: Insufficient documentation

## 2016-06-07 DIAGNOSIS — F17210 Nicotine dependence, cigarettes, uncomplicated: Secondary | ICD-10-CM | POA: Diagnosis not present

## 2016-06-07 DIAGNOSIS — K219 Gastro-esophageal reflux disease without esophagitis: Secondary | ICD-10-CM | POA: Diagnosis not present

## 2016-06-07 DIAGNOSIS — O99333 Smoking (tobacco) complicating pregnancy, third trimester: Secondary | ICD-10-CM | POA: Insufficient documentation

## 2016-06-07 DIAGNOSIS — O99613 Diseases of the digestive system complicating pregnancy, third trimester: Secondary | ICD-10-CM | POA: Insufficient documentation

## 2016-06-07 DIAGNOSIS — Z3A34 34 weeks gestation of pregnancy: Secondary | ICD-10-CM | POA: Diagnosis not present

## 2016-06-07 DIAGNOSIS — F319 Bipolar disorder, unspecified: Secondary | ICD-10-CM | POA: Insufficient documentation

## 2016-06-07 DIAGNOSIS — N898 Other specified noninflammatory disorders of vagina: Secondary | ICD-10-CM | POA: Diagnosis not present

## 2016-06-07 DIAGNOSIS — O26893 Other specified pregnancy related conditions, third trimester: Secondary | ICD-10-CM

## 2016-06-07 DIAGNOSIS — F419 Anxiety disorder, unspecified: Secondary | ICD-10-CM | POA: Diagnosis not present

## 2016-06-07 DIAGNOSIS — Z880 Allergy status to penicillin: Secondary | ICD-10-CM | POA: Diagnosis not present

## 2016-06-07 DIAGNOSIS — O42913 Preterm premature rupture of membranes, unspecified as to length of time between rupture and onset of labor, third trimester: Secondary | ICD-10-CM | POA: Insufficient documentation

## 2016-06-07 DIAGNOSIS — F909 Attention-deficit hyperactivity disorder, unspecified type: Secondary | ICD-10-CM | POA: Diagnosis not present

## 2016-06-07 LAB — URINALYSIS, ROUTINE W REFLEX MICROSCOPIC
Bilirubin Urine: NEGATIVE
GLUCOSE, UA: NEGATIVE mg/dL
HGB URINE DIPSTICK: NEGATIVE
KETONES UR: NEGATIVE mg/dL
Leukocytes, UA: NEGATIVE
Nitrite: NEGATIVE
PROTEIN: NEGATIVE mg/dL
Specific Gravity, Urine: <1.005 — ABNORMAL LOW (ref 1.005–1.030)
pH: 5.5 (ref 5.0–8.0)

## 2016-06-07 LAB — AMNISURE RUPTURE OF MEMBRANE (ROM) NOT AT ARMC: Amnisure ROM: NEGATIVE

## 2016-06-07 MED ORDER — BENZONATATE 100 MG PO CAPS: 200.0000 mg | ORAL_CAPSULE | Freq: Three times a day (TID) | ORAL | Status: DC | PRN

## 2016-06-07 NOTE — MAU Note (Addendum)
C/o ?SROM yesterday afternoon around 1300; c/o cold and cough with R ear fullness for past 2 days;
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
## 2016-06-08 ENCOUNTER — Telehealth: Payer: Self-pay | Admitting: Obstetrics & Gynecology

## 2016-06-08 ENCOUNTER — Other Ambulatory Visit: Payer: Self-pay | Admitting: Obstetrics & Gynecology

## 2016-06-08 NOTE — Telephone Encounter (Signed)
[facility], was sent home and told everything normal.   Pt c/o stuffy nose, cough, OTC medication not helping, [facility]. Pt states also the Ear drop medication (unsure of med name) that Dr.Eure prescribed also not covered by [employer].   Please advise.

## 2016-06-08 NOTE — Telephone Encounter (Signed)
Tell her to get OTC swimmers ear for her ears and OTC alka seltzer day/night formula for her cold syptoms  Medicaid does not have an alternative

## 2016-06-08 NOTE — Telephone Encounter (Signed)
Pt informed of Dr. Eure's recommendations.  °

## 2016-06-09 ENCOUNTER — Ambulatory Visit (INDEPENDENT_AMBULATORY_CARE_PROVIDER_SITE_OTHER): Payer: Medicaid Other | Admitting: Obstetrics & Gynecology

## 2016-06-09 VITALS — BP 138/80 | HR 96 | Wt 216.0 lb

## 2016-06-09 DIAGNOSIS — Z1389 Encounter for screening for other disorder: Secondary | ICD-10-CM

## 2016-06-09 DIAGNOSIS — Z3A35 35 weeks gestation of pregnancy: Secondary | ICD-10-CM

## 2016-06-09 DIAGNOSIS — O09893 Supervision of other high risk pregnancies, third trimester: Secondary | ICD-10-CM

## 2016-06-09 DIAGNOSIS — O30043 Twin pregnancy, dichorionic/diamniotic, third trimester: Secondary | ICD-10-CM

## 2016-06-09 DIAGNOSIS — Z331 Pregnant state, incidental: Secondary | ICD-10-CM

## 2016-06-09 DIAGNOSIS — O30049 Twin pregnancy, dichorionic/diamniotic, unspecified trimester: Secondary | ICD-10-CM

## 2016-06-09 LAB — POCT URINALYSIS DIPSTICK
Blood, UA: NEGATIVE
GLUCOSE UA: NEGATIVE
Ketones, UA: NEGATIVE
LEUKOCYTES UA: NEGATIVE
NITRITE UA: NEGATIVE
PROTEIN UA: NEGATIVE

## 2016-06-09 NOTE — Progress Notes (Signed)
Fetal Surveillance Testing today:  Reactive NST x 2   High Risk Pregnancy Diagnosis(es):   DiDi twins  [id number] 2373w6d Estimated Date of Delivery: 07/15/16  Blood pressure 138/80, pulse 96, weight 216 lb (97.977 kg), last menstrual period 10/09/2015.  Urinalysis: Negative   HPI: The patient is being seen today for ongoing management of DiDi twins with growth discordance. Today she reports lower pelvic pressure, irregular contractions   BP weight and urine results all reviewed and noted. Patient reports good fetal movement, denies any bleeding and no rupture of membranes symptoms or regular contractions.  Fundal Height:  44 Fetal Heart rate:  135/135 Edema:  none  Patient is without complaints other than noted in her HPI. All questions were answered.  All lab and sonogram results have been reviewed. Comments: abnormal:    Assessment:  1.  Pregnancy at 5873w6d,  Estimated Date of Delivery: 07/15/16 :                          2.  DiDi twins                        3.  20% discordance A<B  Medication(s) Plans:  Prn procardia   Treatment Plan:  Twice weekly testing, sonogram next wek, if remains discordant deliver at 36-37 week depending on surveillance etc  No Follow-up on file. for appointment for high risk OB care  No orders of the defined types were placed in this encounter.   Orders Placed This Encounter  Procedures  . POCT urinalysis dipstick

## 2016-06-10 ENCOUNTER — Telehealth: Payer: Self-pay | Admitting: Obstetrics & Gynecology

## 2016-06-10 NOTE — Telephone Encounter (Signed)
Pharmacy informed pt was given alternative OTC medication (swimmers ear, ear drops) because insurance would not cover original RX.

## 2016-06-10 NOTE — Telephone Encounter (Signed)
Pharmacy called stating that they have a question regarding [name]'s prescription. They state that the medication was sent on 05/22/2016. Please contact pharmacy

## 2016-06-12 ENCOUNTER — Ambulatory Visit (INDEPENDENT_AMBULATORY_CARE_PROVIDER_SITE_OTHER): Payer: Medicaid Other | Admitting: Obstetrics & Gynecology

## 2016-06-12 ENCOUNTER — Encounter: Payer: Self-pay | Admitting: Obstetrics & Gynecology

## 2016-06-12 VITALS — BP 130/80 | HR 78 | Wt 214.0 lb

## 2016-06-12 DIAGNOSIS — O30003 Twin pregnancy, unspecified number of placenta and unspecified number of amniotic sacs, third trimester: Secondary | ICD-10-CM

## 2016-06-12 DIAGNOSIS — Z1389 Encounter for screening for other disorder: Secondary | ICD-10-CM

## 2016-06-12 DIAGNOSIS — Z331 Pregnant state, incidental: Secondary | ICD-10-CM

## 2016-06-12 DIAGNOSIS — O1203 Gestational edema, third trimester: Secondary | ICD-10-CM | POA: Diagnosis not present

## 2016-06-12 DIAGNOSIS — O09893 Supervision of other high risk pregnancies, third trimester: Secondary | ICD-10-CM | POA: Diagnosis not present

## 2016-06-12 DIAGNOSIS — O30049 Twin pregnancy, dichorionic/diamniotic, unspecified trimester: Secondary | ICD-10-CM

## 2016-06-12 DIAGNOSIS — Z3A35 35 weeks gestation of pregnancy: Secondary | ICD-10-CM | POA: Diagnosis not present

## 2016-06-12 DIAGNOSIS — Z3A36 36 weeks gestation of pregnancy: Secondary | ICD-10-CM | POA: Diagnosis not present

## 2016-06-12 DIAGNOSIS — O30043 Twin pregnancy, dichorionic/diamniotic, third trimester: Secondary | ICD-10-CM | POA: Diagnosis not present

## 2016-06-12 DIAGNOSIS — O365930 Maternal care for other known or suspected poor fetal growth, third trimester, not applicable or unspecified: Secondary | ICD-10-CM

## 2016-06-12 LAB — POCT URINALYSIS DIPSTICK
Blood, UA: NEGATIVE
Glucose, UA: NEGATIVE
Ketones, UA: NEGATIVE
LEUKOCYTES UA: NEGATIVE
NITRITE UA: NEGATIVE
PROTEIN UA: NEGATIVE

## 2016-06-12 MED ORDER — AZITHROMYCIN 250 MG PO TABS: ORAL_TABLET | ORAL | Status: DC

## 2016-06-12 NOTE — Progress Notes (Signed)
Fetal Surveillance Testing today:  NST x 2, reactive   High Risk Pregnancy Diagnosis(es):   DiDi twins  [id number] 6749w2d Estimated Date of Delivery: 07/15/16  Blood pressure 130/80, pulse 78, weight 214 lb (97.1 kg), last menstrual period 10/09/2015, not currently breastfeeding.  Urinalysis: Negative   HPI: The patient is being seen today for ongoing management of didi twins. Today she reports back ache pelvic pressure   BP weight and urine results all reviewed and noted. Patient reports good fetal movement, denies any bleeding and no rupture of membranes symptoms or regular contractions.  Fundal Height:  42 Fetal Heart rate:  135/140 Edema:  1+  Patient is without complaints other than noted in her HPI. All questions were answered.  All lab and sonogram results have been reviewed. Comments:    Assessment:  1.  Pregnancy at 8049w2d,  Estimated Date of Delivery: 07/15/16 :                          2.  DiDi twins                        3.  Musculoskeletal pain  Medication(s) Plans:    Treatment Plan:  Continued twice weekly surveillance  Return in about 4 days (around 06/16/2016) for BPP/sono, HROB, with Dr Despina HiddenEure. for appointment for high risk OB care  Meds ordered this encounter  Medications  . DISCONTD: azithromycin (ZITHROMAX Z-PAK) 250 MG tablet    Sig: Take 2 tablets today and then 1 a day til finished    Dispense:  6 each    Refill:  0   Orders Placed This Encounter  Procedures  . US Fetal BPP W/O Non Stress  . US OB Follow Up  . POCT urinalysis dipstick

## 2016-06-13 ENCOUNTER — Inpatient Hospital Stay (HOSPITAL_COMMUNITY): Payer: Medicaid Other | Admitting: Anesthesiology

## 2016-06-13 ENCOUNTER — Encounter (HOSPITAL_COMMUNITY): Payer: Self-pay | Admitting: *Deleted

## 2016-06-13 ENCOUNTER — Inpatient Hospital Stay (HOSPITAL_COMMUNITY)
Admission: AD | Admit: 2016-06-13 | Discharge: 2016-06-16 | DRG: 775 | Disposition: A | Discharge status: Home / Self Care | Payer: Medicaid Other | Source: Ambulatory Visit | Attending: Family Medicine | Admitting: Family Medicine

## 2016-06-13 DIAGNOSIS — O9962 Diseases of the digestive system complicating childbirth: Secondary | ICD-10-CM | POA: Diagnosis present

## 2016-06-13 DIAGNOSIS — F319 Bipolar disorder, unspecified: Secondary | ICD-10-CM | POA: Diagnosis present

## 2016-06-13 DIAGNOSIS — Z818 Family history of other mental and behavioral disorders: Secondary | ICD-10-CM | POA: Diagnosis not present

## 2016-06-13 DIAGNOSIS — Z8249 Family history of ischemic heart disease and other diseases of the circulatory system: Secondary | ICD-10-CM

## 2016-06-13 DIAGNOSIS — O42013 Preterm premature rupture of membranes, onset of labor within 24 hours of rupture, third trimester: Secondary | ICD-10-CM | POA: Diagnosis present

## 2016-06-13 DIAGNOSIS — K219 Gastro-esophageal reflux disease without esophagitis: Secondary | ICD-10-CM | POA: Diagnosis present

## 2016-06-13 DIAGNOSIS — O99354 Diseases of the nervous system complicating childbirth: Secondary | ICD-10-CM | POA: Diagnosis present

## 2016-06-13 DIAGNOSIS — O99334 Smoking (tobacco) complicating childbirth: Secondary | ICD-10-CM | POA: Diagnosis present

## 2016-06-13 DIAGNOSIS — Z3A35 35 weeks gestation of pregnancy: Secondary | ICD-10-CM

## 2016-06-13 DIAGNOSIS — O30043 Twin pregnancy, dichorionic/diamniotic, third trimester: Secondary | ICD-10-CM | POA: Diagnosis present

## 2016-06-13 DIAGNOSIS — O99344 Other mental disorders complicating childbirth: Secondary | ICD-10-CM | POA: Diagnosis present

## 2016-06-13 DIAGNOSIS — F17210 Nicotine dependence, cigarettes, uncomplicated: Secondary | ICD-10-CM | POA: Diagnosis present

## 2016-06-13 DIAGNOSIS — O4290 Premature rupture of membranes, unspecified as to length of time between rupture and onset of labor, unspecified weeks of gestation: Secondary | ICD-10-CM | POA: Diagnosis present

## 2016-06-13 DIAGNOSIS — F445 Conversion disorder with seizures or convulsions: Secondary | ICD-10-CM | POA: Diagnosis present

## 2016-06-13 LAB — CBC
HEMATOCRIT: 33.7 % — AB (ref 36.0–46.0)
HEMOGLOBIN: 11.2 g/dL — AB (ref 12.0–15.0)
MCH: 29.6 pg (ref 26.0–34.0)
MCHC: 33.2 g/dL (ref 30.0–36.0)
MCV: 88.9 fL (ref 78.0–100.0)
Platelets: 162 10*3/uL (ref 150–400)
RBC: 3.79 MIL/uL — ABNORMAL LOW (ref 3.87–5.11)
RDW: 14.5 % (ref 11.5–15.5)
WBC: 16.5 10*3/uL — ABNORMAL HIGH (ref 4.0–10.5)

## 2016-06-13 LAB — POCT FERN TEST: POCT Fern Test: POSITIVE

## 2016-06-13 LAB — TYPE AND SCREEN
ABO/RH(D): O POS
Antibody Screen: NEGATIVE

## 2016-06-13 MED ORDER — LACTATED RINGERS IV SOLN: 500.0000 mL | INTRAVENOUS | Status: DC | PRN

## 2016-06-13 MED ORDER — LACTATED RINGERS IV SOLN
INTRAVENOUS | Status: DC
Start: 2016-06-13 — End: 2016-06-16
  Administered 2016-06-13 (×2): via INTRAVENOUS

## 2016-06-13 MED ORDER — ONDANSETRON HCL 4 MG/2ML IJ SOLN
4.0000 mg | Freq: Four times a day (QID) | INTRAMUSCULAR | Status: DC | PRN
  Administered 2016-06-14: 4 mg via INTRAVENOUS
  Filled 2016-06-13: qty 2

## 2016-06-13 MED ORDER — PHENYLEPHRINE 40 MCG/ML (10ML) SYRINGE FOR IV PUSH (FOR BLOOD PRESSURE SUPPORT)
PREFILLED_SYRINGE | INTRAVENOUS | Status: AC
  Filled 2016-06-13: qty 10

## 2016-06-13 MED ORDER — OXYTOCIN BOLUS FROM INFUSION
500.0000 mL | INTRAVENOUS | Status: DC
  Administered 2016-06-14: 500 mL via INTRAVENOUS

## 2016-06-13 MED ORDER — SOD CITRATE-CITRIC ACID 500-334 MG/5ML PO SOLN
30.0000 mL | ORAL | Status: DC | PRN
  Administered 2016-06-14: 30 mL via ORAL
  Filled 2016-06-13: qty 15

## 2016-06-13 MED ORDER — OXYTOCIN 40 UNITS IN LACTATED RINGERS INFUSION - SIMPLE MED
1.0000 m[IU]/min | INTRAVENOUS | Status: DC
  Administered 2016-06-13: 2 m[IU]/min via INTRAVENOUS
  Filled 2016-06-13: qty 1000

## 2016-06-13 MED ORDER — FENTANYL 2.5 MCG/ML BUPIVACAINE 1/10 % EPIDURAL INFUSION (WH - ANES)
14.0000 mL/h | INTRAMUSCULAR | Status: DC | PRN
  Administered 2016-06-13 – 2016-06-14 (×2): 14 mL/h via EPIDURAL
  Filled 2016-06-13 (×2): qty 125

## 2016-06-13 MED ORDER — OXYCODONE-ACETAMINOPHEN 5-325 MG PO TABS
1.0000 | ORAL_TABLET | ORAL | Status: DC | PRN
  Administered 2016-06-14 – 2016-06-15 (×3): 1 via ORAL
  Filled 2016-06-13 (×3): qty 1

## 2016-06-13 MED ORDER — EPHEDRINE 5 MG/ML INJ
10.0000 mg | INTRAVENOUS | Status: DC | PRN
  Filled 2016-06-13: qty 4

## 2016-06-13 MED ORDER — PHENYLEPHRINE 40 MCG/ML (10ML) SYRINGE FOR IV PUSH (FOR BLOOD PRESSURE SUPPORT): 80.0000 ug | PREFILLED_SYRINGE | INTRAVENOUS | Status: DC | PRN

## 2016-06-13 MED ORDER — OXYCODONE-ACETAMINOPHEN 5-325 MG PO TABS
2.0000 | ORAL_TABLET | ORAL | Status: DC | PRN
  Administered 2016-06-14 – 2016-06-16 (×5): 2 via ORAL
  Filled 2016-06-13 (×6): qty 2

## 2016-06-13 MED ORDER — LACTATED RINGERS IV SOLN
500.0000 mL | Freq: Once | INTRAVENOUS | Status: AC
  Administered 2016-06-13: 500 mL via INTRAVENOUS

## 2016-06-13 MED ORDER — LACTATED RINGERS IV SOLN: 500.0000 mL | Freq: Once | INTRAVENOUS | Status: DC

## 2016-06-13 MED ORDER — LIDOCAINE HCL (PF) 1 % IJ SOLN
INTRAMUSCULAR | Status: DC | PRN
  Administered 2016-06-13 (×2): 4 mL via EPIDURAL

## 2016-06-13 MED ORDER — OXYTOCIN 40 UNITS IN LACTATED RINGERS INFUSION - SIMPLE MED: 2.5000 [IU]/h | INTRAVENOUS | Status: DC

## 2016-06-13 MED ORDER — LIDOCAINE HCL (PF) 1 % IJ SOLN
30.0000 mL | INTRAMUSCULAR | Status: DC | PRN
  Filled 2016-06-13: qty 30

## 2016-06-13 MED ORDER — TERBUTALINE SULFATE 1 MG/ML IJ SOLN
0.2500 mg | Freq: Once | INTRAMUSCULAR | Status: DC | PRN
  Filled 2016-06-13: qty 1

## 2016-06-13 MED ORDER — PHENYLEPHRINE 40 MCG/ML (10ML) SYRINGE FOR IV PUSH (FOR BLOOD PRESSURE SUPPORT)
80.0000 ug | PREFILLED_SYRINGE | INTRAVENOUS | Status: DC | PRN
  Filled 2016-06-13: qty 5

## 2016-06-13 MED ORDER — FENTANYL 2.5 MCG/ML BUPIVACAINE 1/10 % EPIDURAL INFUSION (WH - ANES)
INTRAMUSCULAR | Status: AC
  Filled 2016-06-13: qty 125

## 2016-06-13 MED ORDER — EPHEDRINE 5 MG/ML INJ: 10.0000 mg | INTRAVENOUS | Status: DC | PRN

## 2016-06-13 MED ORDER — ACETAMINOPHEN 325 MG PO TABS
650.0000 mg | ORAL_TABLET | ORAL | Status: DC | PRN
  Filled 2016-06-13: qty 2

## 2016-06-13 MED ORDER — DIPHENHYDRAMINE HCL 50 MG/ML IJ SOLN: 12.5000 mg | INTRAMUSCULAR | Status: DC | PRN

## 2016-06-13 NOTE — Anesthesia Procedure Notes (Signed)
Epidural Patient location during procedure: OB Start time: 06/13/2016 8:47 PM End time: 06/13/2016 8:54 PM  Staffing Anesthesiologist: Shona Simpson D Performed by: anesthesiologist   Preanesthetic Checklist Completed: patient identified, site marked, surgical consent, pre-op evaluation, timeout performed, IV checked, risks and benefits discussed and monitors and equipment checked  Epidural Patient position: sitting Prep: ChloraPrep Patient monitoring: heart rate, continuous pulse ox and blood pressure Approach: midline Location: L3-L4 Injection technique: LOR saline  Needle:  Needle type: Tuohy  Needle gauge: 17 G Needle length: 9 cm Catheter type: closed end flexible Catheter size: 20 Guage Test dose: negative and 1.5% lidocaine  Assessment Events: blood not aspirated, injection not painful, no injection resistance and no paresthesia  Additional Notes LOR @ 6.5  Patient identified. Risks/Benefits/Options discussed with patient including but not limited to bleeding, infection, nerve damage, paralysis, failed block, incomplete pain control, headache, blood pressure changes, nausea, vomiting, reactions to medications, itching and postpartum back pain. Confirmed with bedside nurse the patient's most recent platelet count. Confirmed with patient that they are not currently taking any anticoagulation, have any bleeding history or any family history of bleeding disorders. Patient expressed understanding and wished to proceed. All questions were answered. Sterile technique was used throughout the entire procedure. Please see nursing notes for vital signs. Test dose was given through epidural catheter and negative prior to continuing to dose epidural or start infusion. Warning signs of high block given to the patient including shortness of breath, tingling/numbness in hands, complete motor block, or any concerning symptoms with instructions to call for help. Patient was given instructions on  fall risk and not to get out of bed. All questions and concerns addressed with instructions to call with any issues or inadequate analgesia.    Reason for block:procedure for pain

## 2016-06-13 NOTE — Anesthesia Preprocedure Evaluation (Signed)
Anesthesia Evaluation  Patient identified by MRN, date of birth, ID band Patient awake    Reviewed: Allergy & Precautions, NPO status , Patient's Chart, lab work & pertinent test results  Airway Mallampati: II       Dental   Pulmonary Current Smoker,    breath sounds clear to auscultation       Cardiovascular negative cardio ROS   Rhythm:Regular Rate:Normal     Neuro/Psych  Headaches, Seizures -,  PSYCHIATRIC DISORDERS Anxiety Depression Bipolar Disorder    GI/Hepatic Neg liver ROS, GERD  Medicated,  Endo/Other  negative endocrine ROS  Renal/GU negative Renal ROS  negative genitourinary   Musculoskeletal negative musculoskeletal ROS (+)   Abdominal   Peds negative pediatric ROS (+)  Hematology negative hematology ROS (+)   Anesthesia Other Findings   Reproductive/Obstetrics (+) Pregnancy                             Lab Results  Component Value Date   WBC 16.5* 06/13/2016   HGB 11.2* 06/13/2016   HCT 33.7* 06/13/2016   MCV 88.9 06/13/2016   PLT 162 06/13/2016   No results found for: INR, PROTIME   Anesthesia Physical Anesthesia Plan  ASA: III  Anesthesia Plan: Epidural   Post-op Pain Management:    Induction:   Airway Management Planned:   Additional Equipment:   Intra-op Plan:   Post-operative Plan:   Informed Consent: I have reviewed the patients History and Physical, chart, labs and discussed the procedure including the risks, benefits and alternatives for the proposed anesthesia with the patient or authorized representative who has indicated his/her understanding and acceptance.     Plan Discussed with:   Anesthesia Plan Comments:         Anesthesia Quick Evaluation
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
## 2016-06-13 NOTE — MAU Note (Signed)
Pt reports her water broke around 615pm. Having some ctx not very many.good fetal movement reported

## 2016-06-14 ENCOUNTER — Encounter (HOSPITAL_COMMUNITY): Payer: Self-pay

## 2016-06-14 DIAGNOSIS — O42013 Preterm premature rupture of membranes, onset of labor within 24 hours of rupture, third trimester: Secondary | ICD-10-CM

## 2016-06-14 DIAGNOSIS — F17210 Nicotine dependence, cigarettes, uncomplicated: Secondary | ICD-10-CM

## 2016-06-14 DIAGNOSIS — Z3A35 35 weeks gestation of pregnancy: Secondary | ICD-10-CM

## 2016-06-14 DIAGNOSIS — O99334 Smoking (tobacco) complicating childbirth: Secondary | ICD-10-CM

## 2016-06-14 DIAGNOSIS — O30043 Twin pregnancy, dichorionic/diamniotic, third trimester: Secondary | ICD-10-CM

## 2016-06-14 LAB — HEPATITIS B SURFACE ANTIGEN: HEP B S AG: NEGATIVE

## 2016-06-14 LAB — RPR: RPR Ser Ql: NONREACTIVE

## 2016-06-14 MED ORDER — MEASLES, MUMPS & RUBELLA VAC ~~LOC~~ INJ
0.5000 mL | INJECTION | Freq: Once | SUBCUTANEOUS | Status: DC
  Filled 2016-06-14: qty 0.5

## 2016-06-14 MED ORDER — WITCH HAZEL-GLYCERIN EX PADS: 1.0000 "application " | MEDICATED_PAD | CUTANEOUS | Status: DC | PRN

## 2016-06-14 MED ORDER — AZITHROMYCIN 250 MG PO TABS
250.0000 mg | ORAL_TABLET | Freq: Every day | ORAL | Status: AC
  Administered 2016-06-14 – 2016-06-16 (×3): 250 mg via ORAL
  Filled 2016-06-14 (×3): qty 1

## 2016-06-14 MED ORDER — SENNOSIDES-DOCUSATE SODIUM 8.6-50 MG PO TABS
2.0000 | ORAL_TABLET | ORAL | Status: DC
  Administered 2016-06-14 – 2016-06-16 (×2): 2 via ORAL
  Filled 2016-06-14 (×2): qty 2

## 2016-06-14 MED ORDER — CITALOPRAM HYDROBROMIDE 20 MG PO TABS
20.0000 mg | ORAL_TABLET | Freq: Every day | ORAL | Status: DC
  Administered 2016-06-14 – 2016-06-16 (×3): 20 mg via ORAL
  Filled 2016-06-14 (×4): qty 1

## 2016-06-14 MED ORDER — BENZONATATE 100 MG PO CAPS
200.0000 mg | ORAL_CAPSULE | Freq: Three times a day (TID) | ORAL | Status: DC
  Administered 2016-06-14 – 2016-06-16 (×7): 200 mg via ORAL
  Filled 2016-06-14 (×9): qty 2

## 2016-06-14 MED ORDER — BENZOCAINE-MENTHOL 20-0.5 % EX AERO
1.0000 "application " | INHALATION_SPRAY | CUTANEOUS | Status: DC | PRN
  Administered 2016-06-14: 1 via TOPICAL
  Filled 2016-06-14: qty 56

## 2016-06-14 MED ORDER — ACETAMINOPHEN 325 MG PO TABS: 650.0000 mg | ORAL_TABLET | ORAL | Status: DC | PRN

## 2016-06-14 MED ORDER — PRENATAL MULTIVITAMIN CH
1.0000 | ORAL_TABLET | Freq: Every day | ORAL | Status: DC
  Administered 2016-06-15 – 2016-06-16 (×2): 1 via ORAL
  Filled 2016-06-14 (×2): qty 1

## 2016-06-14 MED ORDER — SODIUM CHLORIDE 0.9% FLUSH: 3.0000 mL | INTRAVENOUS | Status: DC | PRN

## 2016-06-14 MED ORDER — TETANUS-DIPHTH-ACELL PERTUSSIS 5-2.5-18.5 LF-MCG/0.5 IM SUSP: 0.5000 mL | Freq: Once | INTRAMUSCULAR | Status: DC

## 2016-06-14 MED ORDER — DIBUCAINE 1 % RE OINT: 1.0000 "application " | TOPICAL_OINTMENT | RECTAL | Status: DC | PRN

## 2016-06-14 MED ORDER — ONDANSETRON HCL 4 MG PO TABS: 4.0000 mg | ORAL_TABLET | ORAL | Status: DC | PRN

## 2016-06-14 MED ORDER — ZOLPIDEM TARTRATE 5 MG PO TABS: 5.0000 mg | ORAL_TABLET | Freq: Every evening | ORAL | Status: DC | PRN

## 2016-06-14 MED ORDER — PNEUMOCOCCAL VAC POLYVALENT 25 MCG/0.5ML IJ INJ
0.5000 mL | INJECTION | INTRAMUSCULAR | Status: DC
  Filled 2016-06-14: qty 0.5

## 2016-06-14 MED ORDER — DIPHENHYDRAMINE HCL 25 MG PO CAPS: 25.0000 mg | ORAL_CAPSULE | Freq: Four times a day (QID) | ORAL | Status: DC | PRN

## 2016-06-14 MED ORDER — SIMETHICONE 80 MG PO CHEW: 80.0000 mg | CHEWABLE_TABLET | ORAL | Status: DC | PRN

## 2016-06-14 MED ORDER — LORATADINE 10 MG PO TABS
10.0000 mg | ORAL_TABLET | Freq: Every day | ORAL | Status: DC
  Administered 2016-06-14 – 2016-06-16 (×3): 10 mg via ORAL
  Filled 2016-06-14 (×3): qty 1

## 2016-06-14 MED ORDER — COCONUT OIL OIL: 1.0000 "application " | TOPICAL_OIL | Status: DC | PRN

## 2016-06-14 MED ORDER — ONDANSETRON HCL 4 MG/2ML IJ SOLN: 4.0000 mg | INTRAMUSCULAR | Status: DC | PRN

## 2016-06-14 MED ORDER — SODIUM CHLORIDE 0.9% FLUSH
3.0000 mL | Freq: Two times a day (BID) | INTRAVENOUS | Status: DC
  Administered 2016-06-15: 3 mL via INTRAVENOUS

## 2016-06-14 MED ORDER — IBUPROFEN 600 MG PO TABS
600.0000 mg | ORAL_TABLET | Freq: Four times a day (QID) | ORAL | Status: DC
  Administered 2016-06-14 – 2016-06-16 (×9): 600 mg via ORAL
  Filled 2016-06-14 (×8): qty 1

## 2016-06-14 MED ORDER — SODIUM CHLORIDE 0.9 % IV SOLN: 250.0000 mL | INTRAVENOUS | Status: DC | PRN

## 2016-06-14 MED ORDER — PANTOPRAZOLE SODIUM 40 MG PO TBEC
40.0000 mg | DELAYED_RELEASE_TABLET | Freq: Every day | ORAL | Status: DC
  Administered 2016-06-15 – 2016-06-16 (×2): 40 mg via ORAL
  Filled 2016-06-14 (×3): qty 1

## 2016-06-14 NOTE — Lactation Note (Signed)
This note was copied from a baby's chart. Lactation Consultation Note  Patient Name: [name] [id number] Date: 06/14/2016 Reason for consult: Initial assessment;Multiple gestation;Infant < 6lbs Mom plans to pump/bottle feed. Does not want to latch babies. Advised to pump every 3 hours for 15 minutes followed by 5 minutes hand expression. Mom declined demonstration of hand expression at this visit. Reviewed importance of consistent pumping to encourage milk production and protect milk supply. Mom does not have DEBP for home. Has WIC with [facility] Advised to call WIC in am to see about DEBP for d/c home. Advise LC if needs help with this. Lactation brochure left for review, advised of OP services and support group. Encouraged to call for questions/concerns.   Maternal Data Has patient been taught Hand Expression?: No (Mom declined at this visit) Does the patient have breastfeeding experience prior to this delivery?: No  Feeding    LATCH Score/Interventions                      Lactation Tools Discussed/Used Tools: Pump Breast pump type: Double-Electric Breast Pump WIC Program: Yes Initiated by:: RN Date initiated:: 06/14/16   Consult Status Consult Status: Follow-up Date: 06/15/16 Follow-up type: In-patient    Alfred Levins 06/14/2016, 8:13 PM

## 2016-06-14 NOTE — Progress Notes (Signed)
At 2205 pt returned from NICU after visiting baby A. She was short of breath with chest and back discomfort. Her vital signs were stable and the patient expressed a previous history of panic attacks, none recent per pt. I did turn the lights down and try to help her relax and breath. The patient has been having significant productive coughing episodes throughout the evening.  Did offer pain intervention and emotional support. D.Lawson CNM notified of event. No new orders ATT.

## 2016-06-14 NOTE — Consult Note (Signed)
[facility]  Delivery Note:  SVD     06/14/2016  9:38 AM  I was called to the delivery room at the request of the patient's obstetrician (Dr. Adrian Blackwater) for a twin delivery at 68 and 4/[redacted] weeks gestation.  PRENATAL HX:  This is a 30 y/o [id number] who was admitted yesterday for SROM at 6:30 pm (ROM 15 hours).  Her pregnancy has been complicated by di-di twin gestation, depression and anxiety, + HPV, and tobacco use.  Both twins were delivered vertex with cord clamping delayed for 1 minute.  She received BMZ x2 at 33 weeks for an episode of preterm labor.    DELIVERY BABY A:  Infant was vigorous at delivery, requiring no resuscitation other than standard warming, drying and stimulation.  APGARs 8 and 9.  A pulse oximeter was applied and O2 saturations were in the high 90s by 10 minutes.  Birthweight 2010g.  Exam within normal limits.  After 15 minutes, baby left with nurse to assist parents with skin-to-skin care.  Will admit to mother baby unit for now with close monitoring for problems associated with late preterm gestation.    DELIVERY BABY :  Infant was vigorous at delivery, initially requiring no resuscitation other than standard warming, drying and stimulation.  APGARs 8, 8, and 9.  A pulse oximeter was applied at 5 mintues for continued central cynaosis, and O2 saturations were in the mid 70s.  Blow by O2 administered from 6-8 minutes with appropriate rise in O2 saturations.  O2 saturations in low 90s in RA by 10 minutes.  Exam within normal limits.  After 15 minutes, baby left with nurse to assist parents with skin-to-skin care.  Will admit to mother baby unit for now with close monitoring for problems associated with late preterm gestation.    _____________________ Electronically Signed By: Maryan Char, MD Neonatologist

## 2016-06-15 DIAGNOSIS — Z3A35 35 weeks gestation of pregnancy: Secondary | ICD-10-CM

## 2016-06-15 DIAGNOSIS — O99334 Smoking (tobacco) complicating childbirth: Secondary | ICD-10-CM

## 2016-06-15 DIAGNOSIS — F445 Conversion disorder with seizures or convulsions: Secondary | ICD-10-CM

## 2016-06-15 DIAGNOSIS — O9962 Diseases of the digestive system complicating childbirth: Secondary | ICD-10-CM

## 2016-06-15 DIAGNOSIS — O42013 Preterm premature rupture of membranes, onset of labor within 24 hours of rupture, third trimester: Secondary | ICD-10-CM

## 2016-06-15 DIAGNOSIS — O99344 Other mental disorders complicating childbirth: Secondary | ICD-10-CM

## 2016-06-15 DIAGNOSIS — F319 Bipolar disorder, unspecified: Secondary | ICD-10-CM

## 2016-06-15 DIAGNOSIS — O99354 Diseases of the nervous system complicating childbirth: Secondary | ICD-10-CM

## 2016-06-15 DIAGNOSIS — O30043 Twin pregnancy, dichorionic/diamniotic, third trimester: Secondary | ICD-10-CM

## 2016-06-15 MED ORDER — LACTATED RINGERS IV SOLN: INTRAVENOUS | Status: DC

## 2016-06-15 MED ORDER — METOCLOPRAMIDE HCL 10 MG PO TABS: 10.0000 mg | ORAL_TABLET | Freq: Once | ORAL | Status: DC

## 2016-06-15 MED ORDER — PNEUMOCOCCAL VAC POLYVALENT 25 MCG/0.5ML IJ INJ
0.5000 mL | INJECTION | INTRAMUSCULAR | Status: AC
  Administered 2016-06-16: 0.5 mL via INTRAMUSCULAR
  Filled 2016-06-15: qty 0.5

## 2016-06-15 MED ORDER — MEASLES, MUMPS & RUBELLA VAC ~~LOC~~ INJ
0.5000 mL | INJECTION | Freq: Once | SUBCUTANEOUS | Status: DC
  Filled 2016-06-15: qty 0.5

## 2016-06-15 MED ORDER — FAMOTIDINE 20 MG PO TABS: 40.0000 mg | ORAL_TABLET | Freq: Once | ORAL | Status: DC

## 2016-06-15 NOTE — Progress Notes (Signed)
CSW acknowledged consult for MOB.  CSW attempted to meet with MOB, however, MOB had family members and friends visiting. CSW will attempt to meet with MOB at a later time.

## 2016-06-15 NOTE — Anesthesia Postprocedure Evaluation (Signed)
Anesthesia Post Note  Patient: [name]  Procedure(s) Performed: * No procedures listed *  Patient location during evaluation: Mother Baby Anesthesia Type: Epidural Level of consciousness: awake, awake and alert, oriented and patient cooperative Pain management: pain level controlled Vital Signs Assessment: post-procedure vital signs reviewed and stable Respiratory status: spontaneous breathing, nonlabored ventilation and respiratory function stable Cardiovascular status: stable Postop Assessment: patient able to bend at knees, no headache, no backache and no signs of nausea or vomiting Anesthetic complications: no     Last Vitals:  Vitals:   06/15/16 0300 06/15/16 0730  BP: 131/68 (!) 148/77  Pulse: 68 66  Resp: 18 20  Temp: 36.8 C 36.7 C    Last Pain:  Vitals:   06/15/16 0730  TempSrc: Oral  PainSc:    Pain Goal: Patients Stated Pain Goal: 5 (06/15/16 0624)               Thaniel Coluccio L

## 2016-06-15 NOTE — Lactation Note (Signed)
This note was copied from a baby's chart. Lactation Consultation Note  Patient Name: [name] [id number]'L Date: 06/15/2016   Twins, 25 hours old. Twin Boy "A" in NICU, and Boy "B" LPI in mom's room on MBU. Mom about to visit baby in NICU. Mom states that she has pumped twice but needs assistance. Left this LC extension on mom's board and enc to call when she returns to the room for assistance.   Maternal Data    Feeding    LATCH Score/Interventions                      Lactation Tools Discussed/Used     Consult Status      Geralynn Ochs 06/15/2016, 11:12 AM

## 2016-06-15 NOTE — Progress Notes (Signed)
Post Partum Day 1  Subjective:  [name] is a 30 y.o. [id number] 100w4d s/p SVD.  No acute events overnight.  Pt denies problems with ambulating, voiding or po intake.  She denies nausea or vomiting.  Pain is well controlled.  She has had flatus. She has not had bowel movement.  Lochia Large.  Plan for birth control is bilateral tubal ligation.  Method of Feeding: pump breast milk   Objective: BP (!) 148/77   Pulse 66   Temp 98.1 F (36.7 C) (Oral)   Resp 20   Ht 5\' 3"  (1.6 m)   Wt 98 kg (216 lb)   LMP 10/09/2015   SpO2 100%   Breastfeeding? Unknown   BMI 38.26 kg/m   Physical Exam:  General: alert, cooperative and no distress Lochia:normal flow Chest: normal work of breathing  Heart: RRR  Abdomen: +BS, soft, nontender, fundus firm at/below umbilicus    Recent Labs  06/13/16 1940  HGB 11.2*  HCT 33.7*    Assessment/Plan:  ASSESSMENT: [name] is a 30 y.o. G4W1027 [redacted]w[redacted]d ppd #1 s/p NSVD doing well.   Plan for discharge tomorrow. Twin boys will get circ outpatient.    LOS: 2 days   Bayard Hugger 06/15/2016, 7:51 AM   CNM attestation Post Partum Day #1  [name] is a 30 y.o. [phone] s/p twin SVD.  Pt denies problems with ambulating, voiding or po intake. Pain is well controlled.  Plan for birth control is bilateral tubal ligation.  Method of Feeding: breast/pumping; one twin went to NICU last PM for blood sugar stabilization; pt hopeful for BTL this AM- has been NPO overnight  PE:  BP (!) 148/77   Pulse 66   Temp 98.1 F (36.7 C) (Oral)   Resp 20   Ht 5\' 3"  (1.6 m)   Wt 98 kg (216 lb)   LMP 10/09/2015   SpO2 100%   Breastfeeding? Unknown   BMI 38.26 kg/m  Fundus firm  Plan :  Find appropriate documentation of 30d tubal papers and proceed w/ procedure this morning  Cam Hai, CNM 8:11 AM  06/15/2016

## 2016-06-15 NOTE — Lactation Note (Signed)
This note was copied from a baby's chart. Lactation Consultation Note Initial visit at 33 hours of age.  Mom is eager to attempt latching baby "B" who has been bottle fed formula. Mom has attempted pumping for baby "A" who is in NICU, but has not been able to collect with pumping yet.  Mom is complaining of pain when RN assisted with hand expression and is having high anxiety about feedings.  LC assisted with placing baby STS in cross cradle hold. With minimal assist baby latched well with strong rhythmic sucking for several minutes.  Good jaw movements noted and instructed mom to watch for during feedings.  Baby then needed stimulation to maintain feedings. Mom will continue to offer feedings on demand and also limit feedings to 30 minutes with supplementation due to LPI status.  Mom will post pump and save for baby "A" in NICU.  LC reported to [facility] RN and is aware of feeding plan.  MOm to call for assist as needed.     Patient Name: Caiden Dollarhide [id number] Date: 06/15/2016     Maternal Data    Feeding Feeding Type: Formula Length of feed: 40 min  LATCH Score/Interventions                      Lactation Tools Discussed/Used     Consult Status      Breianna Delfino, Arvella Merles 06/15/2016, 8:58 PM

## 2016-06-16 ENCOUNTER — Encounter: Payer: Medicaid Other | Admitting: Obstetrics & Gynecology

## 2016-06-16 ENCOUNTER — Other Ambulatory Visit: Payer: Medicaid Other

## 2016-06-16 LAB — RUBELLA SCREEN: Rubella: 2.47 index (ref >0.99)

## 2016-06-16 MED ORDER — OXYCODONE-ACETAMINOPHEN 5-325 MG PO TABS: 1.0000 | ORAL_TABLET | Freq: Four times a day (QID) | ORAL | Status: DC | PRN

## 2016-06-16 MED ORDER — OXYCODONE-ACETAMINOPHEN 5-325 MG PO TABS: 1.0000 | ORAL_TABLET | Freq: Four times a day (QID) | ORAL | 0 refills | Status: DC | PRN

## 2016-06-16 MED ORDER — IBUPROFEN 600 MG PO TABS
600.0000 mg | ORAL_TABLET | Freq: Four times a day (QID) | ORAL | 0 refills | Status: DC
Start: 2016-06-16 — End: 2017-03-16

## 2016-06-16 NOTE — Lactation Note (Addendum)
This note was copied from a baby's chart. Lactation Consultation Note  Patient Name: [name] Date: 06/16/2016 Reason for consult: Follow-up assessment;NICU baby  Twins 42 hours old, Baby Boy "A" in NICU, and Baby Boy "B" in mom's room on MBU, Mom reports that she is not interested in breastfeeding or pumping at this time. Mom is aware of the possible impact to breast milk supply if breasts are not stimulated, but reiterated that she "cannot handle" pumping or breastfeeding at this time. Mom stated that she may change her mind later, maybe when she gets home or maybe if her milk "comes in." Discussed with mom that Benzonatate, Tessalon Perles, are not compatible with breastfeeding. Benzonatate is an L4 (possibly hazardous) according to [employer], "Medications and Mother's Milk." Enc mom to discuss this medication with her HCP if she decides that she wants to provide breast milk for babies. Mom states that she understands she should not put babies to breast or provide EBM for babies while taking the [employer].     Discussed medication with patient's bedside nurse Meriam Sprague, RN and NICU nurse, Fannie Knee, RN, who reported that mom had already notified her that she would no longer be pumping or putting baby to breast.   Maternal Data    Feeding Feeding Type: Formula Nipple Type: Slow - flow Length of feed: 35 min  LATCH Score/Interventions                      Lactation Tools Discussed/Used     Consult Status Consult Status: PRN    Geralynn Ochs 06/16/2016, 3:01 PM

## 2016-06-16 NOTE — Clinical Social Work Maternal (Signed)
CLINICAL SOCIAL WORK MATERNAL/CHILD NOTE  Patient Details  Name: MONSERAT PRESTIGIACOMO MRN: [id number] Date of Birth: 1985/12/10  Date:  06/16/2016  Clinical Social Worker Initiating Note:  Laurey Arrow Date/ Time Initiated:  06/16/16/1053     Child's Name:  Rodman Key and Lavell Anchors   Legal Guardian:  Mother   Need for Interpreter:  None   Date of Referral:  06/15/16     Reason for Referral:  Adoption    Referral Source:  NICU   Address:  [address] [place] [address]  Phone number:  [phone]   Household Members:  Minor Children   Natural Supports (not living in the home):  Church, Immediate Family, Extended Family, Parent   Professional Supports: None   Employment: Full-time   Type of Work: Programme researcher, broadcasting/film/video at [employer]   Education:  9 to 11 years   Museum/gallery curator Resources:  [employer]   Other Resources:  [employer], Physicist, medical    Cultural/Religious Considerations Which May Impact Care:  Per [employer], MOB is Patent attorney  Strengths:  Ability to meet basic needs , Understanding of illness, Home prepared for child , Pediatrician chosen    Risk Factors/Current Problems:  Mental Health Concerns    Cognitive State:  Distractible  (sleepy)   Mood/Affect:  Relaxed , Calm , Comfortable    CSW Assessment: CSW met with MOB to complete an assessment for a consult for bipolar d/o.  MOB was polite and receptive to meeting with CSW.  CSW inquired about the twin's health, and MOB reported that the twin in NICU [place]) is making progress, but MOB is not for sure why his blood platelets was decreasing.  CSW encouraged MOB to write down her questions and ask the NICU staff. MOB also reported the other twin on MBU Rodman Key), is doing well. MOB appeared to be tired as evidence by her consistent yawn and closing of her eyes while CSW was completing the assessment. CSW attempted to reschedule with MOB, however, MOB was adamant about meeting with CSW at this time.  CSW  inquired about MOB's MH hx, and MOB reported she was dx with bipolar dx about 10 years ago.  MOB informed CSW that MOB was regulated on medication for about 4 yrs after being dx, but has not been on any medication with the exception of Celexa in about 6 years. CSW offered MOB resources and referrals for MH treatment and MOB declined.  MOB stated she will reach out to her PCP if a need arise.  CSW educated MOB about PPD, and inquired about MOB's experience of PPD with MOB's oldest son.  MOB denied any signs of symptoms with PPD with her oldest child, and was receptive to the information that CSW shared.  CSW informed MOB of possible supports and interventions to decrease PPD.  CSW also encouraged MOB to seek medical attention if needed for increased signs and symptoms for PPD.   CSW also educated MOB about SIDS and Safe Sleep.  MOB appeared knowledgeable as evidence by MOB's  responses to CSW questions.  MOB communicated she has a crib for the twins, and feels prepared to take her boys home.   MOB denied any barriers to visiting with twin in NICU once MOB discharge from the hospital.  MOB reported that she has a support tesm that is willing to help as much as they can.  MOB did not have any further questions or concerns at this time.  CSW Plan/Description:  Information/Referral  to [employer] , [employer] , No Further Intervention Required/No Barriers to Discharge, Psychosocial Support and Ongoing Assessment of Needs    Joby Hershkowitz D BOYD-GILYARD, LCSW 06/16/2016, 10:57 AM

## 2016-06-16 NOTE — Progress Notes (Signed)
Mother is not pumping currently. She has been instructed on how to pump and has equipment. She states she is not able to "handle pumping at this time". Offered support and reassurance.

## 2016-06-16 NOTE — Discharge Summary (Signed)
Obstetric Discharge Summary Reason for Admission: onset of labor Prenatal Procedures: didi twins, +tobacco use, +anxiety/depression/bipolar Intrapartum Procedures: spontaneous vaginal delivery Postpartum Procedures: none Complications-Operative and Postpartum: none Hemoglobin  Date Value Ref Range Status  06/13/2016 11.2 (L) 12.0 - 15.0 g/dL Final   HGB  Date Value Ref Range Status  11/30/2012 14.3 12.0 - 16.0 g/dL Final   HCT  Date Value Ref Range Status  06/13/2016 33.7 (L) 36.0 - 46.0 % Final   Hematocrit  Date Value Ref Range Status  04/22/2016 33.1 (L) 34.0 - 46.6 % Final    Physical Exam:  General: alert, cooperative and no distress Lochia: appropriate Uterine Fundus: firm DVT Evaluation: No evidence of DVT seen on physical exam. Negative Homan's sign. No cords or calf tenderness. No significant calf/ankle edema.  Discharge Diagnoses: Term Pregnancy-delivered  Discharge Information: Date: 06/16/2016 Activity: pelvic rest Diet: routine Medications: PNV and Ibuprofen Condition: stable Instructions: refer to practice specific booklet Discharge to: home Follow-up Information    [facility]. Schedule an appointment as soon as possible for a visit in 6 week(s).   Specialty:  Obstetrics and Gynecology Contact information: [address] [place] [address] [phone]          Newborn Data:   Nieasha, Mcaulay [[id number]]  Live born female  Birth Weight: 4 lb 6.9 oz (2010 g) APGAR: 8, 9   D[place], Mooring [[id number]]  Live born female  Birth Weight: 5 lb 1.3 oz (2305 g) APGAR: 8, 8   Donette Larry, [place] 06/16/2016, 7:55 AM

## 2016-06-16 NOTE — Lactation Note (Signed)
This note was copied from a baby's chart. Lactation Consultation Note  Patient Name: [name] [id number] Date: 06/16/2016 Reason for consult: Follow-up assessment  Mom was not interested in consult w/LC. She says she has no questions about breast care, pumping, etc.   After leaving room, I noted that Mom had been taking Tessalon Perles, 200mg  TID, which is an L4. I notified NICU IBCLC and her MBU RN. (My understanding is that Mom has not been pumping, however).   Mom also on Celexa 20mg  qd (L2). Mom has a history of taking Flexeril prn (L3), but that was not noted on her MAR during this in-patient stay.  Lurline Hare [facility] 06/16/2016, 10:55 AM

## 2016-06-16 NOTE — Discharge Instructions (Signed)

## 2016-06-18 ENCOUNTER — Encounter (HOSPITAL_COMMUNITY): Payer: Self-pay

## 2016-06-22 ENCOUNTER — Telehealth: Payer: Self-pay | Admitting: *Deleted

## 2016-06-22 NOTE — Telephone Encounter (Signed)
It seems quick but if no heavy lifting I suppose she can return to work if she desires

## 2016-06-23 NOTE — Telephone Encounter (Signed)
Pt informed per Dr.Eure can return to work but no heavy lifting. Pt verbalized understanding and work note left at front desk for pt to pick up.

## 2016-06-25 ENCOUNTER — Telehealth: Payer: Self-pay | Admitting: Obstetrics & Gynecology

## 2016-06-25 NOTE — Telephone Encounter (Signed)
Pt came by during lunch and would like for Chrystal to give her a call

## 2016-06-25 NOTE — Telephone Encounter (Signed)
Pt states she thinks she may be having postpartum depression, no having any thoughts of harming self or others. Appt scheduled with Cyril Mourning, NP for evaluation.

## 2016-06-30 ENCOUNTER — Encounter: Payer: Self-pay | Admitting: Adult Health

## 2016-06-30 ENCOUNTER — Ambulatory Visit (INDEPENDENT_AMBULATORY_CARE_PROVIDER_SITE_OTHER): Payer: Medicaid Other | Admitting: Adult Health

## 2016-06-30 VITALS — BP 168/92 | HR 66 | Ht 63.0 in | Wt 182.5 lb

## 2016-06-30 DIAGNOSIS — F418 Other specified anxiety disorders: Secondary | ICD-10-CM

## 2016-06-30 DIAGNOSIS — F53 Postpartum depression: Secondary | ICD-10-CM

## 2016-06-30 DIAGNOSIS — O99345 Other mental disorders complicating the puerperium: Secondary | ICD-10-CM

## 2016-06-30 DIAGNOSIS — O165 Unspecified maternal hypertension, complicating the puerperium: Secondary | ICD-10-CM | POA: Diagnosis not present

## 2016-06-30 HISTORY — DX: Other mental disorders complicating the puerperium: O99.345

## 2016-06-30 HISTORY — DX: Postpartum depression: F53.0

## 2016-06-30 MED ORDER — HYDROCHLOROTHIAZIDE 25 MG PO TABS: 25.0000 mg | ORAL_TABLET | Freq: Every day | ORAL | 0 refills | Status: DC

## 2016-06-30 MED ORDER — CITALOPRAM HYDROBROMIDE 40 MG PO TABS: 40.0000 mg | ORAL_TABLET | Freq: Every day | ORAL | 6 refills | Status: DC

## 2016-06-30 MED ORDER — ALPRAZOLAM 0.5 MG PO TABS: ORAL_TABLET | ORAL | 0 refills | Status: DC

## 2016-06-30 NOTE — Progress Notes (Signed)
Subjective:     Patient ID: [name], female   DOB: 08/07/1986, 30 y.o.   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 She denies and suicidal or homicidal ideations.  Review of Systems Patient denies any  hearing loss, fatigue, blurred vision, shortness of breath, chest pain, abdominal pain, problems with bowel movements, urination, or intercourse(not having sex). No joint pain, see HPI for positives. Reviewed past medical,surgical, social and family history. Reviewed medications and allergies.     Objective:   Physical Exam BP (!) 168/92   Pulse 66   Ht 5\' 3"  (1.6 m)   Wt 182 lb 8 oz (82.8 kg)   Breastfeeding? No   BMI 32.33 kg/m  Skin warm and dry. Lungs: clear to ausculation bilaterally. Cardiovascular: regular rate and rhythm.   Abdomen is soft and non tender, no RUQ pain or tenderness, has some swelling in feet and ankles, no pitting, reflexes 3+ brisk, no clonus, and her EPDS score was 24. Discussed with Dr Despina HiddenEure, will double her Celexa to 40 mg 1 daily and give hydrochlorothiazide 25 mg 1 daily and also give xanax 0.5 mg 1 bid prn and will check CBC and CMP today and see back in 1 week.She says she has to work and Dr Despina HiddenEure is aware, he said she called last week about it.    Assessment:     Postpartum depression History of depression and anxiety Postpartum hypertension      Plan:     Rx Celexa 40 mg #30 take 1 daily with 6 refills Rx hydrochlorothiazide 25 mg #30 take 1 daily  Rx xanax 0.5 mg #15 take 1 every 12 hours prn no refills Check CBC and CMP Return in 1 week for follow up and BP  check If moods change and feels worse go to ER and she agrees  Review handout on postpartum depression

## 2016-06-30 NOTE — Patient Instructions (Signed)
Postpartum Depression and Baby Blues °The postpartum period begins right after the birth of a baby. During this time, there is often a great amount of joy and excitement. It is also a time of many changes in the life of the parents. Regardless of how many times a mother gives birth, each child brings new challenges and dynamics to the family. It is not unusual to have feelings of excitement along with confusing shifts in moods, emotions, and thoughts. All mothers are at risk of developing postpartum depression or the "baby blues." These mood changes can occur right after giving birth, or they may occur many months after giving birth. The baby blues or postpartum depression can be mild or severe. Additionally, postpartum depression can go away rather quickly, or it can be a long-term condition.  °CAUSES °Raised hormone levels and the rapid drop in those levels are thought to be a main cause of postpartum depression and the baby blues. A number of hormones change during and after pregnancy. Estrogen and progesterone usually decrease right after the delivery of your baby. The levels of thyroid hormone and various cortisol steroids also rapidly drop. Other factors that play a role in these mood changes include major life events and genetics.  °RISK FACTORS °If you have any of the following risks for the baby blues or postpartum depression, know what symptoms to watch out for during the postpartum period. Risk factors that may increase the likelihood of getting the baby blues or postpartum depression include: °· Having a personal or family history of depression.   °· Having depression while being pregnant.   °· Having premenstrual mood issues or mood issues related to oral contraceptives. °· Having a lot of life stress.   °· Having marital conflict.   °· Lacking a social support network.   °· Having a baby with special needs.   °· Having health problems, such as diabetes.   °SIGNS AND SYMPTOMS °Symptoms of baby blues  include: °· Brief changes in mood, such as going from extreme happiness to sadness. °· Decreased concentration.   °· Difficulty sleeping.   °· Crying spells, tearfulness.   °· Irritability.   °· Anxiety.   °Symptoms of postpartum depression typically begin within the first month after giving birth. These symptoms include: °· Difficulty sleeping or excessive sleepiness.   °· Marked weight loss.   °· Agitation.   °· Feelings of worthlessness.   °· Lack of interest in activity or food.   °Postpartum psychosis is a very serious condition and can be dangerous. Fortunately, it is rare. Displaying any of the following symptoms is cause for immediate medical attention. Symptoms of postpartum psychosis include:  °· Hallucinations and delusions.   °· Bizarre or disorganized behavior.   °· Confusion or disorientation.   °DIAGNOSIS  °A diagnosis is made by an evaluation of your symptoms. There are no medical or lab tests that lead to a diagnosis, but there are various questionnaires that a health care provider may use to identify those with the baby blues, postpartum depression, or psychosis. Often, a screening tool called the Edinburgh Postnatal Depression Scale is used to diagnose depression in the postpartum period.  °TREATMENT °The baby blues usually goes away on its own in 1-2 weeks. Social support is often all that is needed. You will be encouraged to get adequate sleep and rest. Occasionally, you may be given medicines to help you sleep.  °Postpartum depression requires treatment because it can last several months or longer if it is not treated. Treatment may include individual or group therapy, medicine, or both to address any social, physiological, and psychological   factors that may play a role in the depression. Regular exercise, a healthy diet, rest, and social support may also be strongly recommended.  °Postpartum psychosis is more serious and needs treatment right away. Hospitalization is often needed. °HOME CARE  INSTRUCTIONS °· Get as much rest as you can. Nap when the baby sleeps.   °· Exercise regularly. Some women find yoga and walking to be beneficial.   °· Eat a balanced and nourishing diet.   °· Do little things that you enjoy. Have a cup of tea, take a bubble bath, read your favorite magazine, or listen to your favorite music. °· Avoid alcohol.   °· Ask for help with household chores, cooking, grocery shopping, or running errands as needed. Do not try to do everything.   °· Talk to people close to you about how you are feeling. Get support from your partner, family members, friends, or other new moms. °· Try to stay positive in how you think. Think about the things you are grateful for.   °· Do not spend a lot of time alone.   °· Only take over-the-counter or prescription medicine as directed by your health care provider. °· Keep all your postpartum appointments.   °· Let your health care provider know if you have any concerns.   °SEEK MEDICAL CARE IF: °You are having a reaction to or problems with your medicine. °SEEK IMMEDIATE MEDICAL CARE IF: °· You have suicidal feelings.   °· You think you may harm the baby or someone else. °MAKE SURE YOU: °· Understand these instructions. °· Will watch your condition. °· Will get help right away if you are not doing well or get worse. °  °This information is not intended to replace advice given to you by your health care provider. Make sure you discuss any questions you have with your health care provider. °  °Document Released: 08/13/2004 Document Revised: 11/14/2013 Document Reviewed: 08/21/2013 °Elsevier Interactive Patient Education ©2016 Elsevier Inc. ° °

## 2016-07-01 ENCOUNTER — Telehealth: Payer: Self-pay | Admitting: Adult Health

## 2016-07-01 LAB — COMPREHENSIVE METABOLIC PANEL
ALBUMIN: 3.9 g/dL (ref 3.5–5.5)
ALT: 25 IU/L (ref 0–32)
AST: 28 IU/L (ref 0–40)
Albumin/Globulin Ratio: 1.6 (ref 1.2–2.2)
Alkaline Phosphatase: 104 IU/L (ref 39–117)
BUN / CREAT RATIO: 10 (ref 9–23)
BUN: 6 mg/dL (ref 6–20)
Bilirubin Total: <0.2 mg/dL (ref 0.0–1.2)
CALCIUM: 8.7 mg/dL (ref 8.7–10.2)
CO2: 21 mmol/L (ref 18–29)
CREATININE: 0.6 mg/dL (ref 0.57–1.00)
Chloride: 104 mmol/L (ref 96–106)
GFR calc Af Amer: 139 mL/min/{1.73_m2} (ref >59)
GFR, EST NON AFRICAN AMERICAN: 121 mL/min/{1.73_m2} (ref >59)
GLOBULIN, TOTAL: 2.4 g/dL (ref 1.5–4.5)
Glucose: 86 mg/dL (ref 65–99)
Potassium: 4.1 mmol/L (ref 3.5–5.2)
SODIUM: 142 mmol/L (ref 134–144)
TOTAL PROTEIN: 6.3 g/dL (ref 6.0–8.5)

## 2016-07-01 LAB — CBC
HEMATOCRIT: 40.1 % (ref 34.0–46.6)
HEMOGLOBIN: 13.4 g/dL (ref 11.1–15.9)
MCH: 29.1 pg (ref 26.6–33.0)
MCHC: 33.4 g/dL (ref 31.5–35.7)
MCV: 87 fL (ref 79–97)
Platelets: 296 10*3/uL (ref 150–379)
RBC: 4.61 x10E6/uL (ref 3.77–5.28)
RDW: 14.5 % (ref 12.3–15.4)
WBC: 10 10*3/uL (ref 3.4–10.8)

## 2016-07-01 NOTE — Telephone Encounter (Signed)
Left message labs normal

## 2016-07-08 ENCOUNTER — Ambulatory Visit: Payer: Medicaid Other | Admitting: Adult Health

## 2016-07-21 ENCOUNTER — Ambulatory Visit: Payer: Medicaid Other | Admitting: Obstetrics & Gynecology

## 2016-07-23 ENCOUNTER — Ambulatory Visit (INDEPENDENT_AMBULATORY_CARE_PROVIDER_SITE_OTHER): Payer: Medicaid Other | Admitting: Advanced Practice Midwife

## 2016-07-23 ENCOUNTER — Encounter: Payer: Self-pay | Admitting: Advanced Practice Midwife

## 2016-07-23 NOTE — Progress Notes (Signed)
Paula RinneHeather M [name] is a 30 y.o. who presents for a postpartum visit. She is 5 weeks postpartum following a spontaneous vaginal delivery. I have fully reviewed the prenatal and intrapartum course. The deliveryat 35.4 gestational weeks d/t SROM, twins Anesthesia: epidural. Postpartum course has been complicated by some depression.  . Baby's course has been uneventful. Babies are feeding by bottle. Bleeding: had a period last week.. Bowel function is normal. Bladder function is normal. Patient is sexually active. Contraception method is condoms.  Last sex 1 weeks ago. Postpartum depression screening: 12 (was 24 3 weeks ago)_ :  Pt has hx depression/anxiety  She was told to double her celexa dose, and it has helped. Is working, has some help with babies.   .   Current Outpatient Prescriptions:  .  ALPRAZolam (XANAX) 0.5 MG tablet, Take 1 every 12 hours as needed, Disp: 15 tablet, Rfl: 0 .  citalopram (CELEXA) 40 MG tablet, Take 1 tablet (40 mg total) by mouth daily., Disp: 40 tablet, Rfl: 6 .  hydrochlorothiazide (HYDRODIURIL) 25 MG tablet, Take 1 tablet (25 mg total) by mouth daily., Disp: 30 tablet, Rfl: 0 .  ibuprofen (ADVIL,MOTRIN) 600 MG tablet, Take 1 tablet (600 mg total) by mouth every 6 (six) hours. (Patient taking differently: Take 600 mg by mouth daily. ), Disp: 30 tablet, Rfl: 0 .  omeprazole (PRILOSEC) 20 MG capsule, Take 20 mg by mouth 2 (two) times daily., Disp: , Rfl:  .  zolpidem (AMBIEN) 10 MG tablet, TK 1 T PO HS PRN FOR SLEEP, Disp: , Rfl: 3  Review of Systems   Constitutional: Negative for fever and chills Eyes: Negative for visual disturbances Respiratory: Negative for shortness of breath, dyspnea Cardiovascular: Negative for chest pain or palpitations  Gastrointestinal: Negative for vomiting, diarrhea and constipation Genitourinary: Negative for dysuria and urgency Musculoskeletal: Negative for back pain, joint pain, myalgias  Neurological: Negative for dizziness and  headaches   Objective:     Vitals:   07/23/16 1356  BP: 118/68  Pulse: 82   General:  alert, cooperative and no distress   Breasts:  negative  Lungs: clear to auscultation bilaterally  Heart:  regular rate and rhythm  Abdomen: Soft, nontender   Vulva:  normal  Vagina: normal vagina  Cervix:  closed  Corpus: Well involuted     Rectal Exam: No hemorrhoids        Assessment:    normal postpartum exam. PPD, iimproving Plan:    1. Contraception: IUD 2. Follow up in: 1 week (no sex until then) or as needed.

## 2016-07-30 ENCOUNTER — Ambulatory Visit (INDEPENDENT_AMBULATORY_CARE_PROVIDER_SITE_OTHER): Payer: Medicaid Other | Admitting: Obstetrics and Gynecology

## 2016-07-30 ENCOUNTER — Encounter: Payer: Self-pay | Admitting: Obstetrics and Gynecology

## 2016-07-30 DIAGNOSIS — Z30430 Encounter for insertion of intrauterine contraceptive device: Secondary | ICD-10-CM | POA: Diagnosis not present

## 2016-07-30 DIAGNOSIS — Z3202 Encounter for pregnancy test, result negative: Secondary | ICD-10-CM | POA: Diagnosis not present

## 2016-07-30 DIAGNOSIS — Z30014 Encounter for initial prescription of intrauterine contraceptive device: Secondary | ICD-10-CM

## 2016-07-30 LAB — POCT URINE PREGNANCY: PREG TEST UR: NEGATIVE

## 2016-07-30 NOTE — Progress Notes (Addendum)
Chief Complaint  Patient presents with   IUD insertion    consent signed    Paula RinneHeather M Herrod is a 30 y.o. [phone] here for Mirena IUD reinsertion. No GYN concerns. Pt is 6 weeks postpartum. Last pregnancy test today was negative.    IUD insertion  Patient identified, informed consent performed. Discussed risks of irregular bleeding, cramping, infection, malpositioning or misplacement of the IUD outside the uterus which may require further procedures. Time out was performed. Speculum placed in the vagina. Cervix visualized. Cleaned with Betadine x 2.  Uterus sounded to 9.5cm. Mirena IUD placed per manufacturer's recommendations. Positioned in place without difficulty. Strings trimmed to 2.5 cm. Tenaculum was removed, good hemostasis noted. Patient tolerated procedure well. Patient was given post-procedure instructions.  Patient was also asked to check IUD strings periodically and offered follow up in 4 weeks for IUD check.   By signing my name below, I, [name], attest that this documentation has been prepared under the direction and in the presence of Tilda BurrowJohn V Ferguson, MD . Electronically Signed: Freida Busmaniana Omoyeni, Scribe. 07/30/2016. 2:59 PM. I personally performed the services described in this documentation, which was SCRIBED in my presence. The recorded information has been reviewed and considered accurate. It has been edited as necessary during review. Tilda BurrowFERGUSON,JOHN V, MD

## 2016-07-30 NOTE — Progress Notes (Signed)
Patient ID: [name], female   DOB: 09/15/1986, 30 y.o.   MRN: [id number] Chief Complaint  Patient presents with  . IUD insertion    consent signed    Paula RinneHeather M [name] is a 30 y.o. [phone] here for Mirena IUD reinsertion. No GYN concerns. Pt is 6 weeks postpartum. Last pregnancy test today was negative.    IUD insertion  Patient identified, informed consent performed. Discussed risks of irregular bleeding, cramping, infection, malpositioning or misplacement of the IUD outside the uterus which may require further procedures. Time out was performed. Speculum placed in the vagina. Cervix visualized. Cleaned with Betadine x 2.  Uterus sounded to 9.5cm. Mirena IUD placed per manufacturer's recommendations. Positioned in place without difficulty. Strings trimmed to 2.5 cm. Tenaculum was removed, good hemostasis noted. Patient tolerated procedure well. Patient was given post-procedure instructions.  Patient was also asked to check IUD strings periodically and offered follow up in 4 weeks for IUD check.   By signing my name below, I, [name], attest that this documentation has been prepared under the direction and in the presence of Tilda BurrowJohn V Ervan Heber, MD . Electronically Signed: Freida Busmaniana Omoyeni, Scribe. 07/30/2016. 2:59 PM. I personally performed the services described in this documentation, which was SCRIBED in my presence. The recorded information has been reviewed and considered accurate. It has been edited as necessary during review. Tilda BurrowFERGUSON,Tyshawna Alarid V, MD

## 2016-07-30 NOTE — Patient Instructions (Signed)
iudLevonorgestrel intrauterine device (IUD) What is this medicine? LEVONORGESTREL IUD (LEE voe nor jes trel) is a contraceptive (birth control) device. The device is placed inside the uterus by a healthcare professional. It is used to prevent pregnancy and can also be used to treat heavy bleeding that occurs during your period. Depending on the device, it can be used for 3 to 5 years. This medicine may be used for other purposes; ask your health care provider or pharmacist if you have questions. What should I tell my health care provider before I take this medicine? They need to know if you have any of these conditions: -abnormal Pap smear -cancer of the breast, uterus, or cervix -diabetes -endometritis -genital or pelvic infection now or in the past -have more than one sexual partner or your partner has more than one partner -heart disease -history of an ectopic or tubal pregnancy -immune system problems -IUD in place -liver disease or tumor -problems with blood clots or take blood-thinners -use intravenous drugs -uterus of unusual shape -vaginal bleeding that has not been explained -an unusual or allergic reaction to levonorgestrel, other hormones, silicone, or polyethylene, medicines, foods, dyes, or preservatives -pregnant or trying to get pregnant -breast-feeding How should I use this medicine? This device is placed inside the uterus by a health care professional. Talk to your pediatrician regarding the use of this medicine in children. Special care may be needed. Overdosage: If you think you have taken too much of this medicine contact a poison control center or emergency room at once. NOTE: This medicine is only for you. Do not share this medicine with others. What if I miss a dose? This does not apply. What may interact with this medicine? Do not take this medicine with any of the following medications: -amprenavir -bosentan -fosamprenavir This medicine may also interact  with the following medications: -aprepitant -barbiturate medicines for inducing sleep or treating seizures -bexarotene -griseofulvin -medicines to treat seizures like carbamazepine, ethotoin, felbamate, oxcarbazepine, phenytoin, topiramate -modafinil -pioglitazone -rifabutin -rifampin -rifapentine -some medicines to treat HIV infection like atazanavir, indinavir, lopinavir, nelfinavir, tipranavir, ritonavir -St. Srihaan Mastrangelo's wort -warfarin This list may not describe all possible interactions. Give your health care provider a list of all the medicines, herbs, non-prescription drugs, or dietary supplements you use. Also tell them if you smoke, drink alcohol, or use illegal drugs. Some items may interact with your medicine. What should I watch for while using this medicine? Visit your doctor or health care professional for regular check ups. See your doctor if you or your partner has sexual contact with others, becomes HIV positive, or gets a sexual transmitted disease. This product does not protect you against HIV infection (AIDS) or other sexually transmitted diseases. You can check the placement of the IUD yourself by reaching up to the top of your vagina with clean fingers to feel the threads. Do not pull on the threads. It is a good habit to check placement after each menstrual period. Call your doctor right away if you feel more of the IUD than just the threads or if you cannot feel the threads at all. The IUD may come out by itself. You may become pregnant if the device comes out. If you notice that the IUD has come out use a backup birth control method like condoms and call your health care provider. Using tampons will not change the position of the IUD and are okay to use during your period. What side effects may I notice from receiving this medicine?  Side effects that you should report to your doctor or health care professional as soon as possible: -allergic reactions like skin rash, itching  or hives, swelling of the face, lips, or tongue -fever, flu-like symptoms -genital sores -high blood pressure -no menstrual period for 6 weeks during use -pain, swelling, warmth in the leg -pelvic pain or tenderness -severe or sudden headache -signs of pregnancy -stomach cramping -sudden shortness of breath -trouble with balance, talking, or walking -unusual vaginal bleeding, discharge -yellowing of the eyes or skin Side effects that usually do not require medical attention (report to your doctor or health care professional if they continue or are bothersome): -acne -breast pain -change in sex drive or performance -changes in weight -cramping, dizziness, or faintness while the device is being inserted -headache -irregular menstrual bleeding within first 3 to 6 months of use -nausea This list may not describe all possible side effects. Call your doctor for medical advice about side effects. You may report side effects to FDA at 1-800-FDA-1088. Where should I keep my medicine? This does not apply. NOTE: This sheet is a summary. It may not cover all possible information. If you have questions about this medicine, talk to your doctor, pharmacist, or health care provider.    2016, Elsevier/Gold Standard. (2011-12-10 13:54:04)

## 2016-08-21 ENCOUNTER — Telehealth: Payer: Self-pay | Admitting: Obstetrics and Gynecology

## 2016-08-21 NOTE — Telephone Encounter (Signed)
Pt states that she got a birthcontrol that is digging into her and it hurts, pt would like to speak with a nurse to see if it is normal. Pt states that she goes into [facility] at 11 am. Please contact pt

## 2016-08-24 NOTE — Telephone Encounter (Signed)
Pt c/o BTB with IUD, IUD inserted on 07/30/2016. Pt informed normal to have BTB with IUD could take up to 3-6 months before resolved. Pt states her bleeding has lightened up. Pt advised to call back as needed.

## 2016-08-27 ENCOUNTER — Encounter: Payer: Self-pay | Admitting: Advanced Practice Midwife

## 2016-08-27 ENCOUNTER — Ambulatory Visit (INDEPENDENT_AMBULATORY_CARE_PROVIDER_SITE_OTHER): Payer: Medicaid Other | Admitting: Advanced Practice Midwife

## 2016-08-27 VITALS — BP 140/88 | HR 72 | Wt 184.0 lb

## 2016-08-27 DIAGNOSIS — Z30431 Encounter for routine checking of intrauterine contraceptive device: Secondary | ICD-10-CM | POA: Diagnosis not present

## 2016-08-27 MED ORDER — SERTRALINE HCL 50 MG PO TABS: 50.0000 mg | ORAL_TABLET | Freq: Every day | ORAL | 4 refills | Status: DC

## 2016-08-27 MED ORDER — AZITHROMYCIN 250 MG PO TABS: 250.0000 mg | ORAL_TABLET | Freq: Every day | ORAL | 0 refills | Status: DC

## 2016-08-27 MED ORDER — OMEPRAZOLE 40 MG PO CPDR: 40.0000 mg | DELAYED_RELEASE_CAPSULE | Freq: Two times a day (BID) | ORAL | 3 refills | Status: DC

## 2016-08-27 MED ORDER — SERTRALINE HCL 25 MG PO TABS: 50.0000 mg | ORAL_TABLET | Freq: Every day | ORAL | Status: DC
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
## 2016-09-08 ENCOUNTER — Encounter: Payer: Medicaid Other | Admitting: Obstetrics & Gynecology

## 2016-09-15 ENCOUNTER — Telehealth: Payer: Self-pay | Admitting: Advanced Practice Midwife

## 2016-09-15 NOTE — Telephone Encounter (Signed)
Called patient to let her know [facility] phone number and to call back if she had further questions.

## 2016-09-15 NOTE — Telephone Encounter (Signed)
Patient called stating she is very irritable and mad for no reason. She was put on Zoloft at her last appointment and doesn't know if its from the meds or something else. The psychiatrist called with an appointment but she did not go. She states she does not have the phone number and doesn't know who or where the office is located. If you have that info please send. Please advise.

## 2016-09-15 NOTE — Telephone Encounter (Signed)
[facility]:  [phone]

## 2016-09-22 ENCOUNTER — Encounter: Payer: Medicaid Other | Admitting: Obstetrics & Gynecology

## 2016-09-24 ENCOUNTER — Ambulatory Visit: Payer: Medicaid Other | Admitting: Obstetrics & Gynecology

## 2016-09-25 ENCOUNTER — Encounter: Payer: Medicaid Other | Admitting: Obstetrics & Gynecology

## 2016-10-06 ENCOUNTER — Encounter: Payer: Medicaid Other | Admitting: Obstetrics & Gynecology

## 2016-10-13 ENCOUNTER — Encounter: Payer: Medicaid Other | Admitting: Obstetrics & Gynecology

## 2016-10-27 ENCOUNTER — Encounter: Payer: Medicaid Other | Admitting: Obstetrics & Gynecology

## 2016-11-20 ENCOUNTER — Emergency Department (HOSPITAL_COMMUNITY)
Admission: EM | Admit: 2016-11-20 | Discharge: 2016-11-21 | Disposition: A | Discharge status: Home / Self Care | Payer: Medicaid Other | Attending: Emergency Medicine | Admitting: Emergency Medicine

## 2016-11-20 ENCOUNTER — Encounter (HOSPITAL_COMMUNITY): Payer: Self-pay | Admitting: Emergency Medicine

## 2016-11-20 ENCOUNTER — Emergency Department (HOSPITAL_COMMUNITY): Payer: Medicaid Other

## 2016-11-20 DIAGNOSIS — Z791 Long term (current) use of non-steroidal anti-inflammatories (NSAID): Secondary | ICD-10-CM | POA: Insufficient documentation

## 2016-11-20 DIAGNOSIS — R509 Fever, unspecified: Secondary | ICD-10-CM | POA: Diagnosis not present

## 2016-11-20 DIAGNOSIS — J029 Acute pharyngitis, unspecified: Secondary | ICD-10-CM | POA: Insufficient documentation

## 2016-11-20 DIAGNOSIS — H9203 Otalgia, bilateral: Secondary | ICD-10-CM | POA: Insufficient documentation

## 2016-11-20 DIAGNOSIS — R05 Cough: Secondary | ICD-10-CM | POA: Diagnosis not present

## 2016-11-20 DIAGNOSIS — F909 Attention-deficit hyperactivity disorder, unspecified type: Secondary | ICD-10-CM | POA: Insufficient documentation

## 2016-11-20 DIAGNOSIS — F17210 Nicotine dependence, cigarettes, uncomplicated: Secondary | ICD-10-CM | POA: Diagnosis not present

## 2016-11-20 DIAGNOSIS — R0981 Nasal congestion: Secondary | ICD-10-CM | POA: Diagnosis present

## 2016-11-20 DIAGNOSIS — R6889 Other general symptoms and signs: Secondary | ICD-10-CM

## 2016-11-20 MED ORDER — IBUPROFEN 800 MG PO TABS
800.0000 mg | ORAL_TABLET | Freq: Once | ORAL | Status: AC
  Administered 2016-11-20: 800 mg via ORAL
  Filled 2016-11-20: qty 1

## 2016-11-20 MED ORDER — ONDANSETRON 4 MG PO TBDP
4.0000 mg | ORAL_TABLET | Freq: Once | ORAL | Status: AC
  Administered 2016-11-20: 4 mg via ORAL
  Filled 2016-11-20: qty 1

## 2016-11-20 MED ORDER — GUAIFENESIN-CODEINE 100-10 MG/5ML PO SOLN
5.0000 mL | Freq: Once | ORAL | Status: AC
  Administered 2016-11-20: 5 mL via ORAL
  Filled 2016-11-20: qty 5

## 2016-11-20 NOTE — ED Triage Notes (Signed)
Pt states that she has had a cough and congestion since yesterday.  Pt states she has had chills today

## 2016-11-20 NOTE — ED Notes (Signed)
Patient transported to X-ray 
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
## 2016-11-20 NOTE — ED Notes (Signed)
ED Provider at bedside. 

## 2016-11-21 MED ORDER — GUAIFENESIN-CODEINE 100-10 MG/5ML PO SOLN: 10.0000 mL | Freq: Four times a day (QID) | ORAL | 0 refills | Status: DC | PRN

## 2016-11-21 MED ORDER — ONDANSETRON 4 MG PO TBDP: 4.0000 mg | ORAL_TABLET | Freq: Three times a day (TID) | ORAL | 0 refills | Status: DC | PRN

## 2016-11-21 NOTE — ED Notes (Signed)
Pt returned from xray

## 2016-11-21 NOTE — Discharge Instructions (Signed)
Please alternate between Tylenol 1000 mg every 6 hours as needed for fever and pain and ibuprofen 800 mg every 8 hours as needed for fever and pain. You may use guaifenesin with codeine to help with your cough. Please note that this does have a narcotic in it which can make you drowsy and therefore you should not drive, operate machinery, make important decisions or drink alcohol when taking this medication. Codeine may make you nauseated. We are discharging you with prescription of Zofran to take if you do become nauseous.  Your chest x-ray showed no pneumonia. You do not need to be on antibiotics as this is a viral illness causing your symptoms, likely the flu.

## 2016-11-24 ENCOUNTER — Other Ambulatory Visit: Payer: Self-pay | Admitting: Advanced Practice Midwife

## 2016-12-23 ENCOUNTER — Ambulatory Visit: Payer: Medicaid Other | Admitting: Obstetrics and Gynecology

## 2017-01-04 ENCOUNTER — Ambulatory Visit: Payer: Medicaid Other | Admitting: Obstetrics & Gynecology

## 2017-03-04 ENCOUNTER — Ambulatory Visit: Payer: Medicaid Other | Admitting: Adult Health

## 2017-03-15 ENCOUNTER — Encounter (HOSPITAL_COMMUNITY): Payer: Self-pay | Admitting: *Deleted

## 2017-03-15 ENCOUNTER — Emergency Department (HOSPITAL_COMMUNITY)
Admission: EM | Admit: 2017-03-15 | Discharge: 2017-03-16 | Disposition: A | Discharge status: Home / Self Care | Payer: Medicaid Other | Attending: Emergency Medicine | Admitting: Emergency Medicine

## 2017-03-15 DIAGNOSIS — J329 Chronic sinusitis, unspecified: Secondary | ICD-10-CM | POA: Insufficient documentation

## 2017-03-15 DIAGNOSIS — R110 Nausea: Secondary | ICD-10-CM | POA: Insufficient documentation

## 2017-03-15 DIAGNOSIS — F17210 Nicotine dependence, cigarettes, uncomplicated: Secondary | ICD-10-CM | POA: Insufficient documentation

## 2017-03-15 DIAGNOSIS — F909 Attention-deficit hyperactivity disorder, unspecified type: Secondary | ICD-10-CM | POA: Insufficient documentation

## 2017-03-15 DIAGNOSIS — R197 Diarrhea, unspecified: Secondary | ICD-10-CM | POA: Insufficient documentation

## 2017-03-15 DIAGNOSIS — Z79899 Other long term (current) drug therapy: Secondary | ICD-10-CM | POA: Insufficient documentation

## 2017-03-15 DIAGNOSIS — J069 Acute upper respiratory infection, unspecified: Secondary | ICD-10-CM | POA: Insufficient documentation

## 2017-03-15 NOTE — ED Triage Notes (Signed)
Pt c/o cough x 1 week; pt states she has white sputum with odor and c/o pain to left side of throat

## 2017-03-16 MED ORDER — CLINDAMYCIN HCL 150 MG PO CAPS
300.0000 mg | ORAL_CAPSULE | Freq: Once | ORAL | Status: AC
  Administered 2017-03-16: 300 mg via ORAL
  Filled 2017-03-16: qty 2

## 2017-03-16 MED ORDER — CLINDAMYCIN HCL 150 MG PO CAPS
ORAL_CAPSULE | ORAL | 0 refills | Status: DC
Start: 2017-03-16 — End: 2018-04-08

## 2017-03-16 MED ORDER — ONDANSETRON HCL 4 MG PO TABS: 4.0000 mg | ORAL_TABLET | Freq: Four times a day (QID) | ORAL | 0 refills | Status: DC

## 2017-03-16 MED ORDER — IBUPROFEN 600 MG PO TABS: 600.0000 mg | ORAL_TABLET | Freq: Four times a day (QID) | ORAL | 0 refills | Status: DC

## 2017-03-16 MED ORDER — IBUPROFEN 800 MG PO TABS
800.0000 mg | ORAL_TABLET | Freq: Once | ORAL | Status: AC
  Administered 2017-03-16: 800 mg via ORAL
  Filled 2017-03-16: qty 1

## 2017-03-16 MED ORDER — ONDANSETRON HCL 4 MG PO TABS
4.0000 mg | ORAL_TABLET | Freq: Once | ORAL | Status: AC
  Administered 2017-03-16: 4 mg via ORAL
  Filled 2017-03-16: qty 1

## 2017-03-16 NOTE — Discharge Instructions (Signed)
Please increase water and fluids. Use clindamycin 2 times daily with food. Use ibuprofen for aching. Use zofran for nausea. See your Medicaid Access MD or return to the ED if any changes or problem.
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
## 2017-04-05 ENCOUNTER — Other Ambulatory Visit: Payer: Self-pay | Admitting: Advanced Practice Midwife

## 2017-04-07 ENCOUNTER — Telehealth: Payer: Self-pay | Admitting: Advanced Practice Midwife

## 2017-04-07 MED ORDER — SERTRALINE HCL 50 MG PO TABS: 50.0000 mg | ORAL_TABLET | Freq: Every day | ORAL | 0 refills | Status: DC

## 2017-04-07 NOTE — Telephone Encounter (Signed)
Pt called the office stating that her pharmacy as sent over a request for refill of her medication [place] to [place]. Pt also states that she called this morning and someone told her that fran is not in the office and wont be in for 2 weeks. Pt would like to know if someone else could refill her medication. Please contact pt

## 2017-04-07 NOTE — Telephone Encounter (Signed)
Called and informed patient that prescription was refilled for 1 month and needed to see MD for colpo. States she lost her insurance and can't afford to come at this time. States she will call and schedule when she gets insured.

## 2017-06-07 ENCOUNTER — Other Ambulatory Visit: Payer: Self-pay | Admitting: Advanced Practice Midwife

## 2017-07-08 ENCOUNTER — Other Ambulatory Visit: Payer: Self-pay | Admitting: Advanced Practice Midwife

## 2017-08-04 ENCOUNTER — Telehealth: Payer: Self-pay | Admitting: Obstetrics & Gynecology

## 2017-08-04 NOTE — Telephone Encounter (Signed)
Left message x 1. JSY 

## 2017-08-04 NOTE — Telephone Encounter (Signed)
No answer @ 12:22 pm. JSY 

## 2017-08-05 NOTE — Telephone Encounter (Addendum)
Spoke with pt. Pt wants depression med increased. Pt states [name] started her on depression med. Pt states she is not suicidal.  I advised she would need to be seen since it's been a while since she was seen. Pt voiced understanding. Call was transferred to front desk for appt. JSY

## 2017-08-06 ENCOUNTER — Other Ambulatory Visit: Payer: Self-pay | Admitting: Advanced Practice Midwife

## 2017-08-11 ENCOUNTER — Ambulatory Visit: Payer: Self-pay | Admitting: Advanced Practice Midwife

## 2017-08-11 ENCOUNTER — Encounter: Payer: Self-pay | Admitting: Advanced Practice Midwife

## 2017-08-11 ENCOUNTER — Telehealth: Payer: Self-pay | Admitting: Advanced Practice Midwife

## 2017-08-11 NOTE — Telephone Encounter (Signed)
Patient called stating she can not afford her self pay visit that she had for today, Pt states that she would like for [name] to sent her refills to the pharmacy of her anxiety and depression medication. Please contact pt

## 2017-08-11 NOTE — Telephone Encounter (Signed)
Informed patient to check mychart message.

## 2017-08-11 NOTE — Telephone Encounter (Signed)
Pt advised to go to [facility] for future care/refills.  Refilled X 3 to give pt time to seek care

## 2017-09-27 ENCOUNTER — Encounter: Payer: Self-pay | Admitting: Obstetrics and Gynecology

## 2017-12-15 ENCOUNTER — Other Ambulatory Visit: Payer: Self-pay | Admitting: Advanced Practice Midwife

## 2017-12-16 ENCOUNTER — Other Ambulatory Visit: Payer: Self-pay | Admitting: Adult Health

## 2017-12-16 ENCOUNTER — Telehealth: Payer: Self-pay | Admitting: Adult Health

## 2017-12-16 MED ORDER — SERTRALINE HCL 50 MG PO TABS: 50.0000 mg | ORAL_TABLET | Freq: Every day | ORAL | 0 refills | Status: DC

## 2017-12-16 NOTE — Telephone Encounter (Signed)
Patient states she has not taken her Zoloft since Tuesday and is out of her medication. STates she has not had insurance and now has family planning medicaid. She is in a panic because she needs the medication. Informed patient that Drenda [place] sent her a message last September regarding [facility]. States she has not gone but will go. All of the contact information for [facility] given to patient and states she will go Wednesday. Victorino DikeJennifer to send in 1 refill.

## 2017-12-16 NOTE — Progress Notes (Signed)
Pt called and Tish spoke with her, she is out of Zoloft and needs refill, she has not been seen since 2017, and [facility] will refill zoloft 50 mg 1 time and [facility],Tish gave her directions

## 2017-12-16 NOTE — Telephone Encounter (Signed)
Informed [name] that I cannot discuss any information regarding this patient with her since we do not have a DPR on file. Advised patient needed to call us.

## 2017-12-16 NOTE — Telephone Encounter (Signed)
LMOVM to return call.

## 2017-12-27 ENCOUNTER — Ambulatory Visit: Payer: Self-pay | Admitting: Adult Health

## 2018-01-06 ENCOUNTER — Ambulatory Visit: Payer: Self-pay | Admitting: Adult Health

## 2018-01-14 ENCOUNTER — Telehealth: Payer: Self-pay | Admitting: *Deleted

## 2018-01-14 NOTE — Telephone Encounter (Signed)
LMOVM letting patient know Victorino DikeJennifer was out of the office today and would be back Monday.

## 2018-01-20 ENCOUNTER — Telehealth: Payer: Self-pay | Admitting: Adult Health

## 2018-01-20 MED ORDER — SERTRALINE HCL 50 MG PO TABS: 50.0000 mg | ORAL_TABLET | Freq: Every day | ORAL | 0 refills | Status: DC

## 2018-01-20 NOTE — Telephone Encounter (Signed)
Left message will refill zoloft  x1

## 2018-02-17 ENCOUNTER — Other Ambulatory Visit: Payer: Self-pay | Admitting: Adult Health

## 2018-04-08 ENCOUNTER — Other Ambulatory Visit: Payer: Self-pay

## 2018-04-08 ENCOUNTER — Emergency Department (HOSPITAL_COMMUNITY)
Admission: EM | Admit: 2018-04-08 | Discharge: 2018-04-08 | Disposition: A | Discharge status: Home / Self Care | Payer: Self-pay | Attending: Emergency Medicine | Admitting: Emergency Medicine

## 2018-04-08 ENCOUNTER — Encounter (HOSPITAL_COMMUNITY): Payer: Self-pay | Admitting: Emergency Medicine

## 2018-04-08 DIAGNOSIS — R319 Hematuria, unspecified: Secondary | ICD-10-CM | POA: Insufficient documentation

## 2018-04-08 DIAGNOSIS — F17210 Nicotine dependence, cigarettes, uncomplicated: Secondary | ICD-10-CM | POA: Insufficient documentation

## 2018-04-08 DIAGNOSIS — N390 Urinary tract infection, site not specified: Secondary | ICD-10-CM | POA: Insufficient documentation

## 2018-04-08 DIAGNOSIS — F909 Attention-deficit hyperactivity disorder, unspecified type: Secondary | ICD-10-CM | POA: Insufficient documentation

## 2018-04-08 DIAGNOSIS — F419 Anxiety disorder, unspecified: Secondary | ICD-10-CM | POA: Insufficient documentation

## 2018-04-08 DIAGNOSIS — F319 Bipolar disorder, unspecified: Secondary | ICD-10-CM | POA: Insufficient documentation

## 2018-04-08 DIAGNOSIS — Z79899 Other long term (current) drug therapy: Secondary | ICD-10-CM | POA: Insufficient documentation

## 2018-04-08 LAB — URINALYSIS, ROUTINE W REFLEX MICROSCOPIC
BILIRUBIN URINE: NEGATIVE
Glucose, UA: NEGATIVE mg/dL
Ketones, ur: NEGATIVE mg/dL
Nitrite: NEGATIVE
Protein, ur: NEGATIVE mg/dL
SPECIFIC GRAVITY, URINE: 1.011 (ref 1.005–1.030)
pH: 5 (ref 5.0–8.0)

## 2018-04-08 LAB — PREGNANCY, URINE: Preg Test, Ur: NEGATIVE

## 2018-04-08 MED ORDER — NITROFURANTOIN MONOHYD MACRO 100 MG PO CAPS: 100.0000 mg | ORAL_CAPSULE | Freq: Two times a day (BID) | ORAL | 0 refills | Status: DC

## 2018-04-08 NOTE — Discharge Instructions (Addendum)
There was no clear cause today for the blood in the urine.  There are some signs of infection in the urine.  We are doing a urine culture to clarify whether or not there is an infection.  That will be available in about 2 days.  In the meantime we are starting an antibiotic to help in case there is a urinary tract infection.  Additionally to help your discomfort, try drinking 1 to 2 L of water each day, and taking Tylenol or Motrin for pain.  Return here, if needed, for problems.

## 2018-04-08 NOTE — ED Triage Notes (Signed)
Urinating blood since Sunday
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
## 2018-04-10 LAB — URINE CULTURE

## 2018-06-07 ENCOUNTER — Other Ambulatory Visit: Payer: Self-pay

## 2018-06-07 ENCOUNTER — Emergency Department (HOSPITAL_COMMUNITY)
Admission: EM | Admit: 2018-06-07 | Discharge: 2018-06-07 | Disposition: A | Discharge status: Home / Self Care | Payer: Self-pay | Attending: Emergency Medicine | Admitting: Emergency Medicine

## 2018-06-07 ENCOUNTER — Encounter (HOSPITAL_COMMUNITY): Payer: Self-pay | Admitting: Emergency Medicine

## 2018-06-07 DIAGNOSIS — M545 Low back pain: Secondary | ICD-10-CM | POA: Insufficient documentation

## 2018-06-07 DIAGNOSIS — Z5321 Procedure and treatment not carried out due to patient leaving prior to being seen by health care provider: Secondary | ICD-10-CM | POA: Insufficient documentation

## 2018-06-07 LAB — BASIC METABOLIC PANEL
Anion gap: 5 (ref 5–15)
BUN: 9 mg/dL (ref 6–20)
CHLORIDE: 106 mmol/L (ref 98–111)
CO2: 28 mmol/L (ref 22–32)
Calcium: 8.7 mg/dL — ABNORMAL LOW (ref 8.9–10.3)
Creatinine, Ser: 0.75 mg/dL (ref 0.44–1.00)
GFR calc Af Amer: >60 mL/min (ref >60)
GLUCOSE: 100 mg/dL — AB (ref 70–99)
POTASSIUM: 3.5 mmol/L (ref 3.5–5.1)
Sodium: 139 mmol/L (ref 135–145)

## 2018-06-07 LAB — CBC
HCT: 40.1 % (ref 36.0–46.0)
HEMOGLOBIN: 13.3 g/dL (ref 12.0–15.0)
MCH: 31.7 pg (ref 26.0–34.0)
MCHC: 33.2 g/dL (ref 30.0–36.0)
MCV: 95.7 fL (ref 78.0–100.0)
PLATELETS: 196 10*3/uL (ref 150–400)
RBC: 4.19 MIL/uL (ref 3.87–5.11)
RDW: 13 % (ref 11.5–15.5)
WBC: 11.1 10*3/uL — ABNORMAL HIGH (ref 4.0–10.5)

## 2018-06-07 LAB — URINALYSIS, ROUTINE W REFLEX MICROSCOPIC
GLUCOSE, UA: NEGATIVE mg/dL
Hgb urine dipstick: NEGATIVE
KETONES UR: NEGATIVE mg/dL
LEUKOCYTES UA: NEGATIVE
Nitrite: NEGATIVE
PH: 5 (ref 5.0–8.0)
Protein, ur: NEGATIVE mg/dL
SPECIFIC GRAVITY, URINE: 1.031 — AB (ref 1.005–1.030)

## 2018-06-07 NOTE — ED Notes (Signed)
Registration reported to RN that pt left waiting room prior to being brought to ED room.

## 2018-06-07 NOTE — ED Triage Notes (Signed)
Patient complaining of right flank pain and burning with urination x 5 days.

## 2019-01-20 ENCOUNTER — Encounter (HOSPITAL_COMMUNITY): Payer: Self-pay | Admitting: Emergency Medicine

## 2019-01-20 ENCOUNTER — Other Ambulatory Visit: Payer: Self-pay

## 2019-01-20 ENCOUNTER — Emergency Department (HOSPITAL_COMMUNITY)
Admission: EM | Admit: 2019-01-20 | Discharge: 2019-01-20 | Disposition: A | Discharge status: Home / Self Care | Payer: Self-pay | Attending: Emergency Medicine | Admitting: Emergency Medicine

## 2019-01-20 DIAGNOSIS — F17210 Nicotine dependence, cigarettes, uncomplicated: Secondary | ICD-10-CM | POA: Insufficient documentation

## 2019-01-20 DIAGNOSIS — N390 Urinary tract infection, site not specified: Secondary | ICD-10-CM | POA: Insufficient documentation

## 2019-01-20 LAB — WET PREP, GENITAL
CLUE CELLS WET PREP: NONE SEEN
Sperm: NONE SEEN
TRICH WET PREP: NONE SEEN
YEAST WET PREP: NONE SEEN

## 2019-01-20 LAB — URINALYSIS, ROUTINE W REFLEX MICROSCOPIC
Bilirubin Urine: NEGATIVE
Glucose, UA: NEGATIVE mg/dL
KETONES UR: NEGATIVE mg/dL
Nitrite: NEGATIVE
Protein, ur: NEGATIVE mg/dL
Specific Gravity, Urine: 1.006 (ref 1.005–1.030)
pH: 6 (ref 5.0–8.0)

## 2019-01-20 MED ORDER — NITROFURANTOIN MONOHYD MACRO 100 MG PO CAPS: 100.0000 mg | ORAL_CAPSULE | Freq: Two times a day (BID) | ORAL | 0 refills | Status: DC
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
## 2019-01-20 NOTE — ED Notes (Signed)
Patient left without signing or waiting for discharge paperwork. Dr. Effie Shy informed.

## 2019-01-20 NOTE — ED Triage Notes (Signed)
Patient states vaginal itch x 1 week. She states she thought she had a uti. She took some of her sister antibiotic but only had enough for 3 days. She now states pain while voiding that started yesterday. She also states smell from urine.

## 2019-01-20 NOTE — Discharge Instructions (Signed)
Make sure that you drink plenty of fluids, especially water.  Use ibuprofen or Tylenol for pain or fever.  Follow-up with the doctor of your choice if not better in 3 to 4 days.

## 2019-01-20 NOTE — ED Notes (Signed)
Patient states she needs to leave for work soon. I explained that we are waiting for lab results.

## 2019-01-21 LAB — RPR: RPR Ser Ql: NONREACTIVE

## 2019-01-21 LAB — HIV ANTIBODY (ROUTINE TESTING W REFLEX): HIV Screen 4th Generation wRfx: NONREACTIVE

## 2019-01-23 LAB — GC/CHLAMYDIA PROBE AMP (~~LOC~~) NOT AT ARMC
Chlamydia: POSITIVE — AB
NEISSERIA GONORRHEA: NEGATIVE

## 2019-01-25 ENCOUNTER — Telehealth: Payer: Self-pay | Admitting: Medical

## 2019-01-25 DIAGNOSIS — A749 Chlamydial infection, unspecified: Secondary | ICD-10-CM

## 2019-01-25 MED ORDER — AZITHROMYCIN 250 MG PO TABS: 1000.0000 mg | ORAL_TABLET | Freq: Once | ORAL | 0 refills | Status: AC

## 2019-01-25 NOTE — Telephone Encounter (Signed)
-----   Message from Kathe Becton, RN sent at 01/25/2019 10:04 AM EST ----- This patient tested positive for :  Chlamydia  She "is allergic to  PCN,.Sulfa, Iohexol"  I have informed the patient of her results and confirmed her pharmacy is correct in her chart. Please send Rx.   Thank you,   Kathe Becton, RN   Results faxed to [facility] Department.

## 2019-01-25 NOTE — Telephone Encounter (Signed)
[name] tested positive for  Chlamydia. Patient was called by RN and allergies and pharmacy confirmed. Rx sent to pharmacy of choice.   Marny Lowenstein, PA-C 01/25/2019 1:13 PM

## 2019-04-12 ENCOUNTER — Other Ambulatory Visit: Payer: Self-pay

## 2019-04-12 ENCOUNTER — Emergency Department (HOSPITAL_COMMUNITY): Payer: Medicaid Other

## 2019-04-12 ENCOUNTER — Encounter (HOSPITAL_COMMUNITY): Payer: Self-pay | Admitting: Emergency Medicine

## 2019-04-12 ENCOUNTER — Emergency Department (HOSPITAL_COMMUNITY)
Admission: EM | Admit: 2019-04-12 | Discharge: 2019-04-12 | Disposition: A | Discharge status: Home / Self Care | Payer: Medicaid Other | Attending: Emergency Medicine | Admitting: Emergency Medicine

## 2019-04-12 DIAGNOSIS — F17210 Nicotine dependence, cigarettes, uncomplicated: Secondary | ICD-10-CM | POA: Diagnosis not present

## 2019-04-12 DIAGNOSIS — R072 Precordial pain: Secondary | ICD-10-CM

## 2019-04-12 DIAGNOSIS — Z79899 Other long term (current) drug therapy: Secondary | ICD-10-CM | POA: Diagnosis not present

## 2019-04-12 LAB — BASIC METABOLIC PANEL
Anion gap: 8 (ref 5–15)
BUN: 8 mg/dL (ref 6–20)
CO2: 27 mmol/L (ref 22–32)
Calcium: 8.9 mg/dL (ref 8.9–10.3)
Chloride: 103 mmol/L (ref 98–111)
Creatinine, Ser: 0.55 mg/dL (ref 0.44–1.00)
GFR calc Af Amer: >60 mL/min (ref >60)
GFR calc non Af Amer: >60 mL/min (ref >60)
Glucose, Bld: 104 mg/dL — ABNORMAL HIGH (ref 70–99)
Potassium: 3.5 mmol/L (ref 3.5–5.1)
Sodium: 138 mmol/L (ref 135–145)

## 2019-04-12 LAB — CBC
HCT: 40.3 % (ref 36.0–46.0)
Hemoglobin: 13.5 g/dL (ref 12.0–15.0)
MCH: 31.4 pg (ref 26.0–34.0)
MCHC: 33.5 g/dL (ref 30.0–36.0)
MCV: 93.7 fL (ref 80.0–100.0)
Platelets: 211 10*3/uL (ref 150–400)
RBC: 4.3 MIL/uL (ref 3.87–5.11)
RDW: 12.3 % (ref 11.5–15.5)
WBC: 11.9 10*3/uL — ABNORMAL HIGH (ref 4.0–10.5)
nRBC: 0 % (ref 0.0–0.2)

## 2019-04-12 LAB — TROPONIN I: Troponin I: <0.03 ng/mL (ref <0.03)

## 2019-04-12 LAB — D-DIMER, QUANTITATIVE: D-Dimer, Quant: <0.27 ug/mL-FEU (ref 0.00–0.50)

## 2019-04-12 LAB — POC URINE PREG, ED: Preg Test, Ur: NEGATIVE

## 2019-04-12 NOTE — ED Triage Notes (Signed)
Pt c/o center, intermittent chest pain since yesterday, pt also c/o bilateral arm and hand pain/numbness, pt reports activity seems to intensify the pain some

## 2019-04-12 NOTE — Discharge Instructions (Signed)
Work-up for the chest pain and without any acute findings.  Also screening test for blood clots in the lungs was negative.  Pregnancy test was negative labs without significant abnormality.  Recommend following up with cardiology call and make an appointment.  Return for any new or worse symptoms in relation to how things have been for the last 24 hours.
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
## 2019-06-14 ENCOUNTER — Emergency Department (HOSPITAL_COMMUNITY)
Admission: EM | Admit: 2019-06-14 | Discharge: 2019-06-15 | Disposition: A | Discharge status: Home / Self Care | Payer: Medicaid Other | Attending: Emergency Medicine | Admitting: Emergency Medicine

## 2019-06-14 ENCOUNTER — Emergency Department (HOSPITAL_COMMUNITY): Payer: Medicaid Other

## 2019-06-14 ENCOUNTER — Other Ambulatory Visit: Payer: Self-pay

## 2019-06-14 ENCOUNTER — Encounter (HOSPITAL_COMMUNITY): Payer: Self-pay | Admitting: Emergency Medicine

## 2019-06-14 DIAGNOSIS — R502 Drug induced fever: Secondary | ICD-10-CM | POA: Insufficient documentation

## 2019-06-14 DIAGNOSIS — R05 Cough: Secondary | ICD-10-CM | POA: Insufficient documentation

## 2019-06-14 DIAGNOSIS — J020 Streptococcal pharyngitis: Secondary | ICD-10-CM | POA: Insufficient documentation

## 2019-06-14 MED ORDER — ACETAMINOPHEN 325 MG PO TABS
650.0000 mg | ORAL_TABLET | Freq: Once | ORAL | Status: AC
  Administered 2019-06-14: 650 mg via ORAL
  Filled 2019-06-14: qty 2
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
## 2019-06-14 NOTE — ED Triage Notes (Signed)
Pt C/O sore throat that began yesterday. Pt also C/O body aches and fever today. Denies cough.

## 2019-06-15 LAB — GROUP A STREP BY PCR: Group A Strep by PCR: DETECTED — AB

## 2019-06-15 MED ORDER — AZITHROMYCIN 250 MG PO TABS: 250.0000 mg | ORAL_TABLET | Freq: Every day | ORAL | 0 refills | Status: DC

## 2019-06-15 MED ORDER — AZITHROMYCIN 250 MG PO TABS
500.0000 mg | ORAL_TABLET | Freq: Once | ORAL | Status: AC
  Administered 2019-06-15: 01:00:00 500 mg via ORAL
  Filled 2019-06-15: qty 2

## 2019-06-15 NOTE — Discharge Instructions (Signed)
Take your next dose of Zithromax tomorrow evening as you have received today's dose here.  Rest make sure you are drinking plenty of fluids.  I recommend Tylenol or Motrin for fever reduction.  Motrin especially will also help with sore throat pain.

## 2019-07-14 ENCOUNTER — Encounter (HOSPITAL_COMMUNITY): Payer: Self-pay | Admitting: Emergency Medicine

## 2019-07-14 ENCOUNTER — Emergency Department (HOSPITAL_COMMUNITY)
Admission: EM | Admit: 2019-07-14 | Discharge: 2019-07-14 | Disposition: A | Discharge status: Home / Self Care | Payer: Medicaid Other | Attending: Emergency Medicine | Admitting: Emergency Medicine

## 2019-07-14 ENCOUNTER — Other Ambulatory Visit: Payer: Self-pay

## 2019-07-14 DIAGNOSIS — L259 Unspecified contact dermatitis, unspecified cause: Secondary | ICD-10-CM | POA: Insufficient documentation

## 2019-07-14 DIAGNOSIS — R197 Diarrhea, unspecified: Secondary | ICD-10-CM | POA: Insufficient documentation

## 2019-07-14 DIAGNOSIS — F17210 Nicotine dependence, cigarettes, uncomplicated: Secondary | ICD-10-CM | POA: Insufficient documentation

## 2019-07-14 DIAGNOSIS — Z79899 Other long term (current) drug therapy: Secondary | ICD-10-CM | POA: Insufficient documentation

## 2019-07-14 DIAGNOSIS — K921 Melena: Secondary | ICD-10-CM | POA: Insufficient documentation

## 2019-07-14 DIAGNOSIS — R1110 Vomiting, unspecified: Secondary | ICD-10-CM

## 2019-07-14 DIAGNOSIS — R112 Nausea with vomiting, unspecified: Secondary | ICD-10-CM | POA: Insufficient documentation

## 2019-07-14 LAB — COMPREHENSIVE METABOLIC PANEL
ALT: 18 U/L (ref 0–44)
AST: 22 U/L (ref 15–41)
Albumin: 3.9 g/dL (ref 3.5–5.0)
Alkaline Phosphatase: 74 U/L (ref 38–126)
Anion gap: 11 (ref 5–15)
BUN: <5 mg/dL — ABNORMAL LOW (ref 6–20)
CO2: 26 mmol/L (ref 22–32)
Calcium: 9.2 mg/dL (ref 8.9–10.3)
Chloride: 100 mmol/L (ref 98–111)
Creatinine, Ser: 0.68 mg/dL (ref 0.44–1.00)
GFR calc Af Amer: >60 mL/min (ref >60)
GFR calc non Af Amer: >60 mL/min (ref >60)
Glucose, Bld: 94 mg/dL (ref 70–99)
Potassium: 3.1 mmol/L — ABNORMAL LOW (ref 3.5–5.1)
Sodium: 137 mmol/L (ref 135–145)
Total Bilirubin: 0.3 mg/dL (ref 0.3–1.2)
Total Protein: 7.1 g/dL (ref 6.5–8.1)

## 2019-07-14 LAB — CBC WITH DIFFERENTIAL/PLATELET
Abs Immature Granulocytes: 0.02 10*3/uL (ref 0.00–0.07)
Basophils Absolute: 0 10*3/uL (ref 0.0–0.1)
Basophils Relative: 0 %
Eosinophils Absolute: 0 10*3/uL (ref 0.0–0.5)
Eosinophils Relative: 0 %
HCT: 42.6 % (ref 36.0–46.0)
Hemoglobin: 14.2 g/dL (ref 12.0–15.0)
Immature Granulocytes: 0 %
Lymphocytes Relative: 19 %
Lymphs Abs: 1.6 10*3/uL (ref 0.7–4.0)
MCH: 31 pg (ref 26.0–34.0)
MCHC: 33.3 g/dL (ref 30.0–36.0)
MCV: 93 fL (ref 80.0–100.0)
Monocytes Absolute: 0.9 10*3/uL (ref 0.1–1.0)
Monocytes Relative: 11 %
Neutro Abs: 6 10*3/uL (ref 1.7–7.7)
Neutrophils Relative %: 70 %
Platelets: 182 10*3/uL (ref 150–400)
RBC: 4.58 MIL/uL (ref 3.87–5.11)
RDW: 12.4 % (ref 11.5–15.5)
WBC: 8.6 10*3/uL (ref 4.0–10.5)
nRBC: 0 % (ref 0.0–0.2)

## 2019-07-14 LAB — URINALYSIS, ROUTINE W REFLEX MICROSCOPIC
Bacteria, UA: NONE SEEN
Bilirubin Urine: NEGATIVE
Glucose, UA: NEGATIVE mg/dL
Ketones, ur: NEGATIVE mg/dL
Leukocytes,Ua: NEGATIVE
Nitrite: NEGATIVE
Protein, ur: NEGATIVE mg/dL
Specific Gravity, Urine: 1.004 — ABNORMAL LOW (ref 1.005–1.030)
pH: 7 (ref 5.0–8.0)

## 2019-07-14 LAB — LIPASE, BLOOD: Lipase: 25 U/L (ref 11–51)

## 2019-07-14 LAB — PREGNANCY, URINE: Preg Test, Ur: NEGATIVE

## 2019-07-14 LAB — POC OCCULT BLOOD, ED: Fecal Occult Bld: POSITIVE — AB

## 2019-07-14 MED ORDER — ONDANSETRON 4 MG PO TBDP: 4.0000 mg | ORAL_TABLET | Freq: Three times a day (TID) | ORAL | 0 refills | Status: DC | PRN

## 2019-07-14 MED ORDER — PANTOPRAZOLE SODIUM 20 MG PO TBEC: 20.0000 mg | DELAYED_RELEASE_TABLET | Freq: Every day | ORAL | 0 refills | Status: DC

## 2019-07-14 MED ORDER — SODIUM CHLORIDE 0.9 % IV BOLUS
1000.0000 mL | Freq: Once | INTRAVENOUS | Status: AC
  Administered 2019-07-14: 1000 mL via INTRAVENOUS

## 2019-07-14 MED ORDER — ONDANSETRON HCL 4 MG/2ML IJ SOLN
4.0000 mg | Freq: Once | INTRAMUSCULAR | Status: AC
  Administered 2019-07-14: 4 mg via INTRAVENOUS
  Filled 2019-07-14: qty 2

## 2019-07-14 NOTE — ED Provider Notes (Signed)
33 yo female with N/V/D, no abdominal pain. Hemoccult +. Takes goodpowder regularly, no abdominal pain. Plan is antiemetics and fluids. Rash to right arm/leg/face- tx topically with cortisone.  Awaiting UA, fluids, PO challenge. Hgb normal.  Physical Exam  BP 116/77   Pulse 86   Temp 99.1 F (37.3 C) (Oral)   Resp 19   Ht 5\' 2"  (1.575 m)   Wt 74.8 kg   SpO2 98%   BMI 30.18 kg/m   Physical Exam Patient is alert, respirations even and unlabored.  Rash to arm and leg consistent with contact dermatitis. ED Course/Procedures     Procedures  MDM  Viewed results with patient, recommend Protonix daily for GI bleed with follow-up with GI, return precautions given.  Regarding her rash, she may apply Benadryl or cortisone to this.       Tacy Learn, PA-C 07/14/19 2035    Lucrezia Starch, MD 07/15/19 [phone]

## 2019-07-14 NOTE — ED Triage Notes (Signed)
Pt states about 1 week ago she developed a rash on the right side of face/arm/leg. Endorses arm bing itchy. Pt reports she had black stool yesterday.  Endorses N/V

## 2019-07-14 NOTE — Discharge Instructions (Addendum)
Continue with cortisone cream or Benadryl to the rash, suspect contact dermatitis which should go away on its own in the next 2 weeks.  Take Protonix daily for the blood in your stool today, it is possible you have a stomach ulcer causing this bleeding.  Recommend follow-up with gastroenterology for further evaluation, referral given. Take Zofran as needed as prescribed for nausea and vomiting.  Return to ER for new or worsening symptoms.

## 2019-07-14 NOTE — ED Notes (Signed)
Discharge instructions discussed with pt pt verbalized understanding and no questions at this time.   Work excuse given to pt.

## 2019-07-14 NOTE — ED Notes (Signed)
Pt able to tolerate PO fluids.  
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
## 2019-07-20 ENCOUNTER — Encounter: Payer: Self-pay | Admitting: Physician Assistant

## 2019-08-02 ENCOUNTER — Ambulatory Visit: Payer: Self-pay | Admitting: Physician Assistant

## 2019-08-12 ENCOUNTER — Emergency Department (HOSPITAL_COMMUNITY)
Admission: EM | Admit: 2019-08-12 | Discharge: 2019-08-13 | Discharge status: Absent without leave | Payer: Medicaid Other

## 2019-08-12 ENCOUNTER — Other Ambulatory Visit: Payer: Self-pay

## 2019-10-30 ENCOUNTER — Other Ambulatory Visit: Payer: Self-pay

## 2019-10-30 ENCOUNTER — Emergency Department (HOSPITAL_COMMUNITY): Payer: Self-pay

## 2019-10-30 ENCOUNTER — Encounter (HOSPITAL_COMMUNITY): Payer: Self-pay | Admitting: Emergency Medicine

## 2019-10-30 ENCOUNTER — Emergency Department (HOSPITAL_COMMUNITY)
Admission: EM | Admit: 2019-10-30 | Discharge: 2019-10-30 | Disposition: A | Discharge status: Home / Self Care | Payer: Self-pay | Attending: Emergency Medicine | Admitting: Emergency Medicine

## 2019-10-30 DIAGNOSIS — R569 Unspecified convulsions: Secondary | ICD-10-CM | POA: Insufficient documentation

## 2019-10-30 DIAGNOSIS — F17210 Nicotine dependence, cigarettes, uncomplicated: Secondary | ICD-10-CM | POA: Insufficient documentation

## 2019-10-30 DIAGNOSIS — M25561 Pain in right knee: Secondary | ICD-10-CM | POA: Insufficient documentation

## 2019-10-30 DIAGNOSIS — Z79899 Other long term (current) drug therapy: Secondary | ICD-10-CM | POA: Insufficient documentation

## 2019-10-30 NOTE — Discharge Instructions (Signed)

## 2019-10-30 NOTE — ED Notes (Signed)
Pt xray with transporter.
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
## 2020-02-15 ENCOUNTER — Ambulatory Visit (HOSPITAL_COMMUNITY)
Admission: EM | Admit: 2020-02-15 | Discharge: 2020-02-15 | Disposition: A | Discharge status: Home / Self Care | Payer: Medicaid Other | Attending: Internal Medicine | Admitting: Internal Medicine

## 2020-02-15 ENCOUNTER — Other Ambulatory Visit: Payer: Self-pay

## 2020-02-15 ENCOUNTER — Encounter (HOSPITAL_COMMUNITY): Payer: Self-pay

## 2020-02-15 DIAGNOSIS — Z79899 Other long term (current) drug therapy: Secondary | ICD-10-CM | POA: Diagnosis not present

## 2020-02-15 DIAGNOSIS — Z8349 Family history of other endocrine, nutritional and metabolic diseases: Secondary | ICD-10-CM | POA: Diagnosis not present

## 2020-02-15 DIAGNOSIS — M7712 Lateral epicondylitis, left elbow: Secondary | ICD-10-CM

## 2020-02-15 DIAGNOSIS — F17210 Nicotine dependence, cigarettes, uncomplicated: Secondary | ICD-10-CM | POA: Diagnosis not present

## 2020-02-15 DIAGNOSIS — Z882 Allergy status to sulfonamides status: Secondary | ICD-10-CM | POA: Insufficient documentation

## 2020-02-15 DIAGNOSIS — Z91041 Radiographic dye allergy status: Secondary | ICD-10-CM | POA: Diagnosis not present

## 2020-02-15 DIAGNOSIS — Z20822 Contact with and (suspected) exposure to covid-19: Secondary | ICD-10-CM | POA: Diagnosis not present

## 2020-02-15 DIAGNOSIS — J040 Acute laryngitis: Secondary | ICD-10-CM | POA: Diagnosis not present

## 2020-02-15 DIAGNOSIS — Z880 Allergy status to penicillin: Secondary | ICD-10-CM | POA: Diagnosis not present

## 2020-02-15 DIAGNOSIS — K219 Gastro-esophageal reflux disease without esophagitis: Secondary | ICD-10-CM | POA: Diagnosis not present

## 2020-02-15 DIAGNOSIS — R490 Dysphonia: Secondary | ICD-10-CM | POA: Diagnosis present

## 2020-02-15 DIAGNOSIS — Z8249 Family history of ischemic heart disease and other diseases of the circulatory system: Secondary | ICD-10-CM | POA: Insufficient documentation

## 2020-02-15 MED ORDER — IBUPROFEN 600 MG PO TABS: 600.0000 mg | ORAL_TABLET | Freq: Four times a day (QID) | ORAL | 0 refills | Status: DC | PRN

## 2020-02-15 NOTE — ED Triage Notes (Signed)
Pt c/o SOBOE and hoarseness onset today. Sinus congestion since Saturday. Denies abdom pain, n/v/d, fever or chills.   Pt also reports tingling to bilat arms/legs since Saturday.

## 2020-02-16 LAB — SARS CORONAVIRUS 2 (TAT 6-24 HRS): SARS Coronavirus 2: NEGATIVE
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
## 2020-04-06 ENCOUNTER — Emergency Department (HOSPITAL_COMMUNITY)
Admission: EM | Admit: 2020-04-06 | Discharge: 2020-04-06 | Disposition: A | Discharge status: Home / Self Care | Payer: Self-pay | Attending: Emergency Medicine | Admitting: Emergency Medicine

## 2020-04-06 ENCOUNTER — Encounter (HOSPITAL_COMMUNITY): Payer: Self-pay | Admitting: Emergency Medicine

## 2020-04-06 ENCOUNTER — Other Ambulatory Visit: Payer: Self-pay

## 2020-04-06 ENCOUNTER — Emergency Department (HOSPITAL_COMMUNITY): Payer: Self-pay

## 2020-04-06 DIAGNOSIS — Z23 Encounter for immunization: Secondary | ICD-10-CM | POA: Insufficient documentation

## 2020-04-06 DIAGNOSIS — F17210 Nicotine dependence, cigarettes, uncomplicated: Secondary | ICD-10-CM | POA: Insufficient documentation

## 2020-04-06 DIAGNOSIS — X58XXXA Exposure to other specified factors, initial encounter: Secondary | ICD-10-CM | POA: Insufficient documentation

## 2020-04-06 DIAGNOSIS — Y999 Unspecified external cause status: Secondary | ICD-10-CM | POA: Insufficient documentation

## 2020-04-06 DIAGNOSIS — S92355A Nondisplaced fracture of fifth metatarsal bone, left foot, initial encounter for closed fracture: Secondary | ICD-10-CM | POA: Insufficient documentation

## 2020-04-06 DIAGNOSIS — Y929 Unspecified place or not applicable: Secondary | ICD-10-CM | POA: Insufficient documentation

## 2020-04-06 DIAGNOSIS — Z79899 Other long term (current) drug therapy: Secondary | ICD-10-CM | POA: Insufficient documentation

## 2020-04-06 DIAGNOSIS — Y9389 Activity, other specified: Secondary | ICD-10-CM | POA: Insufficient documentation

## 2020-04-06 MED ORDER — IBUPROFEN 800 MG PO TABS
800.0000 mg | ORAL_TABLET | Freq: Once | ORAL | Status: AC
  Administered 2020-04-06: 800 mg via ORAL
  Filled 2020-04-06: qty 1

## 2020-04-06 MED ORDER — TETANUS-DIPHTH-ACELL PERTUSSIS 5-2.5-18.5 LF-MCG/0.5 IM SUSP
0.5000 mL | Freq: Once | INTRAMUSCULAR | Status: AC
  Administered 2020-04-06: 0.5 mL via INTRAMUSCULAR
  Filled 2020-04-06: qty 0.5

## 2020-04-06 MED ORDER — CELECOXIB 200 MG PO CAPS: 200.0000 mg | ORAL_CAPSULE | Freq: Two times a day (BID) | ORAL | 0 refills | Status: DC
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
## 2020-04-06 NOTE — Discharge Instructions (Signed)
Contact a health care provider if you have: Pain that gets worse or does not get better with medicine. A fever. A bad smell coming from your cast or splint. Get help right away if you have: Any of the following in your toes or your foot, even after loosening your splint (if applicable): Numbness. Tingling. Coldness. Blue skin. Redness or swelling that gets worse. Pain that suddenly becomes severe. 
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
## 2020-04-06 NOTE — ED Notes (Signed)
[employer] officer in to speak with pt   Rad awaiting in hall

## 2020-04-09 ENCOUNTER — Encounter: Payer: Self-pay | Admitting: Orthopaedic Surgery

## 2020-04-09 ENCOUNTER — Ambulatory Visit (INDEPENDENT_AMBULATORY_CARE_PROVIDER_SITE_OTHER): Payer: Self-pay | Admitting: Orthopaedic Surgery

## 2020-04-09 ENCOUNTER — Other Ambulatory Visit: Payer: Self-pay

## 2020-04-09 VITALS — BP 141/80 | HR 88 | Ht 63.0 in | Wt 180.0 lb

## 2020-04-09 DIAGNOSIS — S92355A Nondisplaced fracture of fifth metatarsal bone, left foot, initial encounter for closed fracture: Secondary | ICD-10-CM

## 2020-04-09 DIAGNOSIS — N766 Ulceration of vulva: Secondary | ICD-10-CM | POA: Insufficient documentation

## 2020-04-09 DIAGNOSIS — F17210 Nicotine dependence, cigarettes, uncomplicated: Secondary | ICD-10-CM
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
## 2020-04-09 NOTE — Patient Instructions (Addendum)
Steps to Quit Smoking Smoking tobacco is the leading cause of preventable death. It can affect almost every organ in the body. Smoking puts you and people around you at risk for many serious, long-lasting (chronic) diseases. Quitting smoking can be hard, but it is one of the best things that you can do for your health. It is never too late to quit. How do I get ready to quit? When you decide to quit smoking, make a plan to help you succeed. Before you quit:  Pick a date to quit. Set a date within the next 2 weeks to give you time to prepare.  Write down the reasons why you are quitting. Keep this list in places where you will see it often.  Tell your family, friends, and co-workers that you are quitting. Their support is important.  Talk with your doctor about the choices that may help you quit.  Find out if your health insurance will pay for these treatments.  Know the people, places, things, and activities that make you want to smoke (triggers). Avoid them. What first steps can I take to quit smoking?  Throw away all cigarettes at home, at work, and in your car.  Throw away the things that you use when you smoke, such as ashtrays and lighters.  Clean your car. Make sure to empty the ashtray.  Clean your home, including curtains and carpets. What can I do to help me quit smoking? Talk with your doctor about taking medicines and seeing a counselor at the same time. You are more likely to succeed when you do both.  If you are pregnant or breastfeeding, talk with your doctor about counseling or other ways to quit smoking. Do not take medicine to help you quit smoking unless your doctor tells you to do so. To quit smoking: Quit right away  Quit smoking totally, instead of slowly cutting back on how much you smoke over a period of time.  Go to counseling. You are more likely to quit if you go to counseling sessions regularly. Take medicine You may take medicines to help you quit. Some  medicines need a prescription, and some you can buy over-the-counter. Some medicines may contain a drug called nicotine to replace the nicotine in cigarettes. Medicines may:  Help you to stop having the desire to smoke (cravings).  Help to stop the problems that come when you stop smoking (withdrawal symptoms). Your doctor may ask you to use:  Nicotine patches, gum, or lozenges.  Nicotine inhalers or sprays.  Non-nicotine medicine that is taken by mouth. Find resources Find resources and other ways to help you quit smoking and remain smoke-free after you quit. These resources are most helpful when you use them often. They include:  Online chats with a counselor.  Phone quitlines.  Printed self-help materials.  Support groups or group counseling.  Text messaging programs.  Mobile phone apps. Use apps on your mobile phone or tablet that can help you stick to your quit plan. There are many free apps for mobile phones and tablets as well as websites. Examples include Quit Guide from the CDC and smokefree.gov  What things can I do to make it easier to quit?   Talk to your family and friends. Ask them to support and encourage you.  Call a phone quitline (1-800-QUIT-NOW), reach out to support groups, or work with a counselor.  Ask people who smoke to not smoke around you.  Avoid places that make you want to smoke,   such as: ? Bars. ? Parties. ? Smoke-break areas at work.  Spend time with people who do not smoke.  Lower the stress in your life. Stress can make you want to smoke. Try these things to help your stress: ? Getting regular exercise. ? Doing deep-breathing exercises. ? Doing yoga. ? Meditating. ? Doing a body scan. To do this, close your eyes, focus on one area of your body at a time from head to toe. Notice which parts of your body are tense. Try to relax the muscles in those areas. How will I feel when I quit smoking? Day 1 to 3 weeks Within the first 24 hours,  you may start to have some problems that come from quitting tobacco. These problems are very bad 2-3 days after you quit, but they do not often last for more than 2-3 weeks. You may get these symptoms:  Mood swings.  Feeling restless, nervous, angry, or annoyed.  Trouble concentrating.  Dizziness.  Strong desire for high-sugar foods and nicotine.  Weight gain.  Trouble pooping (constipation).  Feeling like you may vomit (nausea).  Coughing or a sore throat.  Changes in how the medicines that you take for other issues work in your body.  Depression.  Trouble sleeping (insomnia). Week 3 and afterward After the first 2-3 weeks of quitting, you may start to notice more positive results, such as:  Better sense of smell and taste.  Less coughing and sore throat.  Slower heart rate.  Lower blood pressure.  Clearer skin.  Better breathing.  Fewer sick days. Quitting smoking can be hard. Do not give up if you fail the first time. Some people need to try a few times before they succeed. Do your best to stick to your quit plan, and talk with your doctor if you have any questions or concerns. Summary  Smoking tobacco is the leading cause of preventable death. Quitting smoking can be hard, but it is one of the best things that you can do for your health.  When you decide to quit smoking, make a plan to help you succeed.  Quit smoking right away, not slowly over a period of time.  When you start quitting, seek help from your doctor, family, or friends. This information is not intended to replace advice given to you by your health care provider. Make sure you discuss any questions you have with your health care provider. Document Revised: 08/04/2019 Document Reviewed: 01/28/2019 Elsevier Patient Education  2020 [employer].   OUT OF WORK

## 2020-04-15 ENCOUNTER — Telehealth: Payer: Self-pay | Admitting: Orthopaedic Surgery

## 2020-04-15 NOTE — Telephone Encounter (Signed)
Yes, write note

## 2020-04-15 NOTE — Telephone Encounter (Signed)
Patient wants a note to return to work.  She is not using crutches anymore.  She is still using the cam walker.   Is it okay to write note for this patient?

## 2020-04-16 ENCOUNTER — Encounter: Payer: Self-pay | Admitting: Orthopaedic Surgery

## 2020-04-16 NOTE — Telephone Encounter (Signed)
Spoke with patient. Note issued accordingly.

## 2020-04-23 ENCOUNTER — Ambulatory Visit: Payer: Medicaid Other

## 2020-04-23 ENCOUNTER — Encounter: Payer: Self-pay | Admitting: Orthopaedic Surgery

## 2020-06-20 ENCOUNTER — Other Ambulatory Visit: Payer: Medicaid Other | Admitting: Adult Health

## 2020-07-14 ENCOUNTER — Encounter (HOSPITAL_COMMUNITY): Payer: Self-pay

## 2020-07-14 ENCOUNTER — Other Ambulatory Visit: Payer: Self-pay

## 2020-07-14 ENCOUNTER — Emergency Department (HOSPITAL_COMMUNITY)
Admission: EM | Admit: 2020-07-14 | Discharge: 2020-07-14 | Disposition: A | Discharge status: Home / Self Care | Payer: Medicaid Other | Attending: Emergency Medicine | Admitting: Emergency Medicine

## 2020-07-14 DIAGNOSIS — Z5321 Procedure and treatment not carried out due to patient leaving prior to being seen by health care provider: Secondary | ICD-10-CM | POA: Insufficient documentation

## 2020-07-14 DIAGNOSIS — R519 Headache, unspecified: Secondary | ICD-10-CM | POA: Insufficient documentation

## 2020-07-14 LAB — I-STAT BETA HCG BLOOD, ED (MC, WL, AP ONLY): I-stat hCG, quantitative: <5 m[IU]/mL (ref <5)

## 2020-07-14 LAB — CBC
HCT: 42.3 % (ref 36.0–46.0)
Hemoglobin: 13.7 g/dL (ref 12.0–15.0)
MCH: 30.6 pg (ref 26.0–34.0)
MCHC: 32.4 g/dL (ref 30.0–36.0)
MCV: 94.6 fL (ref 80.0–100.0)
Platelets: 228 10*3/uL (ref 150–400)
RBC: 4.47 MIL/uL (ref 3.87–5.11)
RDW: 12.2 % (ref 11.5–15.5)
WBC: 8 10*3/uL (ref 4.0–10.5)
nRBC: 0 % (ref 0.0–0.2)

## 2020-07-14 LAB — BASIC METABOLIC PANEL
Anion gap: 9 (ref 5–15)
BUN: 9 mg/dL (ref 6–20)
CO2: 25 mmol/L (ref 22–32)
Calcium: 9 mg/dL (ref 8.9–10.3)
Chloride: 104 mmol/L (ref 98–111)
Creatinine, Ser: 0.7 mg/dL (ref 0.44–1.00)
GFR calc Af Amer: >60 mL/min (ref >60)
GFR calc non Af Amer: >60 mL/min (ref >60)
Glucose, Bld: 88 mg/dL (ref 70–99)
Potassium: 3.9 mmol/L (ref 3.5–5.1)
Sodium: 138 mmol/L (ref 135–145)

## 2020-07-14 NOTE — ED Notes (Signed)
Pt was called x3 for VS with no response. Pt is not visualized in ED.

## 2020-07-14 NOTE — ED Triage Notes (Signed)
Patient complains of not feeling well with headache x 1 week. States was told by MD at appointment it was high, not prescribed meds. Patient alert and oriented, denies trauma

## 2020-07-15 ENCOUNTER — Other Ambulatory Visit: Payer: Self-pay

## 2020-07-15 ENCOUNTER — Emergency Department (HOSPITAL_COMMUNITY): Payer: Self-pay

## 2020-07-15 ENCOUNTER — Emergency Department (HOSPITAL_COMMUNITY)
Admission: EM | Admit: 2020-07-15 | Discharge: 2020-07-15 | Disposition: A | Discharge status: Home / Self Care | Payer: Self-pay | Attending: Emergency Medicine | Admitting: Emergency Medicine

## 2020-07-15 ENCOUNTER — Encounter (HOSPITAL_COMMUNITY): Payer: Self-pay

## 2020-07-15 ENCOUNTER — Ambulatory Visit: Payer: Self-pay

## 2020-07-15 DIAGNOSIS — R519 Headache, unspecified: Secondary | ICD-10-CM | POA: Insufficient documentation

## 2020-07-15 DIAGNOSIS — F17210 Nicotine dependence, cigarettes, uncomplicated: Secondary | ICD-10-CM | POA: Insufficient documentation

## 2020-07-15 MED ORDER — NAPROXEN 500 MG PO TABS: 500.0000 mg | ORAL_TABLET | Freq: Two times a day (BID) | ORAL | 0 refills | Status: DC

## 2020-07-15 MED ORDER — KETOROLAC TROMETHAMINE 60 MG/2ML IM SOLN
60.0000 mg | Freq: Once | INTRAMUSCULAR | Status: AC
  Administered 2020-07-15: 60 mg via INTRAMUSCULAR
  Filled 2020-07-15: qty 2

## 2020-07-15 MED ORDER — SUMATRIPTAN SUCCINATE 50 MG PO TABS: 50.0000 mg | ORAL_TABLET | ORAL | 0 refills | Status: DC | PRN

## 2020-07-15 NOTE — Discharge Instructions (Signed)
You will need to be followed up by family doctor, you may need referral to a neurologist if your headache continues.  Please take the following medications  Naproxen twice daily for the next week  Sumatriptan, 1 tablet immediately and another tablet 2 hours later if still having a headache.  Make sure you are drinking plenty of liquids and make sure you do not have a carbon monoxide leak at home.  If you develop fever stiff neck changes in vision weakness or numbness return immediately.  [facility] Primary Care Doctor List    Kari Baars MD. Specialty: Pulmonary Disease Contact information: [address]  [address]  [place] [address]  [phone]   Syliva Overman, MD. Specialty: [facility] Medicine Contact information: [address]  [place] [address]  [phone]   Lilyan Punt, MD. Specialty: [facility] Medicine Contact information: [address]  [place] [address]  [phone]   Avon Gully, MD Specialty: Internal Medicine Contact information: [address] [place] [address]  [phone]   Catalina Pizza, MD. Specialty: Internal Medicine Contact information: [address]  [place] [address]  [phone]    [facility] (Dr. Selena Batten) Specialty: Family Medicine Contact information: [address]  [place] [address]  [phone]   John Giovanni, MD. Specialty: [facility] Medicine Contact information: [address]  [address]  [place] [address]  [phone]   Carylon Perches, MD. Specialty: Internal Medicine Contact information: [address]  [address]  [place] [address]  [phone]    [facility]  [address] [place], [place] [address] [phone]  Services [facility] offers a variety of basic health services.  Services include but are not limited to: Blood pressure checks  Heart rate checks  Blood  sugar checks  Urine analysis  Rapid strep tests  Pregnancy tests.  Health education and referrals  People needing more complex services will be directed to a physician online. Using these virtual visits, doctors can evaluate and prescribe medicine and treatments. There will be no medication on-site, though [facility] will help patients fill their prescriptions at little to no cost.   For More information please go to: [email or web address]

## 2020-07-15 NOTE — ED Triage Notes (Signed)
Pt reports headache and intermittent numbness and tingling in hands and feet.  Reports bp has been elevated as well.
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
## 2020-07-16 ENCOUNTER — Other Ambulatory Visit (HOSPITAL_COMMUNITY)
Admission: RE | Admit: 2020-07-16 | Discharge: 2020-07-16 | Disposition: A | Discharge status: Home / Self Care | Payer: Medicaid Other | Source: Ambulatory Visit | Attending: Adult Health | Admitting: Adult Health

## 2020-07-16 ENCOUNTER — Ambulatory Visit (INDEPENDENT_AMBULATORY_CARE_PROVIDER_SITE_OTHER): Payer: Medicaid Other | Admitting: Adult Health

## 2020-07-16 ENCOUNTER — Encounter: Payer: Self-pay | Admitting: Adult Health

## 2020-07-16 VITALS — BP 121/78 | HR 83 | Ht 62.0 in | Wt 208.0 lb

## 2020-07-16 DIAGNOSIS — Z01419 Encounter for gynecological examination (general) (routine) without abnormal findings: Secondary | ICD-10-CM | POA: Insufficient documentation

## 2020-07-16 DIAGNOSIS — F329 Major depressive disorder, single episode, unspecified: Secondary | ICD-10-CM | POA: Insufficient documentation

## 2020-07-16 DIAGNOSIS — Z975 Presence of (intrauterine) contraceptive device: Secondary | ICD-10-CM

## 2020-07-16 DIAGNOSIS — Z8742 Personal history of other diseases of the female genital tract: Secondary | ICD-10-CM

## 2020-07-16 DIAGNOSIS — F32A Depression, unspecified: Secondary | ICD-10-CM | POA: Insufficient documentation

## 2020-07-16 NOTE — Progress Notes (Signed)
Patient ID: [name], female   DOB: 1985/12/17, 34 y.o.   MRN: [id number] History of Present Illness: [name] is a 34 year old white female, divorced, [id number], in for well woman gyn exam and pap, last seen in 2017, had IUD inserted 07/30/16, and had ASCUS +HPV 12/10/15, did not not get colpo that I see.  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  No PCP  Current Medications, Allergies, Past Medical History, Past Surgical History, Family History and Social History were reviewed in [employer] record.     Review of Systems:  Patient denies any  hearing loss, fatigue, blurred vision, shortness of breath, chest pain, abdominal pain, problems with bowel movements, urination, or intercourse(not active). No joint pain or mood swings. Had headache yesterday went to ER but left  Physical Exam:BP 121/78 (BP Location: Right Arm, Patient Position: Sitting, Cuff Size: Large)    Pulse 83    Ht 5\' 2"  (1.575 m)    Wt 208 lb (94.3 kg)    BMI 38.04 kg/m  General:  Well developed, well nourished, no acute distress Skin:  Warm and dry Neck:  Midline trachea, normal thyroid, good ROM, no lymphadenopathy Lungs; Clear to auscultation bilaterally Breast:  No dominant palpable mass, retraction, or nipple discharge Cardiovascular: Regular rate and rhythm Abdomen:  Soft, non tender, no hepatosplenomegaly Pelvic:  External genitalia is normal in appearance, no lesions.  The vagina is normal in appearance. Urethra has no lesions or masses. The cervix is bulbous.+IUD strings at os, pap with GC/CHL and high risk HPV genotyping performed. Uterus is felt to be normal size, shape, and contour.  No adnexal masses or tenderness noted.Bladder is non tender, no masses felt. Extremities/musculoskeletal:  No swelling or varicosities noted, no clubbing or cyanosis Psych:  No mood changes, alert and cooperative,seems happy AA is 0 PHQ 9 score is 18, is on celexa and elavil and most recently buspar,  sees [facility]  [facility] - 07/16/20 1518      Pregnancy Intention Screening   Does the patient want to become pregnant in the next year? No    Does the patient's partner want to become pregnant in the next year? No    Would the patient like to discuss contraceptive options today? Yes      Contraception Wrap Up   Current Method IUD or IUS    End Method IUD or IUS    Contraception Counseling Provided Yes         Examination chaperoned by 07/18/20 LPN  Impression and Plan: 1. Encounter for gynecological examination with Papanicolaou smear of cervix Pap sent Physical in 1 year Pap in 3 if normal  2. IUD (intrauterine device) in place -placed 07/30/16  3. Depression, unspecified depression type Follow up with [facility] and continue meds   4. History of abnormal cervical Pap smear Pap sent today

## 2020-07-18 LAB — CYTOLOGY - PAP
Chlamydia: NEGATIVE
Comment: NEGATIVE
Comment: NEGATIVE
Comment: NORMAL
Diagnosis: NEGATIVE
High risk HPV: NEGATIVE
Neisseria Gonorrhea: NEGATIVE

## 2020-11-04 ENCOUNTER — Encounter (HOSPITAL_COMMUNITY): Payer: Self-pay | Admitting: Emergency Medicine

## 2020-11-04 ENCOUNTER — Emergency Department (HOSPITAL_COMMUNITY)
Admission: EM | Admit: 2020-11-04 | Discharge: 2020-11-04 | Disposition: A | Discharge status: Home / Self Care | Payer: Medicaid Other | Attending: Emergency Medicine | Admitting: Emergency Medicine

## 2020-11-04 ENCOUNTER — Other Ambulatory Visit: Payer: Self-pay

## 2020-11-04 ENCOUNTER — Telehealth (HOSPITAL_COMMUNITY): Payer: Self-pay | Admitting: Medical

## 2020-11-04 DIAGNOSIS — F17210 Nicotine dependence, cigarettes, uncomplicated: Secondary | ICD-10-CM | POA: Insufficient documentation

## 2020-11-04 DIAGNOSIS — J020 Streptococcal pharyngitis: Secondary | ICD-10-CM

## 2020-11-04 DIAGNOSIS — Z20822 Contact with and (suspected) exposure to covid-19: Secondary | ICD-10-CM | POA: Insufficient documentation

## 2020-11-04 DIAGNOSIS — J029 Acute pharyngitis, unspecified: Secondary | ICD-10-CM

## 2020-11-04 LAB — RESP PANEL BY RT-PCR (FLU A&B, COVID) ARPGX2
Influenza A by PCR: NEGATIVE
Influenza B by PCR: NEGATIVE
SARS Coronavirus 2 by RT PCR: NEGATIVE

## 2020-11-04 LAB — POC URINE PREG, ED: Preg Test, Ur: NEGATIVE

## 2020-11-04 LAB — GROUP A STREP BY PCR: Group A Strep by PCR: DETECTED — AB

## 2020-11-04 MED ORDER — CLINDAMYCIN HCL 150 MG PO CAPS: 300.0000 mg | ORAL_CAPSULE | Freq: Three times a day (TID) | ORAL | 0 refills | Status: AC

## 2020-11-04 MED ORDER — HYDROCODONE-ACETAMINOPHEN 5-325 MG PO TABS
1.0000 | ORAL_TABLET | Freq: Once | ORAL | Status: AC
  Administered 2020-11-04: 1 via ORAL
  Filled 2020-11-04: qty 1

## 2020-11-04 NOTE — ED Triage Notes (Signed)
Pt c/o left sided throat pain that started 2 weeks ago, pt states about a week ago it became sore on both sides of her throat.
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
## 2020-11-04 NOTE — Discharge Instructions (Signed)
At this time there does not appear to be the presence of an emergent medical condition, however there is always the potential for conditions to change. Please read and follow the below instructions.  Please return to the Emergency Department immediately for any new or worsening symptoms or if your symptoms do not improve within 3 days. Please be sure to follow up with your Primary Care Provider within one week regarding your visit today; please call their office to schedule an appointment even if you are feeling better for a follow-up visit. Please take your antibiotic Clindamycin as prescribed until complete to help with your symptoms.  Please drink enough water to avoid dehydration and get plenty of rest.  Go to the nearest Emergency Department immediately if: You have fever or chills You vomit. You have a very bad headache. Your neck hurts or feels stiff. You have chest pain or are short of breath. You have drooling, very bad throat pain, or changes in your voice. Your neck is swollen, or the skin gets red and tender. Your mouth is dry, or you are peeing less than normal. You keep feeling more tired or have trouble waking up. Your joints are red or painful. You have any new/concerning or worsening of symptoms  Please read the additional information packets attached to your discharge summary.  Do not take your medicine if  develop an itchy rash, swelling in your mouth or lips, or difficulty breathing; call 911 and seek immediate emergency medical attention if this occurs.  You may review your lab tests and imaging results in their entirety on your MyChart account.  Please discuss all results of fully with your primary care provider and other specialist at your follow-up visit.  Note: Portions of this text may have been transcribed using voice recognition software. Every effort was made to ensure accuracy; however, inadvertent computerized transcription errors may still be present.

## 2020-11-04 NOTE — ED Notes (Signed)
Pt left AMA °

## 2020-11-04 NOTE — ED Notes (Signed)
Pt states that she has had a sore throat radiating from right to left side x 3 weeks. She states that she has taken nothing OTC for treatment.

## 2020-11-04 NOTE — Telephone Encounter (Signed)
Patient strep test returned positive.  I contacted the patient on the phone confirmed identification.  She reports she had to leave to pick up her kids as their babysitter had car trouble.  She was prescribed clindamycin 300 mg p.o. 3 times daily x10 days for treatment of strep pharyngitis.  She has penicillin allergy which is why amoxicillin was not prescribed.  I encouraged patient to follow-up with her PCP this week for recheck.  Patient advised that she may return to the emergency department anytime if symptoms worsen.  At this time there does not appear to be any evidence of an acute emergency medical condition and the patient appears stable for discharge with appropriate outpatient follow up. Diagnosis was discussed with patient who verbalizes understanding of care plan and is agreeable to discharge. I have discussed return precautions with patient who verbalizes understanding. Patient encouraged to follow-up with their PCP. All questions answered.  Note: Portions of this report may have been transcribed using voice recognition software. Every effort was made to ensure accuracy; however, inadvertent computerized transcription errors may still be present.   Note: Portions of this report may have been transcribed using voice recognition software. Every effort was made to ensure accuracy; however, inadvertent computerized transcription errors may still be present.

## 2020-12-11 ENCOUNTER — Encounter (HOSPITAL_COMMUNITY): Payer: Self-pay

## 2020-12-11 ENCOUNTER — Emergency Department (HOSPITAL_COMMUNITY)
Admission: EM | Admit: 2020-12-11 | Discharge: 2020-12-11 | Disposition: A | Discharge status: Home / Self Care | Payer: HRSA Program | Attending: Emergency Medicine | Admitting: Emergency Medicine

## 2020-12-11 ENCOUNTER — Other Ambulatory Visit: Payer: Self-pay

## 2020-12-11 DIAGNOSIS — R059 Cough, unspecified: Secondary | ICD-10-CM | POA: Diagnosis present

## 2020-12-11 DIAGNOSIS — F17210 Nicotine dependence, cigarettes, uncomplicated: Secondary | ICD-10-CM | POA: Diagnosis not present

## 2020-12-11 DIAGNOSIS — Z20822 Contact with and (suspected) exposure to covid-19: Secondary | ICD-10-CM

## 2020-12-11 DIAGNOSIS — J029 Acute pharyngitis, unspecified: Secondary | ICD-10-CM | POA: Diagnosis not present

## 2020-12-11 DIAGNOSIS — J020 Streptococcal pharyngitis: Secondary | ICD-10-CM

## 2020-12-11 LAB — SARS CORONAVIRUS 2 (TAT 6-24 HRS): SARS Coronavirus 2: NEGATIVE

## 2020-12-11 LAB — GROUP A STREP BY PCR: Group A Strep by PCR: DETECTED — AB

## 2020-12-11 MED ORDER — AZITHROMYCIN 250 MG PO TABS: 250.0000 mg | ORAL_TABLET | Freq: Every day | ORAL | 0 refills | Status: DC

## 2020-12-11 NOTE — Discharge Instructions (Addendum)
If you had any testing done today, the results will show up on your MyChart phone app in 24 hours.  Call your primary care doctor in the next 1-2 days to arrange video follow-up.   Use a finger pulse oximeter at home.  You may purchase one at [facility] or [facility] or online.  If the numbers drops and stays below 90%, return immediately back to the ER.  Otherwise increase your fluid intake, isolate at home for 5 days after symptoms resolve, and inform recent close contacts of the need to test for Covid.  
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
## 2020-12-11 NOTE — ED Triage Notes (Addendum)
Mother reports sore throat and enlarged lymph nodes. Productive cough. Exposure to covid. Mother has brought 3 kids in that had covid exposure from babysitter husband. Mother treated for strep throat 3 weeks ago

## 2021-04-04 NOTE — Progress Notes (Signed)
This encounter was created in error - please disregard.

## 2021-05-25 ENCOUNTER — Other Ambulatory Visit: Payer: Self-pay

## 2021-05-25 ENCOUNTER — Emergency Department (HOSPITAL_COMMUNITY)
Admission: EM | Admit: 2021-05-25 | Discharge: 2021-05-25 | Disposition: A | Discharge status: Home / Self Care | Payer: PRIVATE HEALTH INSURANCE | Attending: Emergency Medicine | Admitting: Emergency Medicine

## 2021-05-25 ENCOUNTER — Encounter (HOSPITAL_COMMUNITY): Payer: Self-pay | Admitting: Emergency Medicine

## 2021-05-25 DIAGNOSIS — M542 Cervicalgia: Secondary | ICD-10-CM | POA: Diagnosis not present

## 2021-05-25 DIAGNOSIS — Z5321 Procedure and treatment not carried out due to patient leaving prior to being seen by health care provider: Secondary | ICD-10-CM | POA: Diagnosis not present

## 2021-05-25 DIAGNOSIS — R202 Paresthesia of skin: Secondary | ICD-10-CM | POA: Insufficient documentation

## 2021-05-25 DIAGNOSIS — M549 Dorsalgia, unspecified: Secondary | ICD-10-CM | POA: Diagnosis not present

## 2021-05-25 NOTE — ED Triage Notes (Signed)
Pt c/o neck and back pain that is getting progressively worse.  Pt states both her hands go numb at times.

## 2021-06-27 ENCOUNTER — Emergency Department (HOSPITAL_COMMUNITY)
Admission: EM | Admit: 2021-06-27 | Discharge: 2021-06-27 | Disposition: A | Discharge status: Home / Self Care | Payer: PRIVATE HEALTH INSURANCE | Attending: Emergency Medicine | Admitting: Emergency Medicine

## 2021-06-27 ENCOUNTER — Other Ambulatory Visit: Payer: Self-pay

## 2021-06-27 ENCOUNTER — Emergency Department (HOSPITAL_COMMUNITY): Payer: PRIVATE HEALTH INSURANCE

## 2021-06-27 ENCOUNTER — Encounter (HOSPITAL_COMMUNITY): Payer: Self-pay

## 2021-06-27 DIAGNOSIS — F17210 Nicotine dependence, cigarettes, uncomplicated: Secondary | ICD-10-CM | POA: Diagnosis not present

## 2021-06-27 DIAGNOSIS — R001 Bradycardia, unspecified: Secondary | ICD-10-CM | POA: Insufficient documentation

## 2021-06-27 DIAGNOSIS — R0602 Shortness of breath: Secondary | ICD-10-CM | POA: Diagnosis present

## 2021-06-27 DIAGNOSIS — U071 COVID-19: Secondary | ICD-10-CM | POA: Diagnosis not present

## 2021-06-27 LAB — BASIC METABOLIC PANEL
Anion gap: 5 (ref 5–15)
BUN: 9 mg/dL (ref 6–20)
CO2: 26 mmol/L (ref 22–32)
Calcium: 8.5 mg/dL — ABNORMAL LOW (ref 8.9–10.3)
Chloride: 104 mmol/L (ref 98–111)
Creatinine, Ser: 0.61 mg/dL (ref 0.44–1.00)
GFR, Estimated: >60 mL/min (ref >60)
Glucose, Bld: 85 mg/dL (ref 70–99)
Potassium: 4 mmol/L (ref 3.5–5.1)
Sodium: 135 mmol/L (ref 135–145)

## 2021-06-27 LAB — HCG, QUANTITATIVE, PREGNANCY: hCG, Beta Chain, Quant, S: 1 m[IU]/mL (ref <5)

## 2021-06-27 MED ORDER — DEXAMETHASONE 4 MG PO TABS
6.0000 mg | ORAL_TABLET | Freq: Once | ORAL | Status: AC
  Administered 2021-06-27: 6 mg via ORAL
  Filled 2021-06-27: qty 2

## 2021-06-27 MED ORDER — ALBUTEROL SULFATE HFA 108 (90 BASE) MCG/ACT IN AERS
2.0000 | INHALATION_SPRAY | Freq: Once | RESPIRATORY_TRACT | Status: AC
  Administered 2021-06-27: 2 via RESPIRATORY_TRACT
  Filled 2021-06-27: qty 6.7

## 2021-06-27 MED ORDER — AEROCHAMBER PLUS FLO-VU MEDIUM MISC
1.0000 | Freq: Once | Status: AC
  Administered 2021-06-27: 1
  Filled 2021-06-27: qty 1

## 2021-06-27 MED ORDER — BENZONATATE 100 MG PO CAPS: 200.0000 mg | ORAL_CAPSULE | Freq: Three times a day (TID) | ORAL | 0 refills | Status: DC | PRN

## 2021-06-27 MED ORDER — DEXAMETHASONE 6 MG PO TABS: 6.0000 mg | ORAL_TABLET | Freq: Every day | ORAL | 0 refills | Status: AC

## 2021-06-27 NOTE — ED Notes (Signed)
Pt maintained 100% during ambulation. Denies Weakness, dizziness, or SOB.

## 2021-06-27 NOTE — ED Triage Notes (Signed)
Pt reports tested positive for covid last Saturday.  C/O pain in chest and back, worse with coughing.

## 2021-06-27 NOTE — Discharge Instructions (Addendum)
Your exam, lab tests and chest x-ray are reassuring today, you do not have any signs of pneumonia on today's exam and your chest x-ray is clear.  Continue using NyQuil to help you with your nighttime cough.  You have also been prescribed a medicine to help you better with your frequency of cough during the daytime.  If you are wheezing you can use the inhaler you received today taking 2 puffs every 4 hours for cough or wheezing.  Also prescribed you additional steroids to help you recover from this illness, specifically to help reduce inflammation in your airways.  Take your next dose of the Decadron tomorrow morning.  Rest to make sure you are drinking plenty of fluids.  You may use an antidiarrheal medicine of choice for help with this symptom, I prefer Imodium, you do not need a prescription for this medication.
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
## 2021-07-01 ENCOUNTER — Ambulatory Visit (INDEPENDENT_AMBULATORY_CARE_PROVIDER_SITE_OTHER): Payer: No Typology Code available for payment source | Admitting: Otolaryngology

## 2021-07-21 ENCOUNTER — Other Ambulatory Visit: Payer: No Typology Code available for payment source | Admitting: Adult Health

## 2021-08-18 ENCOUNTER — Other Ambulatory Visit: Payer: Self-pay

## 2021-08-18 ENCOUNTER — Emergency Department (HOSPITAL_COMMUNITY): Payer: PRIVATE HEALTH INSURANCE

## 2021-08-18 ENCOUNTER — Encounter (HOSPITAL_COMMUNITY): Payer: Self-pay | Admitting: Emergency Medicine

## 2021-08-18 ENCOUNTER — Emergency Department (HOSPITAL_COMMUNITY)
Admission: EM | Admit: 2021-08-18 | Discharge: 2021-08-18 | Disposition: A | Discharge status: Home / Self Care | Payer: PRIVATE HEALTH INSURANCE | Attending: Emergency Medicine | Admitting: Emergency Medicine

## 2021-08-18 DIAGNOSIS — M79672 Pain in left foot: Secondary | ICD-10-CM | POA: Insufficient documentation

## 2021-08-18 DIAGNOSIS — F17210 Nicotine dependence, cigarettes, uncomplicated: Secondary | ICD-10-CM | POA: Diagnosis not present

## 2021-08-18 MED ORDER — NAPROXEN 500 MG PO TABS: 500.0000 mg | ORAL_TABLET | Freq: Two times a day (BID) | ORAL | 0 refills | Status: DC
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
## 2021-08-18 NOTE — Discharge Instructions (Addendum)
Imaging reveals you have some degenerative changes of the left heel as well as the bone spurs.  I started you on an NSAID please take as prescribed.  Recommend applying heat and or ice to the area and stretch out the heel muscles as well decrease inflammation and swelling.  Please follow-up with with your orthopedic surgeon for further evaluation.  Come back to the emergency department if you develop chest pain, shortness of breath, severe abdominal pain, uncontrolled nausea, vomiting, diarrhea.

## 2021-08-18 NOTE — ED Triage Notes (Signed)
Pt reports left foot/heel pain and swelling that started Wednesday. Denies injury.

## 2021-08-21 ENCOUNTER — Other Ambulatory Visit: Payer: No Typology Code available for payment source | Admitting: Adult Health

## 2021-08-27 ENCOUNTER — Encounter: Payer: Self-pay | Admitting: Orthopedic Surgery

## 2021-08-27 ENCOUNTER — Other Ambulatory Visit: Payer: Self-pay

## 2021-08-27 ENCOUNTER — Ambulatory Visit (INDEPENDENT_AMBULATORY_CARE_PROVIDER_SITE_OTHER): Payer: No Typology Code available for payment source | Admitting: Orthopedic Surgery

## 2021-08-27 VITALS — BP 132/66 | HR 72 | Ht 63.0 in | Wt 208.4 lb

## 2021-08-27 DIAGNOSIS — M76822 Posterior tibial tendinitis, left leg: Secondary | ICD-10-CM | POA: Diagnosis not present

## 2021-08-27 DIAGNOSIS — M76829 Posterior tibial tendinitis, unspecified leg: Secondary | ICD-10-CM

## 2021-08-27 MED ORDER — PREDNISONE 10 MG PO TABS: ORAL_TABLET | ORAL | 0 refills | Status: DC
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
## 2021-09-02 ENCOUNTER — Telehealth: Payer: Self-pay | Admitting: Orthopedic Surgery

## 2021-09-02 NOTE — Telephone Encounter (Signed)
Noted/ thanks Make sure she keeps appointment

## 2021-09-02 NOTE — Telephone Encounter (Signed)
Call received from patient - phone connection not clear - relays foot is hurting in boot; said medication is not helping. Aware of her appointment on 09/10/21   Please advise.

## 2021-09-03 NOTE — Telephone Encounter (Signed)
Called patient to relay; reached voice mail, left message.

## 2021-09-04 ENCOUNTER — Telehealth: Payer: Self-pay | Admitting: Orthopedic Surgery

## 2021-09-04 NOTE — Telephone Encounter (Signed)
Ice  Elevate  Use ibuprofen when done with prescription of prednisone she will finish it today Wear boot Keep appointment next week  That was advise, I called her we discussed she voiced understanding  To Dr Fran Lowes

## 2021-09-04 NOTE — Telephone Encounter (Signed)
Patient returned call per previous phone note; again poor phone connections. Relays has further questions - said still having discomfort no matter how her leg is positioned - when straight, when bent, when sitting, said that her foot, up to her knee, aches; said the medication did not help much. Aware to keep the appointment 09/10/21. Please call patient with any other advice.

## 2021-09-10 ENCOUNTER — Other Ambulatory Visit: Payer: Self-pay

## 2021-09-10 ENCOUNTER — Encounter: Payer: Self-pay | Admitting: Orthopedic Surgery

## 2021-09-10 ENCOUNTER — Ambulatory Visit (INDEPENDENT_AMBULATORY_CARE_PROVIDER_SITE_OTHER): Payer: No Typology Code available for payment source | Admitting: Orthopedic Surgery

## 2021-09-10 VITALS — BP 130/79 | HR 73 | Ht 63.0 in | Wt 208.0 lb

## 2021-09-10 DIAGNOSIS — M76822 Posterior tibial tendinitis, left leg: Secondary | ICD-10-CM

## 2021-09-10 DIAGNOSIS — M76829 Posterior tibial tendinitis, unspecified leg: Secondary | ICD-10-CM

## 2021-09-10 DIAGNOSIS — M25562 Pain in left knee: Secondary | ICD-10-CM | POA: Diagnosis not present

## 2021-09-10 MED ORDER — HYDROCODONE-ACETAMINOPHEN 5-325 MG PO TABS: 1.0000 | ORAL_TABLET | Freq: Three times a day (TID) | ORAL | 0 refills | Status: AC | PRN

## 2021-09-10 MED ORDER — NAPROXEN 500 MG PO TABS: 500.0000 mg | ORAL_TABLET | Freq: Two times a day (BID) | ORAL | 2 refills | Status: DC

## 2021-09-10 NOTE — Progress Notes (Signed)
Chief Complaint  Patient presents with   Foot Pain    Left, no better hurting all the way up in the knee and knee swells sometimes    Background:  [name] is a 35 year old female previously seen for nondisplaced fracture of the fifth metatarsal bone of the left foot. She has a job now that requires her to stand for long periods of time and she thinks she may have gone back to work too soon and presented to the ER recently with a 2 to 3-week history of pain on the medial posterior and plantar aspect of her left foot. Once the heel started hurting the previous fracture site also started to bother her. The pain became severe when she was standing. Her pain was unrelieved by naproxen    Recommend a short cam walker Medrol Dosepak 2 weeks out of work with a 2-week follow-up     Meds ordered this encounter  Medications   predniSONE (DELTASONE) 10 MG tablet      Sig: 10 mg double strength Dosepak 12 days as directed    Crutches NWB for 4 weeks   Office visit today  Patient did not improve at all with the measures mentioned above she still having pain in the left ankle and leg and now she is having swelling and pain in her left knee.  It is difficult for her to get off of her feet because she has 3 young children  Reexamination reveals tenderness in the same area the heel itself is nontender she has mild tenderness in the Achilles but most of her tenderness again is in the arch and posterior aspect of the tibia  Recommend Crutches with protected weightbearing in the boot Anti-inflammatory (Medicaid restrictions on that) Hydrocodone for pain Out of work  Return 1 week if no improvement consider further imaging  Meds ordered this encounter  Medications   naproxen (NAPROSYN) 500 MG tablet    Sig: Take 1 tablet (500 mg total) by mouth 2 (two) times daily with a meal.    Dispense:  60 tablet    Refill:  2   HYDROcodone-acetaminophen (NORCO/VICODIN) 5-325 MG tablet    Sig: Take 1 tablet  by mouth every 8 (eight) hours as needed for up to 5 days for moderate pain.    Dispense:  15 tablet    Refill:  0     Meds ordered this encounter  Medications   naproxen (NAPROSYN) 500 MG tablet    Sig: Take 1 tablet (500 mg total) by mouth 2 (two) times daily with a meal.    Dispense:  60 tablet    Refill:  2   HYDROcodone-acetaminophen (NORCO/VICODIN) 5-325 MG tablet    Sig: Take 1 tablet by mouth every 8 (eight) hours as needed for up to 5 days for moderate pain.    Dispense:  15 tablet    Refill:  0

## 2021-09-10 NOTE — Patient Instructions (Signed)
Protected weight bearing with crutches   Start naprosyn   Start norco   OOW x  1week

## 2021-09-17 ENCOUNTER — Ambulatory Visit (INDEPENDENT_AMBULATORY_CARE_PROVIDER_SITE_OTHER): Payer: No Typology Code available for payment source | Admitting: Orthopedic Surgery

## 2021-09-17 ENCOUNTER — Encounter: Payer: Self-pay | Admitting: Orthopedic Surgery

## 2021-09-17 ENCOUNTER — Other Ambulatory Visit: Payer: Self-pay

## 2021-09-17 VITALS — BP 140/75 | HR 72 | Ht 63.0 in | Wt 208.0 lb

## 2021-09-17 DIAGNOSIS — M76822 Posterior tibial tendinitis, left leg: Secondary | ICD-10-CM

## 2021-09-17 DIAGNOSIS — M79672 Pain in left foot: Secondary | ICD-10-CM

## 2021-09-17 DIAGNOSIS — M76829 Posterior tibial tendinitis, unspecified leg: Secondary | ICD-10-CM

## 2021-09-17 DIAGNOSIS — M79673 Pain in unspecified foot: Secondary | ICD-10-CM

## 2021-09-17 DIAGNOSIS — M545 Low back pain, unspecified: Secondary | ICD-10-CM

## 2021-09-17 MED ORDER — GABAPENTIN 100 MG PO CAPS: 100.0000 mg | ORAL_CAPSULE | Freq: Three times a day (TID) | ORAL | 2 refills | Status: DC

## 2021-09-17 MED ORDER — METHOCARBAMOL 500 MG PO TABS: 500.0000 mg | ORAL_TABLET | Freq: Three times a day (TID) | ORAL | 1 refills | Status: DC

## 2021-09-17 NOTE — Progress Notes (Signed)
Chief Complaint  Patient presents with   Foot Pain    L/ It still hurts with a sharp pain. Now my hip and knee is hurting real bad, I don't know what is going on.   Encounter Diagnoses  Name Primary?   Posterior tibial tendon dysfunction    Pain of plantar aspect of heel    Acute left-sided low back pain without sciatica Yes    [name] has noticed improvement in the posterior tibial tendon pain but she is now having heel pain on the plantar aspect and she also has developed lower back pain with radiation to the left knee  She has tenderness in the lower back normal range of motion of the left knee tenderness in the plantar aspect of the heel and less tenderness in the posterior tibial tendon  I am going to take the boot off because it is causing the spine to act up  We will put her on some new medications for that continue her hydrocodone place her in Tuli heel cups and see me in a couple of weeks  Meds ordered this encounter  Medications   methocarbamol (ROBAXIN) 500 MG tablet    Sig: Take 1 tablet (500 mg total) by mouth 3 (three) times daily.    Dispense:  60 tablet    Refill:  1   gabapentin (NEURONTIN) 100 MG capsule    Sig: Take 1 capsule (100 mg total) by mouth 3 (three) times daily.    Dispense:  90 capsule    Refill:  2

## 2021-09-18 ENCOUNTER — Ambulatory Visit: Payer: PRIVATE HEALTH INSURANCE | Admitting: Orthopedic Surgery

## 2021-10-03 ENCOUNTER — Emergency Department (HOSPITAL_COMMUNITY)
Admission: EM | Admit: 2021-10-03 | Discharge: 2021-10-03 | Disposition: A | Discharge status: Home / Self Care | Payer: PRIVATE HEALTH INSURANCE | Attending: Emergency Medicine | Admitting: Emergency Medicine

## 2021-10-03 ENCOUNTER — Other Ambulatory Visit: Payer: Self-pay

## 2021-10-03 DIAGNOSIS — J020 Streptococcal pharyngitis: Secondary | ICD-10-CM | POA: Diagnosis not present

## 2021-10-03 DIAGNOSIS — Z20822 Contact with and (suspected) exposure to covid-19: Secondary | ICD-10-CM | POA: Diagnosis not present

## 2021-10-03 DIAGNOSIS — R5383 Other fatigue: Secondary | ICD-10-CM | POA: Diagnosis not present

## 2021-10-03 DIAGNOSIS — R5381 Other malaise: Secondary | ICD-10-CM | POA: Diagnosis not present

## 2021-10-03 DIAGNOSIS — Z87891 Personal history of nicotine dependence: Secondary | ICD-10-CM | POA: Diagnosis not present

## 2021-10-03 DIAGNOSIS — R0602 Shortness of breath: Secondary | ICD-10-CM | POA: Diagnosis not present

## 2021-10-03 DIAGNOSIS — J029 Acute pharyngitis, unspecified: Secondary | ICD-10-CM | POA: Diagnosis present

## 2021-10-03 LAB — RESP PANEL BY RT-PCR (FLU A&B, COVID) ARPGX2
Influenza A by PCR: NEGATIVE
Influenza B by PCR: NEGATIVE
SARS Coronavirus 2 by RT PCR: NEGATIVE

## 2021-10-03 LAB — GROUP A STREP BY PCR: Group A Strep by PCR: DETECTED — AB

## 2021-10-03 MED ORDER — METHYLPREDNISOLONE 4 MG PO TBPK: ORAL_TABLET | ORAL | 0 refills | Status: DC

## 2021-10-03 MED ORDER — AZITHROMYCIN 250 MG PO TABS: ORAL_TABLET | ORAL | 0 refills | Status: DC

## 2021-10-03 MED ORDER — DEXAMETHASONE SODIUM PHOSPHATE 10 MG/ML IJ SOLN
10.0000 mg | Freq: Once | INTRAMUSCULAR | Status: AC
  Administered 2021-10-03: 10 mg via INTRAMUSCULAR
  Filled 2021-10-03: qty 1

## 2021-10-03 MED ORDER — KETOROLAC TROMETHAMINE 60 MG/2ML IM SOLN
60.0000 mg | Freq: Once | INTRAMUSCULAR | Status: AC
  Administered 2021-10-03: 60 mg via INTRAMUSCULAR
  Filled 2021-10-03: qty 2

## 2021-10-03 MED ORDER — NAPROXEN 375 MG PO TABS: 375.0000 mg | ORAL_TABLET | Freq: Two times a day (BID) | ORAL | 0 refills | Status: DC

## 2021-10-03 NOTE — Discharge Instructions (Addendum)
You have been diagnosed with strep throat.  You will be treated with an antibiotic, anti-inflammatory medication and a steroid.  This should help reduce the swelling and pain in your throat.  Please also take Tylenol if necessary. Contact a health care provider if: You have swelling in your neck that keeps getting bigger. You develop a rash, cough, or earache. You cough up a thick mucus that is green, yellow-brown, or bloody. You have pain or discomfort that does not get better with medicine. Your symptoms seem to be getting worse. You have a fever. Get help right away if: You have new symptoms, such as vomiting, severe headache, stiff or painful neck, chest pain, or shortness of breath. You have severe throat pain, drooling, or changes in your voice. You have swelling of the neck, or the skin on the neck becomes red and tender. You have signs of dehydration, such as tiredness (fatigue), dry mouth, and decreased urination. You become increasingly sleepy, or you cannot wake up completely. Your joints become red or painful.

## 2021-10-03 NOTE — ED Triage Notes (Signed)
Pt reports sob with sore throat.  Reports swelling to throat.  Denies fever and cough.
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
## 2021-10-14 ENCOUNTER — Other Ambulatory Visit: Payer: No Typology Code available for payment source | Admitting: Adult Health

## 2021-10-15 ENCOUNTER — Ambulatory Visit: Payer: PRIVATE HEALTH INSURANCE | Admitting: Orthopedic Surgery

## 2021-10-15 ENCOUNTER — Encounter: Payer: Self-pay | Admitting: Orthopedic Surgery

## 2021-11-19 ENCOUNTER — Emergency Department (HOSPITAL_COMMUNITY)
Admission: EM | Admit: 2021-11-19 | Discharge: 2021-11-19 | Disposition: A | Discharge status: Home / Self Care | Payer: No Typology Code available for payment source | Attending: Emergency Medicine | Admitting: Emergency Medicine

## 2021-11-19 ENCOUNTER — Emergency Department (HOSPITAL_COMMUNITY): Payer: No Typology Code available for payment source

## 2021-11-19 ENCOUNTER — Encounter (HOSPITAL_COMMUNITY): Payer: Self-pay

## 2021-11-19 DIAGNOSIS — R0789 Other chest pain: Secondary | ICD-10-CM | POA: Diagnosis not present

## 2021-11-19 DIAGNOSIS — M546 Pain in thoracic spine: Secondary | ICD-10-CM | POA: Insufficient documentation

## 2021-11-19 DIAGNOSIS — Z87891 Personal history of nicotine dependence: Secondary | ICD-10-CM | POA: Diagnosis not present

## 2021-11-19 DIAGNOSIS — M25512 Pain in left shoulder: Secondary | ICD-10-CM | POA: Diagnosis not present

## 2021-11-19 DIAGNOSIS — R079 Chest pain, unspecified: Secondary | ICD-10-CM | POA: Diagnosis present

## 2021-11-19 LAB — CBC WITH DIFFERENTIAL/PLATELET
Abs Immature Granulocytes: 0.02 10*3/uL (ref 0.00–0.07)
Basophils Absolute: 0 10*3/uL (ref 0.0–0.1)
Basophils Relative: 0 %
Eosinophils Absolute: 0 10*3/uL (ref 0.0–0.5)
Eosinophils Relative: 0 %
HCT: 41 % (ref 36.0–46.0)
Hemoglobin: 13.5 g/dL (ref 12.0–15.0)
Immature Granulocytes: 0 %
Lymphocytes Relative: 22 %
Lymphs Abs: 2.2 10*3/uL (ref 0.7–4.0)
MCH: 30.8 pg (ref 26.0–34.0)
MCHC: 32.9 g/dL (ref 30.0–36.0)
MCV: 93.4 fL (ref 80.0–100.0)
Monocytes Absolute: 0.8 10*3/uL (ref 0.1–1.0)
Monocytes Relative: 8 %
Neutro Abs: 6.8 10*3/uL (ref 1.7–7.7)
Neutrophils Relative %: 70 %
Platelets: 231 10*3/uL (ref 150–400)
RBC: 4.39 MIL/uL (ref 3.87–5.11)
RDW: 11.9 % (ref 11.5–15.5)
WBC: 9.8 10*3/uL (ref 4.0–10.5)
nRBC: 0 % (ref 0.0–0.2)

## 2021-11-19 LAB — BASIC METABOLIC PANEL
Anion gap: 6 (ref 5–15)
BUN: 8 mg/dL (ref 6–20)
CO2: 26 mmol/L (ref 22–32)
Calcium: 9.3 mg/dL (ref 8.9–10.3)
Chloride: 105 mmol/L (ref 98–111)
Creatinine, Ser: 0.68 mg/dL (ref 0.44–1.00)
GFR, Estimated: >60 mL/min (ref >60)
Glucose, Bld: 68 mg/dL — ABNORMAL LOW (ref 70–99)
Potassium: 3.9 mmol/L (ref 3.5–5.1)
Sodium: 137 mmol/L (ref 135–145)

## 2021-11-19 LAB — TROPONIN I (HIGH SENSITIVITY)
Troponin I (High Sensitivity): <2 ng/L (ref <18)
Troponin I (High Sensitivity): <2 ng/L (ref <18)

## 2021-11-19 MED ORDER — IBUPROFEN 400 MG PO TABS
600.0000 mg | ORAL_TABLET | Freq: Once | ORAL | Status: AC
  Administered 2021-11-19: 10:00:00 600 mg via ORAL
  Filled 2021-11-19: qty 2

## 2021-11-19 MED ORDER — NAPROXEN 375 MG PO TABS: 375.0000 mg | ORAL_TABLET | Freq: Two times a day (BID) | ORAL | 0 refills | Status: DC

## 2021-11-19 NOTE — ED Triage Notes (Signed)
Pt has chest pain since this morning, pain in left side chest under breast into back and neck  reports 10/10 pain

## 2021-11-19 NOTE — ED Notes (Signed)
Ekg completed upon arrival
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
## 2021-12-04 ENCOUNTER — Other Ambulatory Visit: Payer: No Typology Code available for payment source | Admitting: Adult Health

## 2022-01-13 ENCOUNTER — Other Ambulatory Visit: Payer: No Typology Code available for payment source | Admitting: Adult Health

## 2022-04-30 ENCOUNTER — Other Ambulatory Visit: Payer: Self-pay | Admitting: Orthopedic Surgery

## 2022-04-30 DIAGNOSIS — M545 Low back pain, unspecified: Secondary | ICD-10-CM

## 2022-05-02 ENCOUNTER — Emergency Department
Admission: EM | Admit: 2022-05-02 | Discharge: 2022-05-02 | Disposition: A | Discharge status: Home / Self Care | Payer: Medicaid Other | Attending: Emergency Medicine | Admitting: Emergency Medicine

## 2022-05-02 ENCOUNTER — Other Ambulatory Visit: Payer: Self-pay

## 2022-05-02 ENCOUNTER — Emergency Department: Payer: Medicaid Other

## 2022-05-02 DIAGNOSIS — R0602 Shortness of breath: Secondary | ICD-10-CM | POA: Diagnosis present

## 2022-05-02 DIAGNOSIS — J189 Pneumonia, unspecified organism: Secondary | ICD-10-CM | POA: Insufficient documentation

## 2022-05-02 LAB — CBC
HCT: 40.7 % (ref 36.0–46.0)
Hemoglobin: 13.3 g/dL (ref 12.0–15.0)
MCH: 30.5 pg (ref 26.0–34.0)
MCHC: 32.7 g/dL (ref 30.0–36.0)
MCV: 93.3 fL (ref 80.0–100.0)
Platelets: 226 10*3/uL (ref 150–400)
RBC: 4.36 MIL/uL (ref 3.87–5.11)
RDW: 12.2 % (ref 11.5–15.5)
WBC: 15.2 10*3/uL — ABNORMAL HIGH (ref 4.0–10.5)
nRBC: 0 % (ref 0.0–0.2)

## 2022-05-02 LAB — COMPREHENSIVE METABOLIC PANEL
ALT: 12 U/L (ref 0–44)
AST: 19 U/L (ref 15–41)
Albumin: 3.9 g/dL (ref 3.5–5.0)
Alkaline Phosphatase: 71 U/L (ref 38–126)
Anion gap: 8 (ref 5–15)
BUN: 9 mg/dL (ref 6–20)
CO2: 27 mmol/L (ref 22–32)
Calcium: 9.3 mg/dL (ref 8.9–10.3)
Chloride: 103 mmol/L (ref 98–111)
Creatinine, Ser: 0.66 mg/dL (ref 0.44–1.00)
GFR, Estimated: >60 mL/min (ref >60)
Glucose, Bld: 137 mg/dL — ABNORMAL HIGH (ref 70–99)
Potassium: 3.8 mmol/L (ref 3.5–5.1)
Sodium: 138 mmol/L (ref 135–145)
Total Bilirubin: 0.4 mg/dL (ref 0.3–1.2)
Total Protein: 7.2 g/dL (ref 6.5–8.1)

## 2022-05-02 LAB — POC URINE PREG, ED: Preg Test, Ur: NEGATIVE

## 2022-05-02 LAB — TROPONIN I (HIGH SENSITIVITY): Troponin I (High Sensitivity): 3 ng/L (ref <18)

## 2022-05-02 MED ORDER — LORAZEPAM 0.5 MG PO TABS: 0.5000 mg | ORAL_TABLET | Freq: Three times a day (TID) | ORAL | 0 refills | Status: DC | PRN

## 2022-05-02 MED ORDER — AZITHROMYCIN 250 MG PO TABS: ORAL_TABLET | ORAL | 0 refills | Status: AC

## 2022-05-02 MED ORDER — CEFDINIR 300 MG PO CAPS
300.0000 mg | ORAL_CAPSULE | Freq: Once | ORAL | Status: AC
  Administered 2022-05-02: 300 mg via ORAL
  Filled 2022-05-02: qty 1

## 2022-05-02 MED ORDER — AZITHROMYCIN 500 MG PO TABS
500.0000 mg | ORAL_TABLET | Freq: Once | ORAL | Status: AC
Start: 2022-05-02 — End: 2022-05-02
  Administered 2022-05-02: 500 mg via ORAL
  Filled 2022-05-02: qty 1

## 2022-05-02 MED ORDER — CEFDINIR 300 MG PO CAPS: 300.0000 mg | ORAL_CAPSULE | Freq: Two times a day (BID) | ORAL | 0 refills | Status: DC

## 2022-05-02 NOTE — ED Triage Notes (Signed)
Pt complains of pain between her shoulder blades, shortness of breath, low blood pressure yesterday, possibly hypoglycemia yesterday as well. Pt denies history of diabetes.

## 2022-05-02 NOTE — ED Provider Notes (Signed)
[facility] Provider Note    Event Date/Time   First MD Initiated Contact with Patient 05/02/22 [phone]     (approximate)   History   Shortness of Breath   HPI  [name] is a 36 y.o. female with a history of bipolar disorder, seizures, who presents with complaints of shortness of breath for approximately 2 days.  She reports has had a mild cough.  Does not think that she has had fevers.  No nasal congestion.  Does have fatigue.  No lower extremity swelling or calf pain.  No significant pleurisy.     Physical Exam   Triage Vital Signs: ED Triage Vitals  Enc Vitals Group     BP 05/02/22 0629 (!) 159/95     Pulse Rate 05/02/22 0629 80     Resp 05/02/22 0629 20     Temp 05/02/22 0629 98.2 F (36.8 C)     Temp Source 05/02/22 0629 Oral     SpO2 05/02/22 0629 95 %     Weight 05/02/22 0630 90.7 kg (200 lb)     Height 05/02/22 0630 1.6 m (5\' 3" )     Head Circumference --      Peak Flow --      Pain Score 05/02/22 0629 4     Pain Loc --      Pain Edu? --      Excl. in GC? --     Most recent vital signs: Vitals:   05/02/22 0629  BP: (!) 159/95  Pulse: 80  Resp: 20  Temp: 98.2 F (36.8 C)  SpO2: 95%     General: Awake, no distress.  CV:  Good peripheral perfusion.  Regular rate and rhythm Resp:  Normal effort.  Bibasilar Rales Abd:  No distention.  Other:  No lower extremity swelling   ED Results / Procedures / Treatments   Labs (all labs ordered are listed, but only abnormal results are displayed) Labs Reviewed  CBC - Abnormal; Notable for the following components:      Result Value   WBC 15.2 (*)    All other components within normal limits  COMPREHENSIVE METABOLIC PANEL - Abnormal; Notable for the following components:   Glucose, Bld 137 (*)    All other components within normal limits  POC URINE PREG, ED  TROPONIN I (HIGH SENSITIVITY)     EKG  ED ECG REPORT I, 07/02/22, the attending physician, personally  viewed and interpreted this ECG.  Date: 05/02/2022  Rhythm: normal sinus rhythm QRS Axis: normal Intervals: normal ST/T Wave abnormalities: normal Narrative Interpretation: no evidence of acute ischemia    RADIOLOGY Chest x-ray viewed interpreted by me, consistent with pneumonia    PROCEDURES:  Critical Care performed:   Procedures   MEDICATIONS ORDERED IN ED: Medications  cefdinir (OMNICEF) capsule 300 mg (has no administration in time range)  azithromycin (ZITHROMAX) tablet 500 mg (has no administration in time range)     IMPRESSION / MDM / ASSESSMENT AND PLAN / ED COURSE  I reviewed the triage vital signs and the nursing notes. Patient's presentation is most consistent with acute presentation with potential threat to life or bodily function.  Patient presents with shortness of breath as detailed above.  She reports 2 days of symptoms, mild cough.  Differential includes pneumonia, pneumothorax, less likely CHF/PE  Lab work reviewed, normal troponin, normal CMP, elevated white blood cell count of 15.2  Chest x-ray is consistent with pneumonia  Reviewed patient's  vitals, her blood pressure is normal on my exam 129/85, afebrile, normal heart rate, 100% on room air  Considered admission however given reassuring vitals, work-up appropriate for oral antibiotics with strict return precautions  Discussed patient's penicillin allergy with her, she reports a history of seizure disorder, penicillin intake not consistent with causing seizures in the past and she admits that this was a childhood allergy  We will treat the patient with cefdinir and Z-Pak      FINAL CLINICAL IMPRESSION(S) / ED DIAGNOSES   Final diagnoses:  Community acquired pneumonia, unspecified laterality     Rx / DC Orders   ED Discharge Orders          Ordered    cefdinir (OMNICEF) 300 MG capsule  2 times daily        05/02/22 0800    azithromycin (ZITHROMAX Z-PAK) 250 MG tablet         05/02/22 0800             Note:  This document was prepared using Dragon voice recognition software and may include unintentional dictation errors.   Jene Every, MD 05/02/22 0800

## 2022-06-16 ENCOUNTER — Ambulatory Visit (INDEPENDENT_AMBULATORY_CARE_PROVIDER_SITE_OTHER): Payer: Medicaid Other | Admitting: Adult Health

## 2022-06-16 ENCOUNTER — Encounter: Payer: Self-pay | Admitting: Adult Health

## 2022-06-16 ENCOUNTER — Other Ambulatory Visit (HOSPITAL_COMMUNITY)
Admission: RE | Admit: 2022-06-16 | Discharge: 2022-06-16 | Disposition: A | Discharge status: Home / Self Care | Payer: Medicaid Other | Source: Ambulatory Visit | Attending: Adult Health | Admitting: Adult Health

## 2022-06-16 VITALS — BP 131/93 | HR 77 | Ht 63.0 in | Wt 207.5 lb

## 2022-06-16 DIAGNOSIS — F32A Depression, unspecified: Secondary | ICD-10-CM

## 2022-06-16 DIAGNOSIS — N898 Other specified noninflammatory disorders of vagina: Secondary | ICD-10-CM | POA: Insufficient documentation

## 2022-06-16 DIAGNOSIS — Z975 Presence of (intrauterine) contraceptive device: Secondary | ICD-10-CM | POA: Diagnosis not present

## 2022-06-16 DIAGNOSIS — F419 Anxiety disorder, unspecified: Secondary | ICD-10-CM

## 2022-06-16 DIAGNOSIS — R300 Dysuria: Secondary | ICD-10-CM | POA: Diagnosis not present

## 2022-06-16 HISTORY — DX: Depression, unspecified: F32.A

## 2022-06-16 HISTORY — DX: Anxiety disorder, unspecified: F41.9

## 2022-06-16 LAB — POCT URINALYSIS DIPSTICK
Blood, UA: NEGATIVE
Glucose, UA: NEGATIVE
Ketones, UA: NEGATIVE
Nitrite, UA: NEGATIVE
Protein, UA: NEGATIVE

## 2022-06-16 NOTE — Progress Notes (Signed)
  Subjective:     Patient ID: [name], female   DOB: 1986/02/05, 36 y.o.   MRN: [id number]  HPI Camree is a 36 year old white female,divorced, [id number] in complaining of vaginal discharge, burning with urination and  Lab Results  Component Value Date   DIAGPAP  07/16/2020    - Negative for intraepithelial lesion or malignancy (NILM)   HPVHIGH Negative 07/16/2020    Review of Systems +vaginal discharge Burning with urination at times. Had sex about 2 months ago  Reviewed past medical,surgical, social and family history. Reviewed medications and allergies.       Objective:   Physical Exam BP (!) 131/93 (BP Location: Left Arm, Patient Position: Sitting, Cuff Size: Normal)   Pulse 77   Ht 5\' 3"  (1.6 m)   Wt 207 lb 8 oz (94.1 kg)   BMI 36.76 kg/m  urine [place]ick trace leuks    kin warm and dry.Pelvic: external genitalia is normal in appearance no lesions, vagina: white discharge without odor,urethra has no lesions or masses noted, cervix:smooth and bulbous, +IUD strings uterus: normal size, shape and contour, non tender, no masses felt, adnexa: no masses or tenderness noted. Bladder is non tender and no masses felt. CV swab obtained.  Examination chaperoned by LPN    Malachy Mood   10:04 AM 07/16/2020    3:19 PM  Depression screen PHQ 2/9  Decreased Interest 2 3  Down, Depressed, Hopeless 2 3  PHQ - 2 Score 4 6  Altered sleeping 3 3  Tired, decreased energy 3 3  Change in appetite 3 0  Feeling bad or failure about yourself  3 3  Trouble concentrating 3 3  Moving slowly or fidgety/restless 3 0  Suicidal thoughts 0 0  PHQ-9 Score 22 18  Difficult doing work/chores Extremely dIfficult Very difficult       06/16/2022   10:07 AM 07/16/2020    3:21 PM  GAD 7 : Generalized Anxiety Score  Nervous, Anxious, on Edge 3 3  Control/stop worrying 3 3  Worry too much - different things 3 3  Trouble relaxing 3 3  Restless 3 3  Easily annoyed or irritable 3 3   Afraid - awful might happen 3 0  Total GAD 7 Score 21 18  Anxiety Difficulty  Very difficult      Upstream - 06/16/22 1003       Pregnancy Intention Screening   Does the patient want to become pregnant in the next year? No    Does the patient's partner want to become pregnant in the next year? No    Would the patient like to discuss contraceptive options today? No      Contraception Wrap Up   Current Method IUD or IUS    End Method IUD or IUS             Assessment:     1. Burning with urination Push fluids  - POCT Urinalysis Dipstick  2. Vaginal discharge CV swab sent for GC/CHL,trich,BV and yeast  - Cervicovaginal ancillary only( [place]) Will talke when results back  3. IUD (intrauterine device) in place Mirena placed 07/30/16   4. Anxiety and depression Continue meds from [facility], but she says they do not listen Will refer to [facility] - Ambulatory referral to Behavioral Health     Plan:     Return in 1 year of pap and physical

## 2022-06-17 ENCOUNTER — Other Ambulatory Visit: Payer: Self-pay | Admitting: Adult Health

## 2022-06-17 LAB — CERVICOVAGINAL ANCILLARY ONLY
Bacterial Vaginitis (gardnerella): POSITIVE — AB
Candida Glabrata: NEGATIVE
Candida Vaginitis: NEGATIVE
Chlamydia: NEGATIVE
Comment: NEGATIVE
Comment: NEGATIVE
Comment: NEGATIVE
Comment: NEGATIVE
Comment: NEGATIVE
Comment: NORMAL
Neisseria Gonorrhea: NEGATIVE
Trichomonas: NEGATIVE

## 2022-06-17 MED ORDER — METRONIDAZOLE 500 MG PO TABS: 500.0000 mg | ORAL_TABLET | Freq: Two times a day (BID) | ORAL | 0 refills | Status: DC

## 2022-06-17 NOTE — Progress Notes (Signed)
+  BV on vaginal swab will rx flagyl,no sex or alcohol while taking  ?

## 2022-07-06 ENCOUNTER — Telehealth: Payer: Self-pay | Admitting: Adult Health

## 2022-07-06 NOTE — Telephone Encounter (Signed)
Patient calling stating that she thought that a referral for behavioral  health was suppose to be put in but I don't see anything.

## 2022-07-11 ENCOUNTER — Emergency Department (HOSPITAL_COMMUNITY)
Admission: EM | Admit: 2022-07-11 | Discharge: 2022-07-11 | Disposition: A | Discharge status: Home / Self Care | Payer: Medicaid Other | Attending: Emergency Medicine | Admitting: Emergency Medicine

## 2022-07-11 ENCOUNTER — Encounter (HOSPITAL_COMMUNITY): Payer: Self-pay | Admitting: *Deleted

## 2022-07-11 ENCOUNTER — Emergency Department (HOSPITAL_COMMUNITY): Payer: Medicaid Other

## 2022-07-11 ENCOUNTER — Other Ambulatory Visit: Payer: Self-pay

## 2022-07-11 DIAGNOSIS — D72829 Elevated white blood cell count, unspecified: Secondary | ICD-10-CM | POA: Diagnosis not present

## 2022-07-11 DIAGNOSIS — J189 Pneumonia, unspecified organism: Secondary | ICD-10-CM

## 2022-07-11 DIAGNOSIS — J181 Lobar pneumonia, unspecified organism: Secondary | ICD-10-CM | POA: Diagnosis not present

## 2022-07-11 DIAGNOSIS — R112 Nausea with vomiting, unspecified: Secondary | ICD-10-CM | POA: Diagnosis present

## 2022-07-11 LAB — I-STAT BETA HCG BLOOD, ED (MC, WL, AP ONLY): I-stat hCG, quantitative: <5 m[IU]/mL (ref <5)

## 2022-07-11 LAB — CBC WITH DIFFERENTIAL/PLATELET
Abs Immature Granulocytes: 0.09 10*3/uL — ABNORMAL HIGH (ref 0.00–0.07)
Basophils Absolute: 0 10*3/uL (ref 0.0–0.1)
Basophils Relative: 0 %
Eosinophils Absolute: 0 10*3/uL (ref 0.0–0.5)
Eosinophils Relative: 0 %
HCT: 41.6 % (ref 36.0–46.0)
Hemoglobin: 14.2 g/dL (ref 12.0–15.0)
Immature Granulocytes: 0 %
Lymphocytes Relative: 4 %
Lymphs Abs: 0.9 10*3/uL (ref 0.7–4.0)
MCH: 31.7 pg (ref 26.0–34.0)
MCHC: 34.1 g/dL (ref 30.0–36.0)
MCV: 92.9 fL (ref 80.0–100.0)
Monocytes Absolute: 0.8 10*3/uL (ref 0.1–1.0)
Monocytes Relative: 4 %
Neutro Abs: 20.1 10*3/uL — ABNORMAL HIGH (ref 1.7–7.7)
Neutrophils Relative %: 92 %
Platelets: 217 10*3/uL (ref 150–400)
RBC: 4.48 MIL/uL (ref 3.87–5.11)
RDW: 12.1 % (ref 11.5–15.5)
WBC: 21.9 10*3/uL — ABNORMAL HIGH (ref 4.0–10.5)
nRBC: 0 % (ref 0.0–0.2)

## 2022-07-11 LAB — COMPREHENSIVE METABOLIC PANEL
ALT: 22 U/L (ref 0–44)
AST: 24 U/L (ref 15–41)
Albumin: 4.2 g/dL (ref 3.5–5.0)
Alkaline Phosphatase: 63 U/L (ref 38–126)
Anion gap: 7 (ref 5–15)
BUN: 9 mg/dL (ref 6–20)
CO2: 25 mmol/L (ref 22–32)
Calcium: 9 mg/dL (ref 8.9–10.3)
Chloride: 103 mmol/L (ref 98–111)
Creatinine, Ser: 0.66 mg/dL (ref 0.44–1.00)
GFR, Estimated: >60 mL/min (ref >60)
Glucose, Bld: 113 mg/dL — ABNORMAL HIGH (ref 70–99)
Potassium: 3.7 mmol/L (ref 3.5–5.1)
Sodium: 135 mmol/L (ref 135–145)
Total Bilirubin: 0.9 mg/dL (ref 0.3–1.2)
Total Protein: 7.5 g/dL (ref 6.5–8.1)

## 2022-07-11 LAB — LIPASE, BLOOD: Lipase: 25 U/L (ref 11–51)

## 2022-07-11 LAB — ETHANOL: Alcohol, Ethyl (B): <10 mg/dL (ref <10)

## 2022-07-11 MED ORDER — SUCRALFATE 1 GM/10ML PO SUSP
1.0000 g | Freq: Once | ORAL | Status: AC
  Administered 2022-07-11: 1 g via ORAL
  Filled 2022-07-11: qty 10

## 2022-07-11 MED ORDER — DOXYCYCLINE HYCLATE 100 MG PO CAPS: 100.0000 mg | ORAL_CAPSULE | Freq: Two times a day (BID) | ORAL | 0 refills | Status: DC

## 2022-07-11 MED ORDER — FAMOTIDINE 20 MG PO TABS: 20.0000 mg | ORAL_TABLET | Freq: Two times a day (BID) | ORAL | 0 refills | Status: DC

## 2022-07-11 MED ORDER — BARIUM SULFATE 2 % PO SUSP
ORAL | Status: AC
  Filled 2022-07-11: qty 1

## 2022-07-11 MED ORDER — SUCRALFATE 1 G PO TABS: 1.0000 g | ORAL_TABLET | Freq: Three times a day (TID) | ORAL | 0 refills | Status: DC

## 2022-07-11 MED ORDER — FAMOTIDINE IN NACL 20-0.9 MG/50ML-% IV SOLN
20.0000 mg | Freq: Once | INTRAVENOUS | Status: AC
Start: 2022-07-11 — End: 2022-07-11
  Administered 2022-07-11: 20 mg via INTRAVENOUS
  Filled 2022-07-11: qty 50

## 2022-07-11 MED ORDER — SODIUM CHLORIDE 0.9 % IV BOLUS
1000.0000 mL | Freq: Once | INTRAVENOUS | Status: AC
Start: 2022-07-11 — End: 2022-07-11
  Administered 2022-07-11: 1000 mL via INTRAVENOUS

## 2022-07-11 MED ORDER — IOHEXOL 300 MG/ML  SOLN
100.0000 mL | Freq: Once | INTRAMUSCULAR | Status: AC | PRN
  Administered 2022-07-11: 100 mL via INTRAVENOUS

## 2022-07-11 NOTE — ED Provider Notes (Signed)
[facility] EMERGENCY DEPARTMENT Provider Note   CSN: [id number] Arrival date & time: 07/11/22  1042     History  Chief Complaint  Patient presents with   Hematemesis    [name] is a 36 y.o. female.  HPI patient with history of bipolar disorder, pneumonia earlier this year presents with concern of an episode of nausea, vomiting.  She also notes a history of peptic ulcer disease.  Pain is sternal, not currently present, nonradiating, no lower abdominal pain, no fever, no diarrhea.  In regards to her ulcer disease she notes that she was diagnosed via ultrasound has not had GI follow-up or endoscopy.  She drinks infrequently.    Home Medications Prior to Admission medications   Medication Sig Start Date End Date Taking? Authorizing Provider  busPIRone (BUSPAR) 10 MG tablet Take 10 mg by mouth in the morning, at noon, and at bedtime. 08/12/21  Yes [provider]  citalopram (CELEXA) 20 MG tablet Take 40 mg by mouth daily. 01/01/20  Yes [provider]  docusate sodium (COLACE) 100 MG capsule Take 100 mg by mouth 2 (two) times daily.   Yes [provider]  doxycycline (VIBRAMYCIN) 100 MG capsule Take 1 capsule (100 mg total) by mouth 2 (two) times daily. 07/11/22  Yes Gerhard Munch, MD  famotidine (PEPCID) 20 MG tablet Take 1 tablet (20 mg total) by mouth 2 (two) times daily. 07/11/22  Yes Gerhard Munch, MD  ferrous sulfate 325 (65 FE) MG tablet Take 650 mg by mouth daily with breakfast.   Yes [provider]  gabapentin (NEURONTIN) 100 MG capsule TAKE 1 CAPSULE(100 MG) BY MOUTH THREE TIMES DAILY Patient taking differently: Take 100 mg by mouth 3 (three) times daily. 04/30/22  Yes Vickki Hearing, MD  levonorgestrel (MIRENA) 20 MCG/DAY IUD 1 each by Intrauterine route once.   Yes [provider]  sucralfate (CARAFATE) 1 g tablet Take 1 tablet (1 g total) by mouth 4 (four) times daily -  with meals and at bedtime. 07/11/22  Yes  Gerhard Munch, MD  traZODone (DESYREL) 100 MG tablet Take 100 mg by mouth at bedtime. 08/12/21  Yes [provider]      Allergies    Penicillin g, Penicillins, and Sulfa antibiotics    Review of Systems   Review of Systems  All other systems reviewed and are negative.   Physical Exam Updated Vital Signs BP 125/76   Pulse 85   Temp 98.4 F (36.9 C) (Oral)   Resp 18   Ht 5\' 3"  (1.6 m)   Wt 90.7 kg   SpO2 99%   BMI 35.43 kg/m  Physical Exam Vitals and nursing note reviewed.  Constitutional:      General: She is not in acute distress.    Appearance: She is well-developed.  HENT:     Head: Normocephalic and atraumatic.  Eyes:     Conjunctiva/sclera: Conjunctivae normal.  Cardiovascular:     Rate and Rhythm: Normal rate and regular rhythm.  Pulmonary:     Effort: Pulmonary effort is normal. No respiratory distress.     Breath sounds: Normal breath sounds. No stridor.  Chest:    Abdominal:     General: There is no distension.     Tenderness: There is no abdominal tenderness. There is no guarding.  Skin:    General: Skin is warm and dry.  Neurological:     Mental Status: She is alert and oriented to person, place, and time.  Cranial Nerves: No cranial nerve deficit.  Psychiatric:        Mood and Affect: Mood normal.     ED Results / Procedures / Treatments   Labs (all labs ordered are listed, but only abnormal results are displayed) Labs Reviewed  COMPREHENSIVE METABOLIC PANEL - Abnormal; Notable for the following components:      Result Value   Glucose, Bld 113 (*)    All other components within normal limits  CBC WITH DIFFERENTIAL/PLATELET - Abnormal; Notable for the following components:   WBC 21.9 (*)    Neutro Abs 20.1 (*)    Abs Immature Granulocytes 0.09 (*)    All other components within normal limits  ETHANOL  LIPASE, BLOOD  I-STAT BETA HCG BLOOD, ED ([facility], [facility], [facility] ONLY)    EKG None  Radiology CT Abdomen Pelvis W  Contrast  Result Date: 07/11/2022 CLINICAL DATA:  Nausea and vomiting. Evaluate for bowel perforation and/or peritonitis. EXAM: CT ABDOMEN AND PELVIS WITH CONTRAST TECHNIQUE: Multidetector CT imaging of the abdomen and pelvis was performed using the standard protocol following bolus administration of intravenous contrast. RADIATION DOSE REDUCTION: This exam was performed according to the departmental dose-optimization program which includes automated exposure control, adjustment of the mA and/or kV according to patient size and/or use of iterative reconstruction technique. CONTRAST:  OMNIPAQUE IOHEXOL 300 MG/ML  SOLN COMPARISON:  08/26/2008 FINDINGS: Lower chest: Patchy ground-glass and airspace densities are noted within the right middle lobe and right lower lobe concerning for inflammatory or infectious process. Trace pleural fluid noted overlying the posterior left lung base. Hepatobiliary: No suspicious liver abnormality. Gallbladder appears normal. Pancreas: Unremarkable. No pancreatic ductal dilatation or surrounding inflammatory changes. Spleen: Normal in size without focal abnormality. Adrenals/Urinary Tract: Normal adrenal glands. No nephrolithiasis or mass identified bilaterally. No hydronephrosis. Urinary bladder appears normal for degree of distension. Stomach/Bowel: Small hiatal hernia. Stomach is otherwise normal. The appendix is visualized and appears normal. There is no bowel wall thickening, inflammation, or distension. Vascular/Lymphatic: Normal appearance of the abdominal aorta. No abdominopelvic adenopathy. Reproductive: Uterus appears normal with IUD in place. Normal physiologic appearance of the ovaries. Cyst on the right ovary measures 1.4 cm. Left ovary cyst measures 1.2 cm. No follow-up imaging recommended. Other: No free fluid or fluid collections. No signs of pneumoperitoneum. Musculoskeletal: No acute or significant osseous findings. IMPRESSION: 1. No acute findings within the  abdomen or pelvis. No evidence for bowel perforation. 2. Patchy ground-glass and airspace densities are noted within the right middle lobe and right lower lobe concerning for inflammatory or infectious process. Electronically Signed   By: Signa Kell M.D.   On: 07/11/2022 15:52   DG Chest 2 View  Result Date: 07/11/2022 CLINICAL DATA:  Leukocytosis, recent pneumonia. Concern for recurrence. EXAM: CHEST - 2 VIEW COMPARISON:  Chest radiograph dated May 02, 2022 FINDINGS: The heart size and mediastinal contours are within normal limits. Both lungs are clear without evidence of focal consolidation or pleural effusion. The visualized skeletal structures are unremarkable. IMPRESSION: No active cardiopulmonary disease. Electronically Signed   By: Larose Hires D.O.   On: 07/11/2022 13:07    Procedures Procedures    Medications Ordered in ED Medications  barium (READI-CAT 2) 2 % suspension (has no administration in time range)  sodium chloride 0.9 % bolus 1,000 mL (0 mLs Intravenous Stopped 07/11/22 1414)  famotidine (PEPCID) IVPB 20 mg premix (0 mg Intravenous Stopped 07/11/22 1414)  sucralfate (CARAFATE) 1 GM/10ML suspension 1 g (1 g Oral Given  07/11/22 1142)  iohexol (OMNIPAQUE) 300 MG/ML solution 100 mL (100 mLs Intravenous Contrast Given 07/11/22 1533)    ED Course/ Medical Decision Making/ A&P This patient with a Hx of bipolar disorder, pneumonia, peptic ulcer disease presents to the ED for concern of, vomiting, sternal discomfort and possible hematemesis, this involves an extensive number of treatment options, and is a complaint that carries with it a high risk of complications and morbidity.    The differential diagnosis includes progression of peptic ulcer disease, pneumonia, gastritis, esophagitis, bacteremia, sepsis   Social Determinants of Health:  Occasional smoking and alcohol use as well as psychiatric disease  Additional history obtained:  Additional history and/or information  obtained from review, notable for ED visit June of this year with x-ray suggesting pneumonia   After the initial evaluation, orders, including: X-ray labs fluids Pepcid Carafate were initiated.   Patient placed on Cardiac and Pulse-Oximetry Monitors. The patient was maintained on a cardiac monitor.  The cardiac monitored showed an rhythm of 80 sinus normal The patient was also maintained on pulse oximetry. The readings were typically 100% room air normal   On repeat evaluation of the patient stayed the same  Lab Tests:  I personally interpreted labs.  The pertinent results include: Leukocytosis, greater than 20,000  Imaging Studies ordered:  I independently visualized and interpreted imaging which showed lower lobe infiltrate versus scarring I agree with the radiologist interpretation  4:11 PMDispostion / Final MDM: Patient awake, alert, no hypoxia.  We discussed all findings at length.  Leukocytosis, possible infiltrate on x-ray, but no intra-abdominal processes there is some suspicion for pneumonia versus inflammation versus gastritis contributing to her symptoms.  No evidence for bacteremia or sepsis with no hypotension, no tachycardia.  Patient comfortable with outpatient follow-up, appropriate for this.      Final Clinical Impression(s) / ED Diagnoses Final diagnoses:  Community acquired pneumonia of right lower lobe of lung    Rx / DC Orders ED Discharge Orders          Ordered    famotidine (PEPCID) 20 MG tablet  2 times daily        07/11/22 1611    sucralfate (CARAFATE) 1 g tablet  3 times daily with meals & bedtime       Note to Pharmacy: Take for one week   07/11/22 1611    doxycycline (VIBRAMYCIN) 100 MG capsule  2 times daily        07/11/22 1611              Carmin Muskrat, MD 07/11/22 1611

## 2022-07-11 NOTE — ED Triage Notes (Signed)
Pt states she started vomiting black emesis today and states she feels very weak;  Pt c/o chills  Pt takes iron supplements everyday

## 2022-07-11 NOTE — Discharge Instructions (Signed)
As discussed, today's evaluation is suggested there may be inflammation or infection in your lungs contributing to your symptoms.  Please take all medication as directed and follow-up with both your primary care physician and our gastroenterology colleagues given your history of ulcer disease.

## 2022-07-11 NOTE — ED Notes (Signed)
Pt updated on plan of care at this time.  

## 2022-07-28 ENCOUNTER — Ambulatory Visit (INDEPENDENT_AMBULATORY_CARE_PROVIDER_SITE_OTHER): Payer: Medicaid Other | Admitting: Family Medicine

## 2022-07-28 ENCOUNTER — Encounter: Payer: Self-pay | Admitting: Family Medicine

## 2022-07-28 ENCOUNTER — Other Ambulatory Visit: Payer: Self-pay | Admitting: Family Medicine

## 2022-07-28 VITALS — BP 131/86 | HR 71 | Ht 63.0 in | Wt 210.1 lb

## 2022-07-28 DIAGNOSIS — E669 Obesity, unspecified: Secondary | ICD-10-CM

## 2022-07-28 DIAGNOSIS — J189 Pneumonia, unspecified organism: Secondary | ICD-10-CM

## 2022-07-28 DIAGNOSIS — J13 Pneumonia due to Streptococcus pneumoniae: Secondary | ICD-10-CM | POA: Diagnosis not present

## 2022-07-28 DIAGNOSIS — Z23 Encounter for immunization: Secondary | ICD-10-CM | POA: Diagnosis not present

## 2022-07-28 DIAGNOSIS — K279 Peptic ulcer, site unspecified, unspecified as acute or chronic, without hemorrhage or perforation: Secondary | ICD-10-CM | POA: Diagnosis not present

## 2022-07-28 DIAGNOSIS — Z1159 Encounter for screening for other viral diseases: Secondary | ICD-10-CM | POA: Diagnosis not present

## 2022-07-28 DIAGNOSIS — E559 Vitamin D deficiency, unspecified: Secondary | ICD-10-CM | POA: Diagnosis not present

## 2022-07-28 DIAGNOSIS — R7301 Impaired fasting glucose: Secondary | ICD-10-CM

## 2022-07-28 MED ORDER — SUCRALFATE 1 G PO TABS: 1.0000 g | ORAL_TABLET | Freq: Three times a day (TID) | ORAL | 1 refills | Status: DC

## 2022-07-28 MED ORDER — OMEPRAZOLE 20 MG PO CPDR: 20.0000 mg | DELAYED_RELEASE_CAPSULE | Freq: Every day | ORAL | 3 refills | Status: DC

## 2022-07-28 NOTE — Patient Instructions (Addendum)
I appreciate the opportunity to provide care to you today!    Follow up:  1 months weight management  Labs: please stop by the lab today to get your blood drawn (CBC, CMP, TSH, Lipid profile, HgA1c, Vit D)  Screening:  Hep C  Please stop by [facility] hospital anytime to get an x-ray the chest on 08/22/22 to assess for clearing of your pneumonia  Please pick up your medications at the pharmacy     Please continue to a heart-healthy diet and increase your physical activities. Try to exercise for at least three times a week.      It was a pleasure to see you and I look forward to continuing to work together on your health and well-being. Please do not hesitate to call the office if you need care or have questions about your care.   Have a wonderful day and week. With Gratitude, Gilmore Laroche MSN, FNP-BC
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
## 2022-07-29 ENCOUNTER — Encounter: Payer: Self-pay | Admitting: Family Medicine

## 2022-07-29 ENCOUNTER — Other Ambulatory Visit: Payer: Self-pay | Admitting: Family Medicine

## 2022-07-29 DIAGNOSIS — E559 Vitamin D deficiency, unspecified: Secondary | ICD-10-CM

## 2022-07-29 LAB — CMP14+EGFR
ALT: 15 IU/L (ref 0–32)
AST: 21 IU/L (ref 0–40)
Albumin/Globulin Ratio: 2 (ref 1.2–2.2)
Albumin: 4.7 g/dL (ref 3.9–4.9)
Alkaline Phosphatase: 88 IU/L (ref 44–121)
BUN/Creatinine Ratio: 10 (ref 9–23)
BUN: 8 mg/dL (ref 6–20)
Bilirubin Total: <0.2 mg/dL (ref 0.0–1.2)
CO2: 24 mmol/L (ref 20–29)
Calcium: 9.1 mg/dL (ref 8.7–10.2)
Chloride: 101 mmol/L (ref 96–106)
Creatinine, Ser: 0.78 mg/dL (ref 0.57–1.00)
Globulin, Total: 2.4 g/dL (ref 1.5–4.5)
Glucose: 82 mg/dL (ref 70–99)
Potassium: 4.2 mmol/L (ref 3.5–5.2)
Sodium: 138 mmol/L (ref 134–144)
Total Protein: 7.1 g/dL (ref 6.0–8.5)
eGFR: 100 mL/min/{1.73_m2} (ref >59)

## 2022-07-29 LAB — CBC WITH DIFFERENTIAL/PLATELET
Basophils Absolute: 0 10*3/uL (ref 0.0–0.2)
Basos: 0 %
EOS (ABSOLUTE): 0.1 10*3/uL (ref 0.0–0.4)
Eos: 0 %
Hematocrit: 41.1 % (ref 34.0–46.6)
Hemoglobin: 13.7 g/dL (ref 11.1–15.9)
Immature Grans (Abs): 0 10*3/uL (ref 0.0–0.1)
Immature Granulocytes: 0 %
Lymphocytes Absolute: 3 10*3/uL (ref 0.7–3.1)
Lymphs: 25 %
MCH: 31.1 pg (ref 26.6–33.0)
MCHC: 33.3 g/dL (ref 31.5–35.7)
MCV: 93 fL (ref 79–97)
Monocytes Absolute: 0.8 10*3/uL (ref 0.1–0.9)
Monocytes: 6 %
Neutrophils Absolute: 8.4 10*3/uL — ABNORMAL HIGH (ref 1.4–7.0)
Neutrophils: 69 %
Platelets: 261 10*3/uL (ref 150–450)
RBC: 4.41 x10E6/uL (ref 3.77–5.28)
RDW: 12.1 % (ref 11.7–15.4)
WBC: 12.3 10*3/uL — ABNORMAL HIGH (ref 3.4–10.8)

## 2022-07-29 LAB — HEMOGLOBIN A1C
Est. average glucose Bld gHb Est-mCnc: 100 mg/dL
Hgb A1c MFr Bld: 5.1 % (ref 4.8–5.6)

## 2022-07-29 LAB — HEPATITIS C ANTIBODY: Hep C Virus Ab: NONREACTIVE

## 2022-07-29 LAB — LIPID PANEL
Chol/HDL Ratio: 4.4 ratio (ref 0.0–4.4)
Cholesterol, Total: 167 mg/dL (ref 100–199)
HDL: 38 mg/dL — ABNORMAL LOW (ref >39)
LDL Chol Calc (NIH): 107 mg/dL — ABNORMAL HIGH (ref 0–99)
Triglycerides: 119 mg/dL (ref 0–149)
VLDL Cholesterol Cal: 22 mg/dL (ref 5–40)

## 2022-07-29 LAB — TSH+FREE T4
Free T4: 1.12 ng/dL (ref 0.82–1.77)
TSH: 1.99 u[IU]/mL (ref 0.450–4.500)

## 2022-07-29 LAB — VITAMIN D 25 HYDROXY (VIT D DEFICIENCY, FRACTURES): Vit D, 25-Hydroxy: 20.1 ng/mL — ABNORMAL LOW (ref 30.0–100.0)

## 2022-07-29 MED ORDER — VITAMIN D (ERGOCALCIFEROL) 1.25 MG (50000 UNIT) PO CAPS: 50000.0000 [IU] | ORAL_CAPSULE | ORAL | 2 refills | Status: DC

## 2022-07-30 DIAGNOSIS — E669 Obesity, unspecified: Secondary | ICD-10-CM | POA: Insufficient documentation

## 2022-07-30 DIAGNOSIS — J189 Pneumonia, unspecified organism: Secondary | ICD-10-CM | POA: Insufficient documentation

## 2022-07-30 DIAGNOSIS — K279 Peptic ulcer, site unspecified, unspecified as acute or chronic, without hemorrhage or perforation: Secondary | ICD-10-CM | POA: Insufficient documentation

## 2022-07-30 MED ORDER — SUCRALFATE 1 G PO TABS: 1.0000 g | ORAL_TABLET | Freq: Two times a day (BID) | ORAL | 1 refills | Status: DC

## 2022-07-30 NOTE — Assessment & Plan Note (Signed)
Refilled Carafate 1g and started pt on omeprazole 20mg  daily Will f/u after a month to assess the need for GI referral Advised to avoid NSAIDS

## 2022-07-30 NOTE — Assessment & Plan Note (Signed)
She has completed doxycycline 100 mg BID She reports feeling better but has not had a repeat imaging to indicate that the infection is cleared. No fever or cough was noted today Imaging studies were ordered to assess the clearing of pneumonia

## 2022-07-30 NOTE — Assessment & Plan Note (Signed)
-  Discuss lifestyle modifications, including healthy eating habits and increasing physical activities. - Also discuss intermittent fasting, weight loss pills, and injections -The patient will implement lifestyle changes and f/u in 1 month

## 2022-07-31 ENCOUNTER — Telehealth: Payer: Self-pay | Admitting: Family Medicine

## 2022-07-31 NOTE — Telephone Encounter (Signed)
Pt called stating she had sent a mychart message yesterday about her labs. She is wanting to know if you can please give her the lab results?

## 2022-08-03 ENCOUNTER — Other Ambulatory Visit: Payer: Self-pay | Admitting: Family Medicine

## 2022-08-03 NOTE — Telephone Encounter (Signed)
Pt informed of lab results. 

## 2022-08-03 NOTE — Telephone Encounter (Signed)
Patient called back in regard to lab results.

## 2022-08-03 NOTE — Progress Notes (Signed)
Please inform the patient that I've sent her a prescription for Vit D supplement to start taking. Her vitamin D is low.    Her cholesterol is mildly elevated; I want her cholesterol to be less than 100. I recommend increased physical activities, low carbs, and fat diet

## 2022-08-05 ENCOUNTER — Encounter (HOSPITAL_COMMUNITY): Payer: Self-pay | Admitting: Psychiatry

## 2022-08-05 ENCOUNTER — Telehealth: Payer: Self-pay

## 2022-08-05 ENCOUNTER — Telehealth (INDEPENDENT_AMBULATORY_CARE_PROVIDER_SITE_OTHER): Payer: Medicaid Other | Admitting: Psychiatry

## 2022-08-05 DIAGNOSIS — Z87820 Personal history of traumatic brain injury: Secondary | ICD-10-CM | POA: Diagnosis not present

## 2022-08-05 DIAGNOSIS — F410 Panic disorder [episodic paroxysmal anxiety] without agoraphobia: Secondary | ICD-10-CM

## 2022-08-05 DIAGNOSIS — F411 Generalized anxiety disorder: Secondary | ICD-10-CM | POA: Diagnosis not present

## 2022-08-05 DIAGNOSIS — G8929 Other chronic pain: Secondary | ICD-10-CM

## 2022-08-05 DIAGNOSIS — F17210 Nicotine dependence, cigarettes, uncomplicated: Secondary | ICD-10-CM

## 2022-08-05 DIAGNOSIS — F332 Major depressive disorder, recurrent severe without psychotic features: Secondary | ICD-10-CM

## 2022-08-05 DIAGNOSIS — Z79899 Other long term (current) drug therapy: Secondary | ICD-10-CM

## 2022-08-05 DIAGNOSIS — F17200 Nicotine dependence, unspecified, uncomplicated: Secondary | ICD-10-CM

## 2022-08-05 MED ORDER — DIVALPROEX SODIUM ER 250 MG PO TB24: 250.0000 mg | ORAL_TABLET | Freq: Every day | ORAL | 0 refills | Status: DC

## 2022-08-05 NOTE — Patient Instructions (Signed)
We added divalproex 250mg  ER nightly to your medications today. We will plan to check a blood level of this after the mychart check in a week from today.

## 2022-08-05 NOTE — Telephone Encounter (Signed)
Patient called was just seen 09.05.2023 told provider had phenomena still having pain in middle of back and chest, what else can patient do to help with having pneumonia. Please call patient

## 2022-08-05 NOTE — Telephone Encounter (Signed)
Please advise the patient to report to the ER if she has chest pain that radiates to the shoulders, arms, neck, jaw, and back, along with SOB chest tightness. I recommend Tylenol for her back pain and continue her omeprazole. Please advise the patient to get chest imaging to asses for pneumonia clearing. The order was placed at her last visit but has not yet been completed

## 2022-08-05 NOTE — Telephone Encounter (Signed)
Labs have been reviewed with patient.
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
## 2022-08-06 ENCOUNTER — Ambulatory Visit (HOSPITAL_COMMUNITY)
Admission: RE | Admit: 2022-08-06 | Discharge: 2022-08-06 | Disposition: A | Discharge status: Home / Self Care | Payer: Medicaid Other | Source: Ambulatory Visit | Attending: Family Medicine | Admitting: Family Medicine

## 2022-08-06 DIAGNOSIS — J189 Pneumonia, unspecified organism: Secondary | ICD-10-CM | POA: Diagnosis present

## 2022-08-07 ENCOUNTER — Telehealth: Payer: Self-pay | Admitting: Family Medicine

## 2022-08-07 ENCOUNTER — Other Ambulatory Visit: Payer: Self-pay | Admitting: Family Medicine

## 2022-08-07 DIAGNOSIS — J13 Pneumonia due to Streptococcus pneumoniae: Secondary | ICD-10-CM

## 2022-08-07 DIAGNOSIS — J189 Pneumonia, unspecified organism: Secondary | ICD-10-CM

## 2022-08-07 MED ORDER — METHYLPREDNISOLONE 4 MG PO TBPK: ORAL_TABLET | ORAL | 0 refills | Status: DC

## 2022-08-07 MED ORDER — AZITHROMYCIN 250 MG PO TABS: ORAL_TABLET | ORAL | 0 refills | Status: AC

## 2022-08-07 NOTE — Progress Notes (Signed)
Please inform the patient her X-ray shows that her pneumonia has not cleared. I've sent a prescription for azithromycin and oral steroids to start taking. I would also like her to get a repeat x-ray  during the week of 09/04/22

## 2022-08-07 NOTE — Telephone Encounter (Signed)
Pt informed of xray results

## 2022-08-07 NOTE — Telephone Encounter (Signed)
Called in regard to CT results wants a call back to go over

## 2022-08-12 ENCOUNTER — Telehealth (HOSPITAL_COMMUNITY): Payer: Self-pay

## 2022-08-12 DIAGNOSIS — F5104 Psychophysiologic insomnia: Secondary | ICD-10-CM

## 2022-08-12 MED ORDER — TRAZODONE HCL 100 MG PO TABS: 100.0000 mg | ORAL_TABLET | Freq: Every day | ORAL | 1 refills | Status: DC

## 2022-08-12 NOTE — Telephone Encounter (Addendum)
Pt asking for refill on Trazadone. She states that Dr. Nehemiah Settle is now filling this RX since she no longer goes to [facility]. She is completely out of medication. Last appt 08/05/22 next appt 08/17/22. Please sent to [facility], [place], [place]   Dr. Nehemiah Settle called Trazodone in to [facility], 100mg  nightly, 60d supply. 08/12/22

## 2022-08-13 ENCOUNTER — Telehealth: Payer: Self-pay | Admitting: Family Medicine

## 2022-08-13 NOTE — Telephone Encounter (Signed)
Pt called stating she has just finished the abx & is still getting short of breath. She is wanting know if she can get the cxr now instead of waiting till 09-04-22.

## 2022-08-13 NOTE — Telephone Encounter (Signed)
Please advise 

## 2022-08-14 ENCOUNTER — Emergency Department (HOSPITAL_COMMUNITY): Payer: Medicaid Other

## 2022-08-14 ENCOUNTER — Other Ambulatory Visit: Payer: Self-pay

## 2022-08-14 ENCOUNTER — Other Ambulatory Visit: Payer: Self-pay | Admitting: Family Medicine

## 2022-08-14 ENCOUNTER — Encounter (HOSPITAL_COMMUNITY): Payer: Self-pay

## 2022-08-14 ENCOUNTER — Emergency Department (HOSPITAL_COMMUNITY)
Admission: EM | Admit: 2022-08-14 | Discharge: 2022-08-14 | Disposition: A | Discharge status: Home / Self Care | Payer: Medicaid Other | Attending: Emergency Medicine | Admitting: Emergency Medicine

## 2022-08-14 DIAGNOSIS — R053 Chronic cough: Secondary | ICD-10-CM

## 2022-08-14 DIAGNOSIS — R0789 Other chest pain: Secondary | ICD-10-CM | POA: Diagnosis not present

## 2022-08-14 DIAGNOSIS — R0602 Shortness of breath: Secondary | ICD-10-CM

## 2022-08-14 DIAGNOSIS — J029 Acute pharyngitis, unspecified: Secondary | ICD-10-CM | POA: Diagnosis present

## 2022-08-14 MED ORDER — ALBUTEROL SULFATE HFA 108 (90 BASE) MCG/ACT IN AERS: 2.0000 | INHALATION_SPRAY | Freq: Four times a day (QID) | RESPIRATORY_TRACT | 0 refills | Status: DC | PRN

## 2022-08-14 NOTE — ED Provider Notes (Signed)
[facility] EMERGENCY DEPARTMENT Provider Note   CSN: [id number] Arrival date & time: 08/14/22  1059     History  Chief Complaint  Patient presents with   Sore Throat    [name] is a 36 y.o. female with history of anxiety, depression, bipolar disorder who presents emergency department complaining of sore throat and persistent chest soreness.  Patient states that she was initially diagnosed with pneumonia 3 months ago, and has been on several different courses of antibiotics.  Most recently finished a course yesterday, as well as a steroid taper.  She woke up this morning with a severe sore throat and difficulty swallowing.  No fever.  Has not taken anything for her symptoms today.   Sore Throat Pertinent negatives include no shortness of breath.       Home Medications Prior to Admission medications   Medication Sig Start Date End Date Taking? Authorizing Provider  albuterol (VENTOLIN HFA) 108 (90 Base) MCG/ACT inhaler Inhale 2 puffs into the lungs every 6 (six) hours as needed for wheezing or shortness of breath. 08/14/22   Alvira Monday, FNP  busPIRone (BUSPAR) 10 MG tablet Take 10 mg by mouth in the morning, at noon, and at bedtime. 08/12/21   [provider]  citalopram (CELEXA) 20 MG tablet Take 40 mg by mouth daily. 01/01/20   [provider]  divalproex (DEPAKOTE ER) 250 MG 24 hr tablet Take 1 tablet (250 mg total) by mouth daily. 08/05/22 09/04/22  Jacquelynn Cree, MD  ferrous sulfate 325 (65 FE) MG tablet Take 650 mg by mouth daily with breakfast.    [provider]  gabapentin (NEURONTIN) 100 MG capsule TAKE 1 CAPSULE(100 MG) BY MOUTH THREE TIMES DAILY Patient taking differently: Take 100 mg by mouth 3 (three) times daily. 04/30/22   Carole Civil, MD  levonorgestrel (MIRENA) 20 MCG/DAY IUD 1 each by Intrauterine route once.    [provider]  methylPREDNISolone (MEDROL DOSEPAK) 4 MG TBPK tablet Take as the package  instructed 08/07/22   Alvira Monday, FNP  omeprazole (PRILOSEC) 20 MG capsule Take 1 capsule (20 mg total) by mouth daily. 07/28/22   Alvira Monday, FNP  sucralfate (CARAFATE) 1 g tablet TAKE 1 TABLET(1 GRAM) BY MOUTH FOUR TIMES DAILY AT BEDTIME WITH MEALS 07/28/22   Alvira Monday, FNP  sucralfate (CARAFATE) 1 g tablet Take 1 tablet (1 g total) by mouth 2 (two) times daily. 07/30/22   Alvira Monday, FNP  traZODone (DESYREL) 100 MG tablet Take 1 tablet (100 mg total) by mouth at bedtime. 08/12/22 10/11/22  Jacquelynn Cree, MD  Vitamin D, Ergocalciferol, (DRISDOL) 1.25 MG (50000 UNIT) CAPS capsule Take 1 capsule (50,000 Units total) by mouth every 7 (seven) days. 07/29/22   Alvira Monday, FNP      Allergies    Penicillin g, Penicillins, and Sulfa antibiotics    Review of Systems   Review of Systems  Constitutional:  Negative for fever.  HENT:  Positive for sore throat and trouble swallowing. Negative for congestion.   Respiratory:  Positive for cough. Negative for shortness of breath.   All other systems reviewed and are negative.   Physical Exam Updated Vital Signs BP (!) 146/70   Pulse 67   Temp 98.2 F (36.8 C) (Oral)   Resp 16   Ht 5\' 3"  (1.6 m)   Wt 95.3 kg   SpO2 100%   BMI 37.20 kg/m  Physical Exam Vitals and nursing note reviewed.  Constitutional:  Appearance: Normal appearance.  HENT:     Head: Normocephalic and atraumatic.     Mouth/Throat:     Lips: Pink.     Mouth: Mucous membranes are moist.     Pharynx: Uvula midline. Posterior oropharyngeal erythema present.     Tonsils: Tonsillar exudate present. No tonsillar abscesses. 2+ on the right. 2+ on the left.  Eyes:     Conjunctiva/sclera: Conjunctivae normal.  Cardiovascular:     Rate and Rhythm: Normal rate and regular rhythm.  Pulmonary:     Effort: Pulmonary effort is normal. No respiratory distress.     Breath sounds: Normal breath sounds.  Abdominal:     General: There is no distension.      Palpations: Abdomen is soft.     Tenderness: There is no abdominal tenderness.  Skin:    General: Skin is warm and dry.  Neurological:     General: No focal deficit present.     Mental Status: She is alert.     ED Results / Procedures / Treatments   Labs (all labs ordered are listed, but only abnormal results are displayed) Labs Reviewed - No data to display  EKG None  Radiology DG Chest Portable 1 View  Result Date: 08/14/2022 CLINICAL DATA:  Chest pain. EXAM: PORTABLE CHEST 1 VIEW COMPARISON:  August 06, 2022. FINDINGS: The heart size and mediastinal contours are within normal limits. Left lung is clear. Mild opacity seen laterally in right lung base which may represent atelectasis or infiltrate. The visualized skeletal structures are unremarkable. IMPRESSION: Mild opacity seen laterally in right lung base which may represent atelectasis or infiltrate. Electronically Signed   By: Lupita Raider M.D.   On: 08/14/2022 11:50    Procedures Procedures    Medications Ordered in ED Medications - No data to display  ED Course/ Medical Decision Making/ A&P                           Medical Decision Making Amount and/or Complexity of Data Reviewed Radiology: ordered.  This patient is a 36 y.o. female who presents to the ED for concern of sore throat. Persistent chest soreness after pneumonia tx.    Differential diagnoses prior to evaluation: Viral illness, strep pharyngitis, dental abscess, esophagitis  Past Medical History / Social History / Additional history: Chart reviewed. Pertinent results include: Most recently seen in the ER on 8/19 for nausea and vomiting.  At that time she did have a leukocytosis and possible infiltrate on her x-ray, was discharged with antibiotics for community-acquired pneumonia.  I been following up with her PCP, most recently on 9/15 they prescribed her Z-Pak and Medrol Dosepak.  Physical Exam: Physical exam performed. The pertinent findings  include: [employer], otherwise normal vital signs.  Afebrile.  In no respiratory distress.  Bilateral 2+ tonsillar swelling with white exudate, no abscess.  Imaging: Chest x-ray performed that showed mild opacity in the right lung base.  When I reviewed and interpreted these images compared to her previous x-ray on 9/14, I do not appreciate a significant difference.  Disposition: After consideration of the diagnostic results and the patients response to treatment, I feel that emergency department workup does not suggest an emergent condition requiring admission or immediate intervention beyond what has been performed at this time. The plan is: symptomatic treatment for likely viral pharyngitis/tonsillitis. Patient just finished antibiotic course for pneumonia, with good strep pneumonia coverage. Recommended following up with PCP about persistent  chest x-ray findings, as they had mentioned to her before there is a chance of underlying malignancy. She is requesting a disc of her x-rays so she can see a specialist. The patient is safe for discharge and has been instructed to return immediately for worsening symptoms, change in symptoms or any other concerns.         Final Clinical Impression(s) / ED Diagnoses Final diagnoses:  Sore throat  Chronic cough    Rx / DC Orders ED Discharge Orders     None      Portions of this report may have been transcribed using voice recognition software. Every effort was made to ensure accuracy; however, inadvertent computerized transcription errors may be present.    Jeanella Flattery 08/14/22 1356    Lonell Grandchild, MD 08/14/22 1625

## 2022-08-14 NOTE — Telephone Encounter (Signed)
I spoke with the patient and informed her to pick up her albuterol inhaler at the pharmacy

## 2022-08-14 NOTE — ED Triage Notes (Signed)
Pt c/o swollen throat and reported having pneumonia in June and chest still hurts from it.

## 2022-08-14 NOTE — Discharge Instructions (Addendum)
You were seen in the emergency department for sore throat.  As we discussed I think that your symptoms are likely related to a viral illness, which would not be surprising after being on several medications including steroids recently.  Your x-ray performed today does not appear to have changed much from last week.  We have printed these on a disc for you that you can take to either your doctor or another doctor if you so wish.  For your sore throat I recommend taking anti-inflammatories like ibuprofen as needed.  You can use Chloraseptic sprays or lozenges to give you some local relief.  Continue to monitor how you are doing and return to the emergency department for any new or worsening symptoms such as fevers, inability to swallow, or difficulty breathing.

## 2022-08-17 ENCOUNTER — Encounter (HOSPITAL_COMMUNITY): Payer: Self-pay | Admitting: Psychiatry

## 2022-08-17 ENCOUNTER — Telehealth (INDEPENDENT_AMBULATORY_CARE_PROVIDER_SITE_OTHER): Payer: Medicaid Other | Admitting: Psychiatry

## 2022-08-17 ENCOUNTER — Encounter (HOSPITAL_COMMUNITY): Payer: Self-pay

## 2022-08-17 DIAGNOSIS — F17210 Nicotine dependence, cigarettes, uncomplicated: Secondary | ICD-10-CM | POA: Diagnosis not present

## 2022-08-17 DIAGNOSIS — Z87820 Personal history of traumatic brain injury: Secondary | ICD-10-CM

## 2022-08-17 DIAGNOSIS — F332 Major depressive disorder, recurrent severe without psychotic features: Secondary | ICD-10-CM

## 2022-08-17 DIAGNOSIS — Z79899 Other long term (current) drug therapy: Secondary | ICD-10-CM

## 2022-08-17 DIAGNOSIS — F410 Panic disorder [episodic paroxysmal anxiety] without agoraphobia: Secondary | ICD-10-CM

## 2022-08-17 DIAGNOSIS — F17200 Nicotine dependence, unspecified, uncomplicated: Secondary | ICD-10-CM

## 2022-08-17 DIAGNOSIS — G8929 Other chronic pain: Secondary | ICD-10-CM

## 2022-08-17 DIAGNOSIS — F411 Generalized anxiety disorder: Secondary | ICD-10-CM | POA: Diagnosis not present

## 2022-08-17 MED ORDER — NICOTINE POLACRILEX 4 MG MT LOZG: 4.0000 mg | LOZENGE | Freq: Three times a day (TID) | OROMUCOSAL | 1 refills | Status: DC | PRN

## 2022-08-17 MED ORDER — NICOTINE 21 MG/24HR TD PT24: 21.0000 mg | MEDICATED_PATCH | Freq: Every day | TRANSDERMAL | 1 refills | Status: DC

## 2022-08-17 NOTE — Patient Instructions (Signed)
We added nicotine patches and lozenges to your regimen today. Be sure to remove the nicotine patch before going to sleep to prevent nightmares. Try to go by the clinic this week to get your valproic acid (depakote) level drawn this week; take the depakote between 8p-9p the night before you go so that we can get a 12hr level drawn the next morning. Dr. Nehemiah Settle has placed a referral for psychotherapy for you. Continue practicing the breathing exercises as well as challenging those worry thoughts in the meantime.
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
## 2022-08-18 ENCOUNTER — Encounter: Payer: Self-pay | Admitting: Family Medicine

## 2022-08-18 ENCOUNTER — Ambulatory Visit: Payer: Medicaid Other | Admitting: Family Medicine

## 2022-08-18 VITALS — BP 130/70 | HR 72 | Ht 63.0 in | Wt 211.0 lb

## 2022-08-18 DIAGNOSIS — Z122 Encounter for screening for malignant neoplasm of respiratory organs: Secondary | ICD-10-CM

## 2022-08-18 DIAGNOSIS — J189 Pneumonia, unspecified organism: Secondary | ICD-10-CM | POA: Diagnosis not present

## 2022-08-18 NOTE — Patient Instructions (Addendum)
I appreciate the opportunity to provide care to you today!    Follow up:  08/28/2022   Referrals today- Pulmonary   Please continue to a heart-healthy diet and increase your physical activities. Try to exercise for 86mins at least three times a week.      It was a pleasure to see you and I look forward to continuing to work together on your health and well-being. Please do not hesitate to call the office if you need care or have questions about your care.   Have a wonderful day and week. With Gratitude, Alvira Monday MSN, FNP-BC

## 2022-08-18 NOTE — Assessment & Plan Note (Signed)
The patient is not a candidate for lung cancer screening because of her age Will place a referral to pulmonary for further evaluation, given the patient's smoking history

## 2022-08-18 NOTE — Progress Notes (Signed)
   Acute Office Visit  Subjective:     Patient ID: [name], female    DOB: 26-Nov-1985, 36 y.o.   MRN: [id number]  Chief Complaint  Patient presents with   Cough   Shortness of Breath   Chest Pain    Statred in June shortness of breath ,cough pain in chest .    HPI The patient is in today for SOB, chest pain, and cough. She reports that she has been smoking since the age of 11 years. She smokes one pack a day. She has had recurrent pneumonia since June and was recently treated with azithromycin and medro dose pack with minimal relief of her symptoms. An Albuterol inhaler was prescribed with minimum relief of her SOB. She is concerned about malignancy and would like a referral to a specialist to rule out malignancy. Most recent chest x-ray  on 08/06/22 shows patchy consolidative opacities of the right mid and upper lung.   Review of Systems  Constitutional:  Negative for chills and fever.  Respiratory:  Positive for cough and shortness of breath.   Cardiovascular:  Positive for chest pain. Negative for palpitations and leg swelling.  Gastrointestinal:  Negative for nausea and vomiting.  Psychiatric/Behavioral:  Negative for depression. The patient does not have insomnia.         Objective:    BP 130/70 (BP Location: Right Arm, Patient Position: Sitting)   Pulse 72   Ht 5\' 3"  (1.6 m)   Wt 211 lb (95.7 kg)   SpO2 96%   BMI 37.38 kg/m    Physical Exam HENT:     Head: Normocephalic.  Cardiovascular:     Rate and Rhythm: Normal rate and regular rhythm.  Pulmonary:     Effort: Pulmonary effort is normal.     Breath sounds: Normal breath sounds. No wheezing or rhonchi.  Neurological:     Mental Status: She is alert.     No results found for any visits on 08/18/22.      Assessment & Plan:   Problem List Items Addressed This Visit       Respiratory   Recurrent pneumonia    The patient is not a candidate for lung cancer screening because of her age Will  place a referral to pulmonary for further evaluation, given the patient's smoking history      Relevant Orders   Ambulatory referral to Pulmonology   Other Visit Diagnoses     Screening for lung cancer    -  Primary       No orders of the defined types were placed in this encounter.   Return if symptoms worsen or fail to improve.  Alvira Monday, FNP

## 2022-08-22 LAB — VALPROIC ACID LEVEL: Valproic Acid Lvl: 13.4 mg/L — ABNORMAL LOW (ref 50.0–100.0)

## 2022-08-25 MED ORDER — DIVALPROEX SODIUM ER 500 MG PO TB24: 500.0000 mg | ORAL_TABLET | Freq: Every evening | ORAL | 0 refills | Status: DC

## 2022-08-26 ENCOUNTER — Encounter: Payer: Self-pay | Admitting: Pulmonary Disease

## 2022-08-26 ENCOUNTER — Ambulatory Visit (INDEPENDENT_AMBULATORY_CARE_PROVIDER_SITE_OTHER): Payer: Medicaid Other | Admitting: Pulmonary Disease

## 2022-08-26 VITALS — BP 114/80 | HR 72

## 2022-08-26 DIAGNOSIS — R0609 Other forms of dyspnea: Secondary | ICD-10-CM | POA: Diagnosis not present

## 2022-08-26 MED ORDER — PREDNISONE 20 MG PO TABS: ORAL_TABLET | ORAL | 0 refills | Status: DC

## 2022-08-26 MED ORDER — AZITHROMYCIN 250 MG PO TABS: ORAL_TABLET | ORAL | 0 refills | Status: DC

## 2022-08-26 NOTE — Patient Instructions (Signed)
CT scan chest without contrast to evaluate abnormal chest x-ray, groundglass changes at the base of the lungs on recent CT  Breathing study  Prescription for antibiotics and steroids sent to pharmacy for you  Try to work on quitting smoking  Follow-up in 4 to 6 weeks
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
## 2022-08-28 ENCOUNTER — Telehealth: Payer: Self-pay | Admitting: Pulmonary Disease

## 2022-08-28 ENCOUNTER — Ambulatory Visit: Payer: Medicaid Other | Admitting: Family Medicine

## 2022-08-28 ENCOUNTER — Encounter: Payer: Self-pay | Admitting: Family Medicine

## 2022-08-28 VITALS — BP 122/72 | HR 81 | Ht 63.0 in | Wt 210.4 lb

## 2022-08-28 DIAGNOSIS — R042 Hemoptysis: Secondary | ICD-10-CM

## 2022-08-28 DIAGNOSIS — E669 Obesity, unspecified: Secondary | ICD-10-CM | POA: Diagnosis not present

## 2022-08-28 NOTE — Telephone Encounter (Signed)
Called and spoke with patient. She stated that she was seen by AO on 08/26/22 and he prescribed a prednisone taper and zpak. She did not start these medications until 10/05. She woke up this morning coughing up blood. She described the blood as bright red and the amount appears to be bigger than a dime. She denied any chest pain, difficulty swallowing or throat pain. She mentioned that she has been coughing very hard lately and wonders if this could cause the blood as well.   Pharmacy is [facility] in [place].   Dr. Melvyn Novas, can you please advise since AO is not available today? Thanks!

## 2022-08-28 NOTE — Patient Instructions (Signed)
I appreciate the opportunity to provide care to you today!    Follow up:  3 months  Please f/u with your CT scan on 09/01/2022 and your chest x-ray on 09/04/2022   Please continue to a heart-healthy diet and increase your physical activities. Try to exercise for 40mins at least three times a week.      It was a pleasure to see you and I look forward to continuing to work together on your health and well-being. Please do not hesitate to call the office if you need care or have questions about your care.   Have a wonderful day and week. With Gratitude, Alvira Monday MSN, FNP-BC

## 2022-08-28 NOTE — Assessment & Plan Note (Addendum)
Nonlife-threatening hemoptysis We will follow up on imaging studies Encouraged to continue taking azithromycin and prednisone 20 mg daily Encouraged to follow up with her CT scan of the chest without contrast on 09/01/2022 Encouraged to follow up on her chest x-ray, which is scheduled on 09/04/2022 for clearing of her pneumonia We will continue to monitor hemoptysis

## 2022-08-28 NOTE — Telephone Encounter (Signed)
Stop all smoking   While coughing increase prilosec to Take 30- 60 min before your first and last meals of the day   For cough mucinex dm 1200 mg twice daily   To ER if bleeding worsens over the weekend and call for f/u 1st of week if not doing a lot better

## 2022-08-28 NOTE — Assessment & Plan Note (Addendum)
-  Discussed lifestyle modifications, including healthy eating habits and increasing physical activities - Discussed intermittent fasting, weight loss pills, and injections -Informed the patient that her insurance would not cover injectables such as wegvoy -Informed the patient that taking depakote with phentermine will lower her seizure threshold and increase her risks of experiencing seizures -Patient declines oral antidiabetic treatment with phentermine  at this time
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
## 2022-08-28 NOTE — Telephone Encounter (Signed)
Called and spoke with patient. She verbalized understanding.   Nothing further needed at time of call.  

## 2022-08-31 ENCOUNTER — Telehealth (INDEPENDENT_AMBULATORY_CARE_PROVIDER_SITE_OTHER): Payer: Medicaid Other | Admitting: Psychiatry

## 2022-08-31 ENCOUNTER — Encounter (HOSPITAL_COMMUNITY): Payer: Self-pay | Admitting: Psychiatry

## 2022-08-31 DIAGNOSIS — F411 Generalized anxiety disorder: Secondary | ICD-10-CM | POA: Diagnosis not present

## 2022-08-31 DIAGNOSIS — F17200 Nicotine dependence, unspecified, uncomplicated: Secondary | ICD-10-CM

## 2022-08-31 DIAGNOSIS — Z87820 Personal history of traumatic brain injury: Secondary | ICD-10-CM | POA: Diagnosis not present

## 2022-08-31 DIAGNOSIS — F17210 Nicotine dependence, cigarettes, uncomplicated: Secondary | ICD-10-CM

## 2022-08-31 DIAGNOSIS — F332 Major depressive disorder, recurrent severe without psychotic features: Secondary | ICD-10-CM | POA: Diagnosis not present

## 2022-08-31 DIAGNOSIS — Z79899 Other long term (current) drug therapy: Secondary | ICD-10-CM

## 2022-08-31 DIAGNOSIS — F410 Panic disorder [episodic paroxysmal anxiety] without agoraphobia: Secondary | ICD-10-CM

## 2022-08-31 DIAGNOSIS — G8929 Other chronic pain: Secondary | ICD-10-CM

## 2022-08-31 MED ORDER — BUPROPION HCL ER (XL) 150 MG PO TB24: 150.0000 mg | ORAL_TABLET | ORAL | 0 refills | Status: DC

## 2022-08-31 NOTE — Patient Instructions (Signed)
We discontinued your celexa today. We also started wellbutrin xl 150mg  daily, take this in the morning. Keep up the good work with cutting back on the amount you are smoking.
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
## 2022-09-01 ENCOUNTER — Ambulatory Visit (HOSPITAL_COMMUNITY)
Admission: RE | Admit: 2022-09-01 | Discharge: 2022-09-01 | Disposition: A | Discharge status: Home / Self Care | Payer: Medicaid Other | Source: Ambulatory Visit | Attending: Pulmonary Disease | Admitting: Pulmonary Disease

## 2022-09-01 ENCOUNTER — Ambulatory Visit (HOSPITAL_COMMUNITY)
Admission: RE | Admit: 2022-09-01 | Discharge: 2022-09-01 | Disposition: A | Discharge status: Home / Self Care | Payer: Medicaid Other | Source: Ambulatory Visit | Attending: Family Medicine | Admitting: Family Medicine

## 2022-09-01 DIAGNOSIS — R0609 Other forms of dyspnea: Secondary | ICD-10-CM | POA: Diagnosis not present

## 2022-09-01 DIAGNOSIS — J13 Pneumonia due to Streptococcus pneumoniae: Secondary | ICD-10-CM | POA: Insufficient documentation

## 2022-09-02 ENCOUNTER — Institutional Professional Consult (permissible substitution): Payer: Medicaid Other | Admitting: Pulmonary Disease

## 2022-09-03 ENCOUNTER — Other Ambulatory Visit: Payer: Self-pay | Admitting: Family Medicine

## 2022-09-03 ENCOUNTER — Telehealth: Payer: Self-pay | Admitting: Pulmonary Disease

## 2022-09-03 ENCOUNTER — Other Ambulatory Visit: Payer: Self-pay | Admitting: Pulmonary Disease

## 2022-09-03 DIAGNOSIS — J189 Pneumonia, unspecified organism: Secondary | ICD-10-CM

## 2022-09-03 MED ORDER — LEVOFLOXACIN 750 MG PO TABS: 750.0000 mg | ORAL_TABLET | Freq: Every day | ORAL | 0 refills | Status: DC

## 2022-09-03 MED ORDER — PREDNISONE 20 MG PO TABS: 20.0000 mg | ORAL_TABLET | Freq: Every day | ORAL | 0 refills | Status: DC

## 2022-09-03 NOTE — Telephone Encounter (Signed)
We can try and schedule a bronchoscopy for 23rd, 25th or 26 October

## 2022-09-03 NOTE — Telephone Encounter (Signed)
Called and spoke with patient. Patient stated that her cough is coming back. Patient said her cough came back 1 day later after finishing up the antibiotics. Patient also stated that she got told that on her CT results that she has scattered ground glass in both lungs now.   Patient wants AO to look at her Ct results and figure out what they can do to get her better.   AO, please advise.

## 2022-09-03 NOTE — Telephone Encounter (Signed)
Called and spoke with pt. Pt said she is fine having the bronch with biopsies performed. Routing back to AO.

## 2022-09-03 NOTE — Telephone Encounter (Signed)
See other encounter from today.  

## 2022-09-03 NOTE — Progress Notes (Signed)
The patient declines antibiotic therapy now, noting that she has been on antibiotics since June 2023. She reports that she has quit smoking for a week now. The referral was placed to infectious disease for further evaluation of her recurrent pneumonia.

## 2022-09-03 NOTE — Telephone Encounter (Signed)
Please schedule the following: Bronchoscopy with biopsy  Provider performing procedure:Vonda Harth Diagnosis: Smoking-related interstitial lung disease Which side if for nodule / mass?  Bilateral infiltrates Procedure: Bronchoscopy with biopsy Has patient been spoken to by Provider and given informed consent?  Patient agreeable Anesthesia: General Do you need Fluro?  Yes Duration of procedure: 20 to 30 minutes Date: October 23, October 25, October 26th Alternate Date:   Time: AM/ PM [facility]: [facility] Does patient have OSA?  No DM? no Or Latex allergy? no Medication Restriction/ Anticoagulate/Antiplatelet: none Pre-op Labs Ordered:determined by Anesthesia Imaging request: none  (If, SuperDimension CT Chest, please have STAT courier sent to ENDO)  Please coordinate Pre-op COVID Testing

## 2022-09-03 NOTE — Telephone Encounter (Signed)
Called and spoke with pt letting her know recs per Dr. Jenetta Downer. Pt said that she does not want to do any more abx or steroids as she has been on abx and steroids at least 5 times and does not want to be on anymore.  Dr. Jenetta Downer, please advise what else you recommend.

## 2022-09-03 NOTE — Progress Notes (Signed)
Prescription for Levaquin and prednisone called to pharmacy 

## 2022-09-03 NOTE — Telephone Encounter (Signed)
Findings on CAT scan is suggestive of smoking-related disease  Quitting smoking is about the only thing that will help  We can consider bronchoscopy with biopsy of the lungs-this will not make you feel any better but can define the process that is ongoing in the lung  -still boils down to need to quit smoking

## 2022-09-03 NOTE — Telephone Encounter (Signed)
I will call in a different course of antibiotics and steroids   findings on the CT may be related to smoking-related Inflammation -It will be unrealistic to think things will get better without quitting smoking  .  Needs to quit smoking .  This will continue to do significant harm to the lung if not stopped  If the course of antibiotics and steroids does not resolve symptoms then we can consider bronchoscopy.

## 2022-09-07 ENCOUNTER — Telehealth (INDEPENDENT_AMBULATORY_CARE_PROVIDER_SITE_OTHER): Payer: Medicaid Other | Admitting: Psychiatry

## 2022-09-07 ENCOUNTER — Encounter (HOSPITAL_COMMUNITY): Payer: Self-pay | Admitting: Psychiatry

## 2022-09-07 ENCOUNTER — Encounter: Payer: Self-pay | Admitting: Pulmonary Disease

## 2022-09-07 DIAGNOSIS — F410 Panic disorder [episodic paroxysmal anxiety] without agoraphobia: Secondary | ICD-10-CM

## 2022-09-07 DIAGNOSIS — F332 Major depressive disorder, recurrent severe without psychotic features: Secondary | ICD-10-CM | POA: Diagnosis not present

## 2022-09-07 DIAGNOSIS — F411 Generalized anxiety disorder: Secondary | ICD-10-CM

## 2022-09-07 DIAGNOSIS — F17210 Nicotine dependence, cigarettes, uncomplicated: Secondary | ICD-10-CM

## 2022-09-07 DIAGNOSIS — G8929 Other chronic pain: Secondary | ICD-10-CM

## 2022-09-07 DIAGNOSIS — F17200 Nicotine dependence, unspecified, uncomplicated: Secondary | ICD-10-CM

## 2022-09-07 DIAGNOSIS — Z79899 Other long term (current) drug therapy: Secondary | ICD-10-CM

## 2022-09-07 NOTE — Patient Instructions (Signed)
Try adding the nicotine lozenges to your medication regimen today to see if this helps cut back on the amount you smoke each day and also some of the irritability. For now, we will leave your other medications the same. Here are some worksheets that should be helpful at identifying triggers for anger and ways to channel it away from yelling: [email or web address]-worksheets/dbt/[email or web address]
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
## 2022-09-07 NOTE — Telephone Encounter (Signed)
Bronch scheduled for 26th at 730

## 2022-09-07 NOTE — Telephone Encounter (Signed)
Patient called in today asking about Bron[facility] appt details and she would like to go ahead and get it scheduled as soon as she can. I've informed her that we are working on this as Dr. Ander Slade has to confirm his available dates for this procedure to take place.

## 2022-09-08 ENCOUNTER — Other Ambulatory Visit: Payer: Self-pay | Admitting: Orthopedic Surgery

## 2022-09-08 ENCOUNTER — Telehealth: Payer: Self-pay | Admitting: Family Medicine

## 2022-09-08 ENCOUNTER — Ambulatory Visit: Payer: Medicaid Other | Admitting: Internal Medicine

## 2022-09-08 DIAGNOSIS — M545 Low back pain, unspecified: Secondary | ICD-10-CM

## 2022-09-08 NOTE — Telephone Encounter (Signed)
Pt scheduled telephone appointment on 10/18.

## 2022-09-08 NOTE — Telephone Encounter (Signed)
NO PA REQ all codes
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
## 2022-09-08 NOTE — Telephone Encounter (Signed)
Patient called back neck its not patient throat front side of neck ear and jaw hurts keeping getting headaches. Not sure what it is on front of neck very tender.

## 2022-09-08 NOTE — Telephone Encounter (Signed)
Pt called stating she is still coughing very badly to the point it is making her throw up. States the food that is coming back up it like she has not chew it at all & almost getting choked on it as it is coming up. States it is interrupting her sleep very badly. States she has been taking the medication as prescribed and it is not helping. States she has no relief at all. Wants to know wht else can be done?    Uses [facility] on [address].

## 2022-09-09 ENCOUNTER — Encounter: Payer: Self-pay | Admitting: Family Medicine

## 2022-09-09 ENCOUNTER — Encounter: Payer: Medicaid Other | Admitting: Family Medicine

## 2022-09-09 ENCOUNTER — Telehealth: Payer: Medicaid Other | Admitting: Family Medicine

## 2022-09-09 DIAGNOSIS — R590 Localized enlarged lymph nodes: Secondary | ICD-10-CM | POA: Diagnosis not present
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
## 2022-09-10 ENCOUNTER — Other Ambulatory Visit: Payer: Self-pay

## 2022-09-10 ENCOUNTER — Encounter: Payer: Self-pay | Admitting: Internal Medicine

## 2022-09-10 ENCOUNTER — Ambulatory Visit (INDEPENDENT_AMBULATORY_CARE_PROVIDER_SITE_OTHER): Payer: Medicaid Other | Admitting: Internal Medicine

## 2022-09-10 VITALS — BP 121/80 | HR 71 | Temp 97.5°F | Ht 62.0 in | Wt 213.0 lb

## 2022-09-10 DIAGNOSIS — K279 Peptic ulcer, site unspecified, unspecified as acute or chronic, without hemorrhage or perforation: Secondary | ICD-10-CM

## 2022-09-10 DIAGNOSIS — J189 Pneumonia, unspecified organism: Secondary | ICD-10-CM | POA: Diagnosis not present

## 2022-09-10 MED ORDER — RANITIDINE HCL 150 MG PO CAPS: 150.0000 mg | ORAL_CAPSULE | Freq: Two times a day (BID) | ORAL | 2 refills | Status: DC

## 2022-09-10 MED ORDER — OMEPRAZOLE 20 MG PO CPDR: 20.0000 mg | DELAYED_RELEASE_CAPSULE | Freq: Two times a day (BID) | ORAL | 2 refills | Status: DC

## 2022-09-10 NOTE — Assessment & Plan Note (Signed)
Patient here for referral of "recurrent pneumonia" although this diagnosis is not particularly clear.  She has some chronic respiratory symptoms making typical bacterial infection unlikely and she has been given numerous courses of antibiotics without resolution.  What she describes seems more consistent with refractory GERD (cough at night, worsened with lying down, improved with H2 blocker).  She has no fevers, chills.  She will have bronchoscopy this month to further evaluate and cultures at that time can be done for AFB, fungal, bacteria, Aspergillus galactomannan.  Would also recommend cytology and pathology as able to be thorough.  For now, would not provide any empiric antibiotic coverage.  I also recommended continuing twice daily PPI with Prilosec 20mg  BID and will add H2 blocker with Zantac 150mg  BID as well.  I placed a GI referral to evaluate her for her GERD symptoms to see if endoscopic evaluation would be warranted.  Follow up in 6-8 weeks and continued to encourage cessation from smoking. Will also check an updated HIV screen.

## 2022-09-10 NOTE — Progress Notes (Signed)
Erroneous encounter-disregard

## 2022-09-10 NOTE — Patient Instructions (Signed)
Thank you for coming to see me today. It was a pleasure seeing you.  To Do: Start taking Zantac twice daily.  I sent prescription Continue Prilosec twice daily.  I sent new refill I placed referral to GI for you today as well.  If you have any questions or concerns, please do not hesitate to call the office at [phone].  Take Care,   Jule Ser
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
## 2022-09-11 LAB — HIV ANTIBODY (ROUTINE TESTING W REFLEX): HIV 1&2 Ab, 4th Generation: NONREACTIVE

## 2022-09-14 ENCOUNTER — Other Ambulatory Visit: Payer: Self-pay | Admitting: Pulmonary Disease

## 2022-09-14 ENCOUNTER — Telehealth: Payer: Self-pay | Admitting: Pulmonary Disease

## 2022-09-14 ENCOUNTER — Other Ambulatory Visit: Payer: Self-pay | Admitting: *Deleted

## 2022-09-14 ENCOUNTER — Other Ambulatory Visit: Payer: Medicaid Other

## 2022-09-14 DIAGNOSIS — Z01818 Encounter for other preprocedural examination: Secondary | ICD-10-CM

## 2022-09-14 NOTE — Telephone Encounter (Signed)
Called and spoke with pt and explained to her that the covid tests are done at our office prior to the bronchs so we can have the results back before the procedure. Pt verbalized understanding. Nothing further needed.

## 2022-09-16 ENCOUNTER — Encounter (HOSPITAL_COMMUNITY): Payer: Self-pay | Admitting: Pulmonary Disease

## 2022-09-16 ENCOUNTER — Other Ambulatory Visit: Payer: Self-pay

## 2022-09-16 LAB — NOVEL CORONAVIRUS, NAA: SARS-CoV-2, NAA: NOT DETECTED

## 2022-09-16 LAB — SPECIMEN STATUS REPORT

## 2022-09-16 NOTE — Progress Notes (Signed)
PCP - [facility] Cardiologist - denies  PPM/ICD - denies  Chest x-ray - 09/02/22 EKG - 08/17/22  CPAP - n/a  Fasting Blood Sugar - n/a  Blood Thinner Instructions: n/a Aspirin Instructions: Patient was instructed: As of today, STOP taking any Aspirin (unless otherwise instructed by your surgeon) Aleve, Naproxen, Ibuprofen, Motrin, Advil, Goody's, BC's, all herbal medications, fish oil, and all vitamins.  ERAS Protcol - n/a  COVID TEST- negative on 09/14/22  Anesthesia review: no  Patient verbally denies any shortness of breath, fever, cough and chest pain during phone call   -------------  SDW INSTRUCTIONS given:  Your procedure is scheduled on Thursday, October 26th, 2023.  Report to [facility] Main Entrance "A" at 05:30 A.M., and check in at the Admitting office.  Call this number if you have problems the morning of surgery:  [phone]   Remember:  Do not eat or drink after midnight the night before your surgery    Take these medicines the morning of surgery with A SIP OF WATER Bupropion, Buspar, Gabapentin, Omeprazole, Carafate PRN - Albuterol inhaler - bring the inhaler with you the day of surgery   The day of surgery:                     Do not wear jewelry, make up, or nail polish            Do not wear lotions, powders, perfumes, or deodorant.            Do not shave 48 hours prior to surgery.              Do not bring valuables to the hospital.            [facility] is not responsible for any belongings or valuables.  Do NOT Smoke (Tobacco/Vaping) 24 hours prior to your procedure If you use a CPAP at night, you may bring all equipment for your overnight stay.   Contacts, glasses, dentures or bridgework may not be worn into surgery.      For patients admitted to the hospital, discharge time will be determined by your treatment team.   Patients discharged the day of surgery will not be allowed to drive home, and someone needs to stay with  them for 24 hours.    Special instructions:   [facility]- Preparing For Surgery  Before surgery, you can play an important role. Because skin is not sterile, your skin needs to be as free of germs as possible. You can reduce the number of germs on your skin by washing with CHG (chlorahexidine gluconate) Soap before surgery.  CHG is an antiseptic cleaner which kills germs and bonds with the skin to continue killing germs even after washing.    Oral Hygiene is also important to reduce your risk of infection.  Remember - BRUSH YOUR TEETH THE MORNING OF SURGERY WITH YOUR REGULAR TOOTHPASTE  Please do not use if you have an allergy to CHG or antibacterial soaps. If your skin becomes reddened/irritated stop using the CHG.  Do not shave (including legs and underarms) for at least 48 hours prior to first CHG shower. It is OK to shave your face.  Please follow these instructions carefully.   Shower the NIGHT BEFORE SURGERY and the MORNING OF SURGERY with DIAL Soap.   Pat yourself dry with a CLEAN TOWEL.  Wear CLEAN PAJAMAS to bed the night before surgery  Place CLEAN SHEETS on your bed the  night of your first shower and DO NOT SLEEP WITH PETS.   Day of Surgery: Please shower morning of surgery  Wear Clean/Comfortable clothing the morning of surgery Do not apply any deodorants/lotions.   Remember to brush your teeth WITH YOUR REGULAR TOOTHPASTE.   Questions were answered. Patient verbalized understanding of instructions.

## 2022-09-16 NOTE — Anesthesia Preprocedure Evaluation (Signed)
Anesthesia Evaluation  Patient identified by MRN, date of birth, ID band Patient awake    Reviewed: Allergy & Precautions, NPO status , Patient's Chart, lab work & pertinent test results  History of Anesthesia Complications Negative for: history of anesthetic complications  Airway Mallampati: II  TM Distance: >3 FB Neck ROM: Full    Dental  (+) Dental Advisory Given, Teeth Intact   Pulmonary former smoker  CT Chest - 1. Moderate multifocal ground-glass pulmonary infiltrate scattered throughout the lungs, asymmetrically more severe within the left upper lobe, and improved within the right middle and lower lobes when compared to prior exam. Differential considerations are as listed above. 2. Small hiatal hernia.    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     GI/Hepatic Neg liver ROS, hiatal hernia, PUD,GERD  Medicated and Controlled,,  Endo/Other   Obesity   Renal/GU negative Renal ROS     Musculoskeletal negative musculoskeletal ROS (+)    Abdominal   Peds  (+) ADHD Hematology negative hematology ROS (+)   Anesthesia Other Findings   Reproductive/Obstetrics                             Anesthesia Physical Anesthesia Plan  ASA: 2  Anesthesia Plan: General   Post-op Pain Management: Minimal or no pain anticipated   Induction: Intravenous  PONV Risk Score and Plan: 3 and Treatment may vary due to age or medical condition, Ondansetron, Dexamethasone and Midazolam  Airway Management Planned: Oral ETT  Additional Equipment: None  Intra-op Plan:   Post-operative Plan: Extubation in OR  Informed Consent: I have reviewed the patients History and Physical, chart, labs and discussed the procedure including the  risks, benefits and alternatives for the proposed anesthesia with the patient or authorized representative who has indicated his/her understanding and acceptance.     Dental advisory given  Plan Discussed with: CRNA and Anesthesiologist  Anesthesia Plan Comments:         Anesthesia Quick Evaluation

## 2022-09-17 ENCOUNTER — Ambulatory Visit (HOSPITAL_COMMUNITY): Payer: Medicaid Other

## 2022-09-17 ENCOUNTER — Ambulatory Visit (HOSPITAL_COMMUNITY): Payer: Medicaid Other | Admitting: Anesthesiology

## 2022-09-17 ENCOUNTER — Telehealth (HOSPITAL_COMMUNITY): Payer: Medicaid Other | Admitting: Psychiatry

## 2022-09-17 ENCOUNTER — Encounter (HOSPITAL_COMMUNITY)
Admission: RE | Disposition: A | Discharge status: Home / Self Care | Payer: Self-pay | Source: Home / Self Care | Attending: Pulmonary Disease

## 2022-09-17 ENCOUNTER — Ambulatory Visit (HOSPITAL_BASED_OUTPATIENT_CLINIC_OR_DEPARTMENT_OTHER): Payer: Medicaid Other | Admitting: Anesthesiology

## 2022-09-17 ENCOUNTER — Encounter (HOSPITAL_COMMUNITY): Payer: Self-pay | Admitting: Pulmonary Disease

## 2022-09-17 ENCOUNTER — Ambulatory Visit (HOSPITAL_COMMUNITY)
Admission: RE | Admit: 2022-09-17 | Discharge: 2022-09-17 | Disposition: A | Discharge status: Home / Self Care | Payer: Medicaid Other | Attending: Pulmonary Disease | Admitting: Pulmonary Disease

## 2022-09-17 DIAGNOSIS — K279 Peptic ulcer, site unspecified, unspecified as acute or chronic, without hemorrhage or perforation: Secondary | ICD-10-CM | POA: Diagnosis not present

## 2022-09-17 DIAGNOSIS — G894 Chronic pain syndrome: Secondary | ICD-10-CM | POA: Insufficient documentation

## 2022-09-17 DIAGNOSIS — Z87891 Personal history of nicotine dependence: Secondary | ICD-10-CM | POA: Diagnosis not present

## 2022-09-17 DIAGNOSIS — R9389 Abnormal findings on diagnostic imaging of other specified body structures: Secondary | ICD-10-CM

## 2022-09-17 DIAGNOSIS — J849 Interstitial pulmonary disease, unspecified: Secondary | ICD-10-CM

## 2022-09-17 DIAGNOSIS — F419 Anxiety disorder, unspecified: Secondary | ICD-10-CM | POA: Insufficient documentation

## 2022-09-17 DIAGNOSIS — J984 Other disorders of lung: Secondary | ICD-10-CM | POA: Insufficient documentation

## 2022-09-17 HISTORY — DX: Pneumonia, unspecified organism: J18.9

## 2022-09-17 HISTORY — PX: BRONCHIAL BIOPSY: SHX5109

## 2022-09-17 HISTORY — PX: VIDEO BRONCHOSCOPY: SHX5072

## 2022-09-17 HISTORY — PX: BRONCHIAL WASHINGS: SHX5105

## 2022-09-17 LAB — CBC
HCT: 36.8 % (ref 36.0–46.0)
Hemoglobin: 13.1 g/dL (ref 12.0–15.0)
MCH: 32.5 pg (ref 26.0–34.0)
MCHC: 35.6 g/dL (ref 30.0–36.0)
MCV: 91.3 fL (ref 80.0–100.0)
Platelets: 197 10*3/uL (ref 150–400)
RBC: 4.03 MIL/uL (ref 3.87–5.11)
RDW: 11.8 % (ref 11.5–15.5)
WBC: 7 10*3/uL (ref 4.0–10.5)
nRBC: 0 % (ref 0.0–0.2)

## 2022-09-17 LAB — BASIC METABOLIC PANEL
Anion gap: 8 (ref 5–15)
BUN: 9 mg/dL (ref 6–20)
CO2: 25 mmol/L (ref 22–32)
Calcium: 8.7 mg/dL — ABNORMAL LOW (ref 8.9–10.3)
Chloride: 105 mmol/L (ref 98–111)
Creatinine, Ser: 0.66 mg/dL (ref 0.44–1.00)
GFR, Estimated: >60 mL/min (ref >60)
Glucose, Bld: 92 mg/dL (ref 70–99)
Potassium: 3.6 mmol/L (ref 3.5–5.1)
Sodium: 138 mmol/L (ref 135–145)

## 2022-09-17 LAB — POCT PREGNANCY, URINE: Preg Test, Ur: NEGATIVE

## 2022-09-17 SURGERY — BRONCHOSCOPY, WITH FLUOROSCOPY
Anesthesia: General

## 2022-09-17 MED ORDER — GLYCOPYRROLATE 0.2 MG/ML IJ SOLN
INTRAMUSCULAR | Status: DC | PRN
  Administered 2022-09-17: .2 mg via INTRAVENOUS

## 2022-09-17 MED ORDER — FENTANYL CITRATE (PF) 100 MCG/2ML IJ SOLN: 25.0000 ug | INTRAMUSCULAR | Status: DC | PRN

## 2022-09-17 MED ORDER — PROPOFOL 10 MG/ML IV BOLUS
INTRAVENOUS | Status: DC | PRN
  Administered 2022-09-17: 160 mg via INTRAVENOUS

## 2022-09-17 MED ORDER — LACTATED RINGERS IV SOLN: INTRAVENOUS | Status: DC

## 2022-09-17 MED ORDER — MIDAZOLAM HCL 5 MG/5ML IJ SOLN
INTRAMUSCULAR | Status: DC | PRN
  Administered 2022-09-17: 2 mg via INTRAVENOUS

## 2022-09-17 MED ORDER — ONDANSETRON HCL 4 MG/2ML IJ SOLN
INTRAMUSCULAR | Status: DC | PRN
  Administered 2022-09-17: 4 mg via INTRAVENOUS

## 2022-09-17 MED ORDER — DEXAMETHASONE SODIUM PHOSPHATE 10 MG/ML IJ SOLN
INTRAMUSCULAR | Status: DC | PRN
  Administered 2022-09-17: 10 mg via INTRAVENOUS

## 2022-09-17 MED ORDER — PHENYLEPHRINE 80 MCG/ML (10ML) SYRINGE FOR IV PUSH (FOR BLOOD PRESSURE SUPPORT)
PREFILLED_SYRINGE | INTRAVENOUS | Status: DC | PRN
  Administered 2022-09-17: 160 ug via INTRAVENOUS

## 2022-09-17 MED ORDER — FENTANYL CITRATE (PF) 250 MCG/5ML IJ SOLN
INTRAMUSCULAR | Status: DC | PRN
  Administered 2022-09-17: 100 ug via INTRAVENOUS

## 2022-09-17 MED ORDER — OXYCODONE HCL 5 MG PO TABS: 5.0000 mg | ORAL_TABLET | Freq: Once | ORAL | Status: DC | PRN

## 2022-09-17 MED ORDER — ROCURONIUM BROMIDE 10 MG/ML (PF) SYRINGE
PREFILLED_SYRINGE | INTRAVENOUS | Status: DC | PRN
  Administered 2022-09-17: 50 mg via INTRAVENOUS

## 2022-09-17 MED ORDER — CHLORHEXIDINE GLUCONATE 0.12 % MT SOLN: 15.0000 mL | Freq: Once | OROMUCOSAL | Status: AC

## 2022-09-17 MED ORDER — CHLORHEXIDINE GLUCONATE 0.12 % MT SOLN
OROMUCOSAL | Status: AC
  Administered 2022-09-17: 15 mL via OROMUCOSAL
  Filled 2022-09-17: qty 15

## 2022-09-17 MED ORDER — ONDANSETRON HCL 4 MG/2ML IJ SOLN: 4.0000 mg | Freq: Once | INTRAMUSCULAR | Status: DC | PRN

## 2022-09-17 MED ORDER — SUGAMMADEX SODIUM 200 MG/2ML IV SOLN
INTRAVENOUS | Status: DC | PRN
  Administered 2022-09-17: 400 mg via INTRAVENOUS

## 2022-09-17 MED ORDER — LIDOCAINE 2% (20 MG/ML) 5 ML SYRINGE
INTRAMUSCULAR | Status: DC | PRN
  Administered 2022-09-17: 100 mg via INTRAVENOUS

## 2022-09-17 MED ORDER — OXYCODONE HCL 5 MG/5ML PO SOLN: 5.0000 mg | Freq: Once | ORAL | Status: DC | PRN
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
## 2022-09-17 NOTE — Op Note (Signed)
[facility] Cardiopulmonary Patient Name: [name] Date: 09/17/2022 MRN: [id number] Attending MD: Laurin Coder MD, MD, [id number] Date of Birth: 02-Apr-1986 CSN: Finalized Age: 36 Admit Type: Outpatient Gender: Female Procedure:             Bronchoscopy Indications:           Interstitial lung disease Providers:             Angelyna Henderson A. Ander Slade MD, MD, Glori Bickers, RN, Darliss Cheney, Technician Referring MD:           Medicines:             General Anesthesia, See the Anesthesia note for                         documentation of the administered medications Complications:         No immediate complications Estimated Blood Loss:  Estimated blood loss was minimal. Procedure:             Pre-Anesthesia Assessment:                        - The anesthesia plan was to use general anesthesia.                        After obtaining informed consent, the bronchoscope was                         passed under direct vision. Throughout the procedure,                         the patient's blood pressure, pulse, and oxygen                         saturations were monitored continuously. the [id number]                         ([id number]) Olympus broncoscope was introduced through                         the mouth, via the endotracheal tube (the patient was                         intubated for the procedure) and advanced to the                         tracheobronchial tree. Scope In: Scope Out: Findings:      The bronchoscope was advanced until wedged at the desired location for       bronchoalveolar lavage. BAL was performed in the lung and sent for cell       count and differential, routine cytology and bacterial, AFB and fungal       analysis. 120 mL of fluid were instilled. 30 mL were returned. The       return was cloudy. There were no mucoid plugs in the return fluid.      Transbronchial biopsies of an area of infiltration were performed  in the       left upper lobe  using forceps and sent for histopathology examination.       The procedure was guided by fluoroscopy. Transbronchial biopsy technique       was selected because the sampling site was not visible endoscopically.       Six biopsy passes were performed. Six biopsy samples were obtained. Impression:            - Interstitial lung disease                        - Bronchoalveolar lavage was performed.                        - Transbronchial lung biopsies were performed. Moderate Sedation:      An independent trained observer was present and continuously monitored       the patient. Recommendation:        - Await test results. Procedure Code(s):     --- Professional ---                        [phone], Bronchoscopy, rigid or flexible, including                         fluoroscopic guidance, when performed; with                         transbronchial lung biopsy(s), single lobe                        95284, Bronchoscopy, rigid or flexible, including                         fluoroscopic guidance, when performed; with bronchial                         alveolar lavage Diagnosis Code(s):     --- Professional ---                        J84.9, Interstitial pulmonary disease, unspecified CPT copyright 2022 American Medical Association. All rights reserved. The codes documented in this report are preliminary and upon coder review may  be revised to meet current compliance requirements. Sherrilyn Rist, MD Laurin Coder MD, MD 09/17/2022 8:29:05 AM This report has been signed electronically. Number of Addenda: 0

## 2022-09-17 NOTE — Transfer of Care (Signed)
Immediate Anesthesia Transfer of Care Note  Patient: [name]  Procedure(s) Performed: VIDEO BRONCHOSCOPY WITH FLUORO BRONCHIAL BIOPSIES BRONCHIAL WASHINGS  Patient Location: PACU  Anesthesia Type:General  Level of Consciousness: awake, alert , and oriented  Airway & Oxygen Therapy: Patient Spontanous Breathing  Post-op Assessment: Report given to RN, Post -op Vital signs reviewed and stable, and Patient moving all extremities X 4  Post vital signs: Reviewed and stable  Last Vitals:  Vitals Value Taken Time  BP    Temp    Pulse    Resp    SpO2      Last Pain:  Vitals:   09/17/22 0559  TempSrc:   PainSc: 0-No pain         Complications: No notable events documented.

## 2022-09-17 NOTE — Anesthesia Procedure Notes (Addendum)
Procedure Name: Intubation Date/Time: 09/17/2022 7:51 AM  Performed by: Audry Pili, MDPre-anesthesia Checklist: Patient identified, Emergency Drugs available, Suction available and Patient being monitored Patient Re-evaluated:Patient Re-evaluated prior to induction Oxygen Delivery Method: Circle system utilized Preoxygenation: Pre-oxygenation with 100% oxygen Induction Type: IV induction Ventilation: Mask ventilation without difficulty Laryngoscope Size: Miller and 2 Grade View: Grade I Tube type: Oral Tube size: 8.5 mm Number of attempts: 3 Airway Equipment and Method: Stylet and Oral airway Placement Confirmation: ETT inserted through vocal cords under direct vision, positive ETCO2 and breath sounds checked- equal and bilateral Secured at: 23 cm Tube secured with: Tape Dental Injury: Teeth and Oropharynx as per pre-operative assessment  Comments: 2 attempts with Mac 3, grade 1 view by CRNA resulting in esophageal intubations. 3rd attempt by Dr. Fransisco Beau, also grade 1 view with Mil 2, intubated successfully.

## 2022-09-17 NOTE — Op Note (Signed)
Bronchoscopy Procedure Note  [name]  [id number]  February 06, 1986  Date:09/17/22  Time:8:19 AM   Provider Performing:Kaliope Quinonez A Yalena Colon   Procedure(s):  Flexible Bronchoscopy [phone]) and Transbronchial lung biopsy, single lobe (89169)  Indication(s) Abnormal CT chest, pneumonitis  Consent Risks of the procedure as well as the alternatives and risks of each were explained to the patient and/or caregiver.  Consent for the procedure was obtained and is signed in the bedside chart  Anesthesia General anesthesia   Time Out Verified patient identification, verified procedure, site/side was marked, verified correct patient position, special equipment/implants available, medications/allergies/relevant history reviewed, required imaging and test results available.   Sterile Technique Usual hand hygiene, masks, gowns, and gloves were used   Procedure Description Bronchoscope advanced through endotracheal tube and into airway.  Airways were examined down to subsegmental level with findings noted below.   Following diagnostic evaluation, BAL(s) performed in 160 with normal saline and return of 30 fluid  Findings: No endobronchial lesion Right upper lobe, right middle lobe right lower lobe bronchi were all visualized noted be free of lesions Left upper lobe lingula and lower lobe bronchi visualized and noted to be free of lesions  Bronchoscope advanced to the left upper lobe BAL was performed Transbronchial biopsies performed  Tolerated procedure well   Complications/Tolerance None; patient tolerated the procedure well. Chest X-ray is needed post procedure.   EBL Minimal   Specimen(s) Bronchoalveolar lavage, left upper lobe Transbronchial biopsies left upper lobe

## 2022-09-17 NOTE — Anesthesia Postprocedure Evaluation (Signed)
Anesthesia Post Note  Patient: [name]  Procedure(s) Performed: VIDEO BRONCHOSCOPY WITH FLUORO BRONCHIAL BIOPSIES BRONCHIAL WASHINGS     Patient location during evaluation: PACU Anesthesia Type: General Level of consciousness: awake and alert Pain management: pain level controlled Vital Signs Assessment: post-procedure vital signs reviewed and stable Respiratory status: spontaneous breathing, nonlabored ventilation and respiratory function stable Cardiovascular status: stable and blood pressure returned to baseline Anesthetic complications: no   No notable events documented.  Last Vitals:  Vitals:   09/17/22 0900 09/17/22 0915  BP: 132/77 120/76  Pulse: 71 71  Resp: 15 14  Temp:  36.5 C  SpO2: 97% 96%    Last Pain:  Vitals:   09/17/22 0900  TempSrc:   PainSc: 0-No pain                 Audry Pili

## 2022-09-18 LAB — SURGICAL PATHOLOGY

## 2022-09-20 ENCOUNTER — Encounter (HOSPITAL_COMMUNITY): Payer: Self-pay | Admitting: Pulmonary Disease

## 2022-09-20 LAB — CULTURE, BAL-QUANTITATIVE W GRAM STAIN
Culture: 1000 — AB
Gram Stain: NONE SEEN

## 2022-09-20 LAB — ACID FAST SMEAR (AFB, MYCOBACTERIA): Acid Fast Smear: NEGATIVE

## 2022-09-21 ENCOUNTER — Other Ambulatory Visit: Payer: Self-pay | Admitting: Pulmonary Disease

## 2022-09-21 ENCOUNTER — Telehealth: Payer: Self-pay

## 2022-09-21 LAB — CYTOLOGY - NON PAP

## 2022-09-21 MED ORDER — AZITHROMYCIN 250 MG PO TABS: ORAL_TABLET | ORAL | 0 refills | Status: DC

## 2022-09-21 NOTE — Progress Notes (Signed)
Azithromycin called in with history of penicillin allergy

## 2022-09-21 NOTE — Telephone Encounter (Signed)
Pt is requesting bronch results from 09/17/22. Dr. Ander Slade please advise.

## 2022-09-21 NOTE — Progress Notes (Signed)
Culture with haemophilus parahemolyticus--prescription for Augmentin will be sent into pharmacy  Findings on the biopsy is compatible with smoking-related lung disease -You need to stay off cigarettes. -Significant damage to the lungs from smoking is not reversible if you go back to smoking

## 2022-09-21 NOTE — Progress Notes (Signed)
Azithromycin called in with a history of penicillin allergy

## 2022-09-22 ENCOUNTER — Encounter (HOSPITAL_COMMUNITY): Payer: Self-pay | Admitting: Psychiatry

## 2022-09-22 ENCOUNTER — Telehealth: Payer: Self-pay | Admitting: Pulmonary Disease

## 2022-09-22 ENCOUNTER — Telehealth (INDEPENDENT_AMBULATORY_CARE_PROVIDER_SITE_OTHER): Payer: Medicaid Other | Admitting: Psychiatry

## 2022-09-22 DIAGNOSIS — F411 Generalized anxiety disorder: Secondary | ICD-10-CM

## 2022-09-22 DIAGNOSIS — F332 Major depressive disorder, recurrent severe without psychotic features: Secondary | ICD-10-CM | POA: Diagnosis not present

## 2022-09-22 DIAGNOSIS — G8929 Other chronic pain: Secondary | ICD-10-CM

## 2022-09-22 DIAGNOSIS — F410 Panic disorder [episodic paroxysmal anxiety] without agoraphobia: Secondary | ICD-10-CM

## 2022-09-22 DIAGNOSIS — Z79899 Other long term (current) drug therapy: Secondary | ICD-10-CM

## 2022-09-22 DIAGNOSIS — F5104 Psychophysiologic insomnia: Secondary | ICD-10-CM

## 2022-09-22 DIAGNOSIS — Z87820 Personal history of traumatic brain injury: Secondary | ICD-10-CM | POA: Diagnosis not present

## 2022-09-22 DIAGNOSIS — F17200 Nicotine dependence, unspecified, uncomplicated: Secondary | ICD-10-CM

## 2022-09-22 LAB — ANAEROBIC CULTURE W GRAM STAIN

## 2022-09-22 MED ORDER — DIVALPROEX SODIUM ER 250 MG PO TB24: 750.0000 mg | ORAL_TABLET | Freq: Every evening | ORAL | 1 refills | Status: DC

## 2022-09-22 MED ORDER — BUSPIRONE HCL 10 MG PO TABS: 10.0000 mg | ORAL_TABLET | Freq: Three times a day (TID) | ORAL | 1 refills | Status: DC

## 2022-09-22 NOTE — Telephone Encounter (Signed)
Called patient with bronch results  Updated about antibiotics being called to pharmacy for haemophilus  Updated about path results showing inflammation that may be secondary to smoking-related inflammation

## 2022-09-22 NOTE — Patient Instructions (Signed)
We discontinued your wellbutrin today due to worsening irritability. We also increased your depakote to 750mg  (three 250mg  tablets) nightly. Keep up the good work with not smoking. For the hair trigger responses, try looking through these worksheets in preparation for your therapy appointment next week. [email or web address]
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
## 2022-09-28 LAB — CULTURE, FUNGUS WITHOUT SMEAR

## 2022-09-30 ENCOUNTER — Ambulatory Visit (HOSPITAL_COMMUNITY): Payer: Medicaid Other | Admitting: Clinical

## 2022-09-30 ENCOUNTER — Ambulatory Visit (HOSPITAL_COMMUNITY): Payer: Medicaid Other | Admitting: Psychiatry

## 2022-09-30 ENCOUNTER — Encounter (HOSPITAL_COMMUNITY): Payer: Self-pay

## 2022-10-01 ENCOUNTER — Ambulatory Visit: Payer: Self-pay | Admitting: Internal Medicine

## 2022-10-01 ENCOUNTER — Telehealth (INDEPENDENT_AMBULATORY_CARE_PROVIDER_SITE_OTHER): Payer: Medicaid Other | Admitting: Psychiatry

## 2022-10-01 ENCOUNTER — Encounter (HOSPITAL_COMMUNITY): Payer: Self-pay | Admitting: Psychiatry

## 2022-10-01 DIAGNOSIS — F411 Generalized anxiety disorder: Secondary | ICD-10-CM

## 2022-10-01 DIAGNOSIS — G8929 Other chronic pain: Secondary | ICD-10-CM | POA: Diagnosis not present

## 2022-10-01 DIAGNOSIS — F5104 Psychophysiologic insomnia: Secondary | ICD-10-CM

## 2022-10-01 DIAGNOSIS — F332 Major depressive disorder, recurrent severe without psychotic features: Secondary | ICD-10-CM | POA: Diagnosis not present

## 2022-10-01 DIAGNOSIS — F17210 Nicotine dependence, cigarettes, uncomplicated: Secondary | ICD-10-CM

## 2022-10-01 DIAGNOSIS — Z87820 Personal history of traumatic brain injury: Secondary | ICD-10-CM

## 2022-10-01 DIAGNOSIS — F17200 Nicotine dependence, unspecified, uncomplicated: Secondary | ICD-10-CM

## 2022-10-01 DIAGNOSIS — G4700 Insomnia, unspecified: Secondary | ICD-10-CM | POA: Insufficient documentation

## 2022-10-01 DIAGNOSIS — F410 Panic disorder [episodic paroxysmal anxiety] without agoraphobia: Secondary | ICD-10-CM

## 2022-10-01 DIAGNOSIS — Z79899 Other long term (current) drug therapy: Secondary | ICD-10-CM

## 2022-10-01 MED ORDER — DULOXETINE HCL 20 MG PO CPEP: 20.0000 mg | ORAL_CAPSULE | Freq: Every day | ORAL | 1 refills | Status: DC

## 2022-10-01 NOTE — Patient Instructions (Addendum)
Decrease your buspar to 10mg  twice daily for the next three days, then decrease it to once daily for 3 days then stop. Start duloxetine (cymbalta) 20mg  once daily. We will likely increase this at your next appointment. Do try and go by to get your valproic acid (depakote) level drawn at your PCP soon, this will allow to make other changes to that medication if needed. Continue to lean in to being around the supportive people in your life and practice the DBT skills from the worksheets I've sent you. Do you best to reduce your smoking, it is ok to use an additional patch to help with this. Our clinic number is: [phone]) [phone].
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
## 2022-10-08 ENCOUNTER — Encounter (HOSPITAL_COMMUNITY): Payer: Self-pay | Admitting: Psychiatry

## 2022-10-08 ENCOUNTER — Telehealth (INDEPENDENT_AMBULATORY_CARE_PROVIDER_SITE_OTHER): Payer: Medicaid Other | Admitting: Psychiatry

## 2022-10-08 DIAGNOSIS — F410 Panic disorder [episodic paroxysmal anxiety] without agoraphobia: Secondary | ICD-10-CM

## 2022-10-08 DIAGNOSIS — Z87820 Personal history of traumatic brain injury: Secondary | ICD-10-CM

## 2022-10-08 DIAGNOSIS — F411 Generalized anxiety disorder: Secondary | ICD-10-CM

## 2022-10-08 DIAGNOSIS — F17200 Nicotine dependence, unspecified, uncomplicated: Secondary | ICD-10-CM

## 2022-10-08 DIAGNOSIS — G8929 Other chronic pain: Secondary | ICD-10-CM

## 2022-10-08 DIAGNOSIS — F332 Major depressive disorder, recurrent severe without psychotic features: Secondary | ICD-10-CM | POA: Diagnosis not present

## 2022-10-08 DIAGNOSIS — F5104 Psychophysiologic insomnia: Secondary | ICD-10-CM | POA: Diagnosis not present

## 2022-10-08 DIAGNOSIS — F17210 Nicotine dependence, cigarettes, uncomplicated: Secondary | ICD-10-CM

## 2022-10-08 DIAGNOSIS — Z79899 Other long term (current) drug therapy: Secondary | ICD-10-CM

## 2022-10-08 MED ORDER — TRAZODONE HCL 100 MG PO TABS: 100.0000 mg | ORAL_TABLET | Freq: Every day | ORAL | 1 refills | Status: DC

## 2022-10-08 NOTE — Patient Instructions (Signed)
We didn't make any medication changes today and will give the current doses of duloxetine and depakote time to settle in. Please go by the clinic to get your depakote level drawn before our next appointment. Keep up the good work with practicing your breathing exercises and try to cut back on the amount you are smoking to help your lungs and reduce anxiety.
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
## 2022-10-22 ENCOUNTER — Ambulatory Visit: Payer: Medicaid Other | Admitting: Internal Medicine
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
## 2022-10-23 ENCOUNTER — Telehealth (INDEPENDENT_AMBULATORY_CARE_PROVIDER_SITE_OTHER): Payer: Self-pay | Admitting: Psychiatry

## 2022-10-23 ENCOUNTER — Telehealth (HOSPITAL_COMMUNITY): Payer: Self-pay | Admitting: *Deleted

## 2022-10-23 ENCOUNTER — Encounter (HOSPITAL_COMMUNITY): Payer: Self-pay

## 2022-10-23 DIAGNOSIS — Z91199 Patient's noncompliance with other medical treatment and regimen due to unspecified reason: Secondary | ICD-10-CM

## 2022-10-23 NOTE — Telephone Encounter (Signed)
Due to tech issues, staff called patient to see if she could log into her visit by 11:30. Staff was not able to reach patient. Message stated that call can not be completed as dial. Tried calling again and same message by automatic system.

## 2022-10-24 NOTE — Progress Notes (Signed)
Could not connect due to technical difficulties. No charge

## 2022-10-27 ENCOUNTER — Ambulatory Visit: Payer: Medicaid Other | Admitting: Pulmonary Disease

## 2022-11-03 LAB — ACID FAST CULTURE WITH REFLEXED SENSITIVITIES (MYCOBACTERIA): Acid Fast Culture: NEGATIVE

## 2022-11-06 ENCOUNTER — Telehealth (HOSPITAL_COMMUNITY): Payer: Medicaid Other | Admitting: Psychiatry

## 2022-11-10 NOTE — Telephone Encounter (Signed)
Pt scheduled 11/18/22

## 2022-11-18 ENCOUNTER — Telehealth (HOSPITAL_COMMUNITY): Payer: Self-pay

## 2022-11-18 ENCOUNTER — Telehealth (INDEPENDENT_AMBULATORY_CARE_PROVIDER_SITE_OTHER): Payer: Self-pay | Admitting: Psychiatry

## 2022-11-18 DIAGNOSIS — F411 Generalized anxiety disorder: Secondary | ICD-10-CM

## 2022-11-18 DIAGNOSIS — F332 Major depressive disorder, recurrent severe without psychotic features: Secondary | ICD-10-CM

## 2022-11-18 DIAGNOSIS — G8929 Other chronic pain: Secondary | ICD-10-CM

## 2022-11-18 DIAGNOSIS — F5104 Psychophysiologic insomnia: Secondary | ICD-10-CM

## 2022-11-18 DIAGNOSIS — F410 Panic disorder [episodic paroxysmal anxiety] without agoraphobia: Secondary | ICD-10-CM

## 2022-11-18 DIAGNOSIS — Z87820 Personal history of traumatic brain injury: Secondary | ICD-10-CM

## 2022-11-18 DIAGNOSIS — Z79899 Other long term (current) drug therapy: Secondary | ICD-10-CM

## 2022-11-18 DIAGNOSIS — Z91199 Patient's noncompliance with other medical treatment and regimen due to unspecified reason: Secondary | ICD-10-CM

## 2022-11-18 MED ORDER — DIVALPROEX SODIUM ER 250 MG PO TB24: 750.0000 mg | ORAL_TABLET | Freq: Every evening | ORAL | 0 refills | Status: DC

## 2022-11-18 MED ORDER — TRAZODONE HCL 100 MG PO TABS: 100.0000 mg | ORAL_TABLET | Freq: Every day | ORAL | 0 refills | Status: DC

## 2022-11-18 MED ORDER — DULOXETINE HCL 20 MG PO CPEP: 20.0000 mg | ORAL_CAPSULE | Freq: Every day | ORAL | 0 refills | Status: DC

## 2022-11-18 NOTE — Telephone Encounter (Signed)
Pt no showed today's appt12/27/23, makes 3rd no show and provider dismissed pt. Certified letter sent via mail on 11/18/22

## 2022-11-18 NOTE — Progress Notes (Signed)
Patient no show. This is the third no show in a row. Due to clinic policy, when 3 no shows occur this results in discharge from the practice. 30 day supply of medication called in for patient and discharge letter sent.

## 2022-11-25 ENCOUNTER — Encounter: Payer: Self-pay | Admitting: Internal Medicine

## 2022-11-27 ENCOUNTER — Ambulatory Visit: Payer: Medicaid Other | Admitting: Family Medicine

## 2022-11-27 ENCOUNTER — Other Ambulatory Visit: Payer: Self-pay | Admitting: Family Medicine

## 2022-11-27 DIAGNOSIS — K279 Peptic ulcer, site unspecified, unspecified as acute or chronic, without hemorrhage or perforation: Secondary | ICD-10-CM

## 2022-11-30 ENCOUNTER — Other Ambulatory Visit: Payer: Self-pay

## 2022-11-30 ENCOUNTER — Ambulatory Visit: Payer: Medicaid Other | Admitting: Internal Medicine

## 2022-12-04 ENCOUNTER — Ambulatory Visit: Payer: Medicaid Other | Admitting: Family Medicine

## 2022-12-11 ENCOUNTER — Encounter: Payer: Self-pay | Admitting: Family Medicine

## 2022-12-11 ENCOUNTER — Ambulatory Visit: Payer: Medicaid Other | Admitting: Family Medicine

## 2022-12-11 VITALS — BP 130/84 | HR 76 | Ht 63.0 in | Wt 203.0 lb

## 2022-12-11 DIAGNOSIS — E559 Vitamin D deficiency, unspecified: Secondary | ICD-10-CM

## 2022-12-11 DIAGNOSIS — F17200 Nicotine dependence, unspecified, uncomplicated: Secondary | ICD-10-CM | POA: Diagnosis not present

## 2022-12-11 DIAGNOSIS — F331 Major depressive disorder, recurrent, moderate: Secondary | ICD-10-CM

## 2022-12-11 DIAGNOSIS — M545 Low back pain, unspecified: Secondary | ICD-10-CM

## 2022-12-11 DIAGNOSIS — R7301 Impaired fasting glucose: Secondary | ICD-10-CM | POA: Diagnosis not present

## 2022-12-11 DIAGNOSIS — M79672 Pain in left foot: Secondary | ICD-10-CM | POA: Diagnosis not present

## 2022-12-11 DIAGNOSIS — E038 Other specified hypothyroidism: Secondary | ICD-10-CM

## 2022-12-11 DIAGNOSIS — E7849 Other hyperlipidemia: Secondary | ICD-10-CM

## 2022-12-11 DIAGNOSIS — F332 Major depressive disorder, recurrent severe without psychotic features: Secondary | ICD-10-CM

## 2022-12-11 MED ORDER — GABAPENTIN 100 MG PO CAPS: ORAL_CAPSULE | ORAL | 2 refills | Status: DC

## 2022-12-11 MED ORDER — NICOTINE 21 MG/24HR TD PT24: 21.0000 mg | MEDICATED_PATCH | Freq: Every day | TRANSDERMAL | 1 refills | Status: DC
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
## 2022-12-11 NOTE — Assessment & Plan Note (Addendum)
Chronic condition with recent flareup on 12/04/2022 History of chronic calcaneus degenerative changes  She reports numbness and tingling in her toes that radiates to her left knee Pain is rated 4 out of 10 with movement and activities Pain is improved with rest She complains of an achy feeling in her left knee No recent trauma or injury reported She was follow-up therapeutics but has not recently She reports not taking gabapentin prescribed by Dr. Aline Brochure Will reinstate gabapentin 100 mg to take 3 times daily Encouraged to avoid activities that aggravates left leg pain Encouraged to follow-up with orthopedics if symptoms worsen

## 2022-12-11 NOTE — Assessment & Plan Note (Signed)
Reports being recently dismissed as the patient by Dr. Debby Bud due to missed appointments She is currently following up with Dr.Izzy in [facility] She takes Depakote 750 mg nightly and Cymbalta 20 mg daily She denies suicidal thoughts and ideation

## 2022-12-11 NOTE — Assessment & Plan Note (Signed)
She smokes 10 cigarettes daily Smoking cessation encourage We will refill nicotine patch

## 2022-12-11 NOTE — Patient Instructions (Addendum)
I appreciate the opportunity to provide care to you today!    Follow up:  3 months  Labs: please stop by the lab today to get your blood drawn (CBC, CMP, TSH, Lipid profile, HgA1c, Vit D)   Please pick up your medications at the pharmacy    Please continue to a heart-healthy diet and increase your physical activities. Try to exercise for 30mins at least five times a week.      It was a pleasure to see you and I look forward to continuing to work together on your health and well-being. Please do not hesitate to call the office if you need care or have questions about your care.   Have a wonderful day and week. With Gratitude, Ahjanae Cassel MSN, FNP-BC  

## 2022-12-29 ENCOUNTER — Encounter: Payer: Self-pay | Admitting: Internal Medicine

## 2022-12-29 ENCOUNTER — Ambulatory Visit (INDEPENDENT_AMBULATORY_CARE_PROVIDER_SITE_OTHER): Payer: Medicaid Other | Admitting: Internal Medicine

## 2022-12-29 VITALS — BP 128/78 | HR 77 | Ht 62.0 in | Wt 206.0 lb

## 2022-12-29 DIAGNOSIS — R112 Nausea with vomiting, unspecified: Secondary | ICD-10-CM | POA: Diagnosis not present

## 2022-12-29 DIAGNOSIS — R1030 Lower abdominal pain, unspecified: Secondary | ICD-10-CM

## 2022-12-29 DIAGNOSIS — K219 Gastro-esophageal reflux disease without esophagitis: Secondary | ICD-10-CM | POA: Diagnosis not present

## 2022-12-29 DIAGNOSIS — R198 Other specified symptoms and signs involving the digestive system and abdomen: Secondary | ICD-10-CM

## 2022-12-29 DIAGNOSIS — K625 Hemorrhage of anus and rectum: Secondary | ICD-10-CM | POA: Diagnosis not present

## 2022-12-29 MED ORDER — NA SULFATE-K SULFATE-MG SULF 17.5-3.13-1.6 GM/177ML PO SOLN: 1.0000 | Freq: Once | ORAL | 0 refills | Status: AC

## 2022-12-29 NOTE — Patient Instructions (Addendum)
You have been scheduled for an endoscopy and colonoscopy. Please follow the written instructions given to you at your visit today. Please pick up your prep supplies at the pharmacy within the next 1-3 days. If you use inhalers (even only as needed), please bring them with you on the day of your procedure.  Your provider has requested that you go to the basement level for lab work before leaving today. Press "B" on the elevator. The lab is located at the first door on the left as you exit the elevator.   Increase Omeprazole 20 mg tom 40 mg twice daily.  Due to recent changes in healthcare laws, you may see the results of your imaging and laboratory studies on MyChart before your provider has had a chance to review them.  We understand that in some cases there may be results that are confusing or concerning to you. Not all laboratory results come back in the same time frame and the provider may be waiting for multiple results in order to interpret others.  Please give [place] 48 hours in order for your provider to thoroughly review all the results before contacting the office for clarification of your results.    It was a pleasure to see you today!  Thank you for trusting me with your gastrointestinal care!
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
## 2023-01-21 ENCOUNTER — Other Ambulatory Visit: Payer: Self-pay | Admitting: Family Medicine

## 2023-01-21 ENCOUNTER — Encounter: Payer: Self-pay | Admitting: Radiology

## 2023-01-21 DIAGNOSIS — K279 Peptic ulcer, site unspecified, unspecified as acute or chronic, without hemorrhage or perforation: Secondary | ICD-10-CM

## 2023-02-10 ENCOUNTER — Telehealth: Payer: Self-pay | Admitting: Internal Medicine

## 2023-02-10 NOTE — Telephone Encounter (Signed)
Inbound call from patient she is schedule for a procedure on 3/27 and want to know if she can continue taking her stomach medication.Please advise

## 2023-02-10 NOTE — Telephone Encounter (Signed)
Called patient at [phone]. Patient wanted to know what medications she can take day of her procedure. Advised she may take any medications she normally takes except iron, if she will take them before 12 pm day of the procedure. No further questions.

## 2023-02-17 ENCOUNTER — Ambulatory Visit (AMBULATORY_SURGERY_CENTER): Payer: Medicaid Other | Admitting: Internal Medicine

## 2023-02-17 ENCOUNTER — Encounter: Payer: Self-pay | Admitting: Internal Medicine

## 2023-02-17 VITALS — BP 125/66 | HR 63 | Temp 98.0°F | Resp 13 | Ht 62.0 in | Wt 206.0 lb

## 2023-02-17 DIAGNOSIS — K2970 Gastritis, unspecified, without bleeding: Secondary | ICD-10-CM

## 2023-02-17 DIAGNOSIS — R112 Nausea with vomiting, unspecified: Secondary | ICD-10-CM

## 2023-02-17 DIAGNOSIS — K625 Hemorrhage of anus and rectum: Secondary | ICD-10-CM

## 2023-02-17 DIAGNOSIS — K2289 Other specified disease of esophagus: Secondary | ICD-10-CM

## 2023-02-17 DIAGNOSIS — K2980 Duodenitis without bleeding: Secondary | ICD-10-CM | POA: Diagnosis not present

## 2023-02-17 DIAGNOSIS — K219 Gastro-esophageal reflux disease without esophagitis: Secondary | ICD-10-CM

## 2023-02-17 DIAGNOSIS — D124 Benign neoplasm of descending colon: Secondary | ICD-10-CM

## 2023-02-17 DIAGNOSIS — K635 Polyp of colon: Secondary | ICD-10-CM

## 2023-02-17 DIAGNOSIS — R1030 Lower abdominal pain, unspecified: Secondary | ICD-10-CM

## 2023-02-17 DIAGNOSIS — D123 Benign neoplasm of transverse colon: Secondary | ICD-10-CM | POA: Diagnosis not present

## 2023-02-17 DIAGNOSIS — D125 Benign neoplasm of sigmoid colon: Secondary | ICD-10-CM

## 2023-02-17 MED ORDER — OMEPRAZOLE 40 MG PO CPDR: 40.0000 mg | DELAYED_RELEASE_CAPSULE | Freq: Two times a day (BID) | ORAL | 3 refills | Status: DC

## 2023-02-17 MED ORDER — SODIUM CHLORIDE 0.9 % IV SOLN: 500.0000 mL | Freq: Once | INTRAVENOUS | Status: DC

## 2023-02-17 NOTE — Progress Notes (Signed)
Called to room to assist during endoscopic procedure.  Patient ID and intended procedure confirmed with present staff. Received instructions for my participation in the procedure from the performing physician.  
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
## 2023-02-17 NOTE — Patient Instructions (Signed)
Use Prilosec 40 MG PO BID for 8 weeks Return to GI clinic in 6 weeks  YOU HAD AN ENDOSCOPIC PROCEDURE TODAY AT [facility]:   Refer to the procedure report that was given to you for any specific questions about what was found during the examination.  If the procedure report does not answer your questions, please call your gastroenterologist to clarify.  If you requested that your care partner not be given the details of your procedure findings, then the procedure report has been included in a sealed envelope for you to review at your convenience later.  YOU SHOULD EXPECT: Some feelings of bloating in the abdomen. Passage of more gas than usual.  Walking can help get rid of the air that was put into your GI tract during the procedure and reduce the bloating. If you had a lower endoscopy (such as a colonoscopy or flexible sigmoidoscopy) you may notice spotting of blood in your stool or on the toilet paper. If you underwent a bowel prep for your procedure, you may not have a normal bowel movement for a few days.  Please Note:  You might notice some irritation and congestion in your nose or some drainage.  This is from the oxygen used during your procedure.  There is no need for concern and it should clear up in a day or so.  SYMPTOMS TO REPORT IMMEDIATELY:  Following lower endoscopy (colonoscopy or flexible sigmoidoscopy):  Excessive amounts of blood in the stool  Significant tenderness or worsening of abdominal pains  Swelling of the abdomen that is new, acute  Fever of 100F or higher  Following upper endoscopy (EGD)  Vomiting of blood or coffee ground material  New chest pain or pain under the shoulder blades  Painful or persistently difficult swallowing  New shortness of breath  Fever of 100F or higher  Black, tarry-looking stools  For urgent or emergent issues, a gastroenterologist can be reached at any hour by calling [phone]. Do not use MyChart messaging for  urgent concerns.    DIET:  We do recommend a small meal at first, but then you may proceed to your regular diet.  Drink plenty of fluids but you should avoid alcoholic beverages for 24 hours.  ACTIVITY:  You should plan to take it easy for the rest of today and you should NOT DRIVE or use heavy machinery until tomorrow (because of the sedation medicines used during the test).    FOLLOW UP: Our staff will call the number listed on your records the next business day following your procedure.  We will call around 7:15- 8:00 am to check on you and address any questions or concerns that you may have regarding the information given to you following your procedure. If we do not reach you, we will leave a message.     If any biopsies were taken you will be contacted by phone or by letter within the next 1-3 weeks.  Please call us at [phone] if you have not heard about the biopsies in 3 weeks.    SIGNATURES/CONFIDENTIALITY: You and/or your care partner have signed paperwork which will be entered into your electronic medical record.  These signatures attest to the fact that that the information above on your After Visit Summary has been reviewed and is understood.  Full responsibility of the confidentiality of this discharge information lies with you and/or your care-partner.

## 2023-02-17 NOTE — Progress Notes (Signed)
Report to PACU, RN, vss, BBS= Clear.  

## 2023-02-17 NOTE — Op Note (Signed)
[facility] Patient Name: [name] Procedure Date: 02/17/2023 2:41 PM MRN: [id number] Endoscopist: Georgian Co , , [id number] Age: 37 Referring MD:  Date of Birth: 02-02-1986 Gender: Female Account #: [id number] Procedure:                Upper GI endoscopy Indications:              Heartburn, Nausea with vomiting Medicines:                Monitored Anesthesia Care Procedure:                Pre-Anesthesia Assessment:                           - Prior to the procedure, a History and Physical                            was performed, and patient medications and                            allergies were reviewed. The patient's tolerance of                            previous anesthesia was also reviewed. The risks                            and benefits of the procedure and the sedation                            options and risks were discussed with the patient.                            All questions were answered, and informed consent                            was obtained. Prior Anticoagulants: The patient has                            taken no anticoagulant or antiplatelet agents. ASA                            Grade Assessment: II - A patient with mild systemic                            disease. After reviewing the risks and benefits,                            the patient was deemed in satisfactory condition to                            undergo the procedure.                           After obtaining informed consent, the endoscope was  passed under direct vision. Throughout the                            procedure, the patient's blood pressure, pulse, and                            oxygen saturations were monitored continuously. The                            Olympus scope [id number] was introduced through the                            mouth, and advanced to the second part of duodenum.                            The upper GI  endoscopy was accomplished without                            difficulty. The patient tolerated the procedure                            well. Scope In: Scope Out: Findings:                 LA Grade B (one or more mucosal breaks greater than                            5 mm, not extending between the tops of two mucosal                            folds) esophagitis with no bleeding was found in                            the distal esophagus.                           A 2 cm hiatal hernia was present.                           Localized mild inflammation characterized by                            congestion (edema) and erythema was found in the                            gastric antrum. Biopsies were taken with a cold                            forceps for histology.                           Localized mild inflammation characterized by                            congestion (edema) and erythema was found  in the                            duodenal bulb. Biopsies were taken with a cold                            forceps for histology.                           Biopsies were taken with a cold forceps in the                            proximal esophagus and in the distal esophagus for                            histology. Complications:            No immediate complications. Estimated Blood Loss:     Estimated blood loss was minimal. Impression:               - LA Grade B esophagitis with no bleeding.                           - 2 cm hiatal hernia.                           - Gastritis. Biopsied.                           - Duodenitis. Biopsied.                           - Biopsies were taken with a cold forceps for                            histology in the proximal esophagus and in the                            distal esophagus. Recommendation:           - Use Prilosec (omeprazole) 40 mg PO BID for 8                            weeks.                           - Await pathology  results.                           - Return to GI clinic in 6 weeks.                           - Perform a colonoscopy today. Dr Georgian Co "Lyndee Leo" Lorenso Courier,  02/17/2023 3:18:11 PM

## 2023-02-17 NOTE — Op Note (Signed)
[facility] Patient Name: [name] Procedure Date: 02/17/2023 2:38 PM MRN: [id number] Endoscopist: Adline Mango [place] , , [id number] Age: 37 Referring MD:  Date of Birth: 03-Mar-1986 Gender: Female Account #: [id number] Procedure:                Colonoscopy Indications:              Rectal bleeding Medicines:                Monitored Anesthesia Care Procedure:                Pre-Anesthesia Assessment:                           - Prior to the procedure, a History and Physical                            was performed, and patient medications and                            allergies were reviewed. The patient's tolerance of                            previous anesthesia was also reviewed. The risks                            and benefits of the procedure and the sedation                            options and risks were discussed with the patient.                            All questions were answered, and informed consent                            was obtained. Prior Anticoagulants: The patient has                            taken no anticoagulant or antiplatelet agents. ASA                            Grade Assessment: II - A patient with mild systemic                            disease. After reviewing the risks and benefits,                            the patient was deemed in satisfactory condition to                            undergo the procedure.                           After obtaining informed consent, the colonoscope  was passed under direct vision. Throughout the                            procedure, the patient's blood pressure, pulse, and                            oxygen saturations were monitored continuously. The                            CF [id number] was introduced through the anus                            and advanced to the the terminal ileum. The                            colonoscopy was performed without  difficulty. The                            patient tolerated the procedure well. The quality                            of the bowel preparation was good. The terminal                            ileum, ileocecal valve, appendiceal orifice, and                            rectum were photographed. Scope In: 2:55:05 PM Scope Out: 3:12:50 PM Scope Withdrawal Time: 0 hours 13 minutes 7 seconds  Total Procedure Duration: 0 hours 17 minutes 45 seconds  Findings:                 The terminal ileum appeared normal.                           Seven sessile polyps were found in the sigmoid                            colon, descending colon and transverse colon. The                            polyps were 3 to 6 mm in size. These polyps were                            removed with a cold snare. Resection and retrieval                            were complete.                           Non-bleeding internal hemorrhoids were found during                            retroflexion. Complications:  No immediate complications. Estimated Blood Loss:     Estimated blood loss was minimal. Impression:               - The examined portion of the ileum was normal.                           - Seven 3 to 6 mm polyps in the sigmoid colon, in                            the descending colon and in the transverse colon,                            removed with a cold snare. Resected and retrieved.                           - Non-bleeding internal hemorrhoids. Recommendation:           - Discharge patient to home (with escort).                           - Await pathology results.                           - The findings and recommendations were discussed                            with the patient. Dr Georgian Co "Lyndee Leo" [place],  02/17/2023 3:20:59 PM

## 2023-02-17 NOTE — Progress Notes (Signed)
Pt's states no medical or surgical changes since previsit or office visit. 

## 2023-02-18 ENCOUNTER — Telehealth: Payer: Self-pay

## 2023-02-18 NOTE — Telephone Encounter (Signed)
  Follow up Call-     02/17/2023    2:03 PM  Call back number  Post procedure Call Back phone  # [phone]  Permission to leave phone message Yes     Patient questions:  Do you have a fever, pain , or abdominal swelling? No. Pain Score  0 *  Have you tolerated food without any problems? Yes.    Have you been able to return to your normal activities? Yes.    Do you have any questions about your discharge instructions: Diet   No. Medications  No. Follow up visit  No.  Do you have questions or concerns about your Care? No.  Actions: * If pain score is 4 or above: No action needed, pain <4.

## 2023-02-24 ENCOUNTER — Encounter: Payer: Self-pay | Admitting: Internal Medicine

## 2023-03-09 ENCOUNTER — Telehealth: Payer: Self-pay | Admitting: Family Medicine

## 2023-03-09 NOTE — Telephone Encounter (Signed)
Pt called and wanted to know if she needed to schedule a follow up appt. She had a Colonoscopy.

## 2023-03-09 NOTE — Telephone Encounter (Signed)
Pt w

## 2023-03-12 ENCOUNTER — Ambulatory Visit: Payer: Medicaid Other | Admitting: Family Medicine

## 2023-03-22 ENCOUNTER — Other Ambulatory Visit: Payer: Self-pay

## 2023-03-22 ENCOUNTER — Emergency Department (HOSPITAL_COMMUNITY)
Admission: EM | Admit: 2023-03-22 | Discharge: 2023-03-22 | Disposition: A | Discharge status: Home / Self Care | Payer: Medicaid Other | Attending: Emergency Medicine | Admitting: Emergency Medicine

## 2023-03-22 ENCOUNTER — Emergency Department (HOSPITAL_COMMUNITY): Payer: Medicaid Other

## 2023-03-22 ENCOUNTER — Encounter (HOSPITAL_COMMUNITY): Payer: Self-pay | Admitting: *Deleted

## 2023-03-22 DIAGNOSIS — M546 Pain in thoracic spine: Secondary | ICD-10-CM | POA: Diagnosis not present

## 2023-03-22 DIAGNOSIS — R079 Chest pain, unspecified: Secondary | ICD-10-CM

## 2023-03-22 DIAGNOSIS — F17210 Nicotine dependence, cigarettes, uncomplicated: Secondary | ICD-10-CM | POA: Diagnosis not present

## 2023-03-22 DIAGNOSIS — R0602 Shortness of breath: Secondary | ICD-10-CM | POA: Diagnosis not present

## 2023-03-22 DIAGNOSIS — R0789 Other chest pain: Secondary | ICD-10-CM | POA: Diagnosis present

## 2023-03-22 LAB — MAGNESIUM: Magnesium: 1.5 mg/dL — ABNORMAL LOW (ref 1.7–2.4)

## 2023-03-22 LAB — TROPONIN I (HIGH SENSITIVITY)
Troponin I (High Sensitivity): <2 ng/L (ref <18)
Troponin I (High Sensitivity): <2 ng/L (ref <18)

## 2023-03-22 LAB — LIPASE, BLOOD: Lipase: 27 U/L (ref 11–51)

## 2023-03-22 LAB — CBC WITH DIFFERENTIAL/PLATELET
Abs Immature Granulocytes: 0.06 10*3/uL (ref 0.00–0.07)
Basophils Absolute: 0 10*3/uL (ref 0.0–0.1)
Basophils Relative: 0 %
Eosinophils Absolute: 0 10*3/uL (ref 0.0–0.5)
Eosinophils Relative: 0 %
HCT: 39.9 % (ref 36.0–46.0)
Hemoglobin: 13.6 g/dL (ref 12.0–15.0)
Immature Granulocytes: 0 %
Lymphocytes Relative: 7 %
Lymphs Abs: 1.2 10*3/uL (ref 0.7–4.0)
MCH: 32.1 pg (ref 26.0–34.0)
MCHC: 34.1 g/dL (ref 30.0–36.0)
MCV: 94.1 fL (ref 80.0–100.0)
Monocytes Absolute: 0.9 10*3/uL (ref 0.1–1.0)
Monocytes Relative: 6 %
Neutro Abs: 14.7 10*3/uL — ABNORMAL HIGH (ref 1.7–7.7)
Neutrophils Relative %: 87 %
Platelets: 189 10*3/uL (ref 150–400)
RBC: 4.24 MIL/uL (ref 3.87–5.11)
RDW: 12.7 % (ref 11.5–15.5)
WBC: 16.9 10*3/uL — ABNORMAL HIGH (ref 4.0–10.5)
nRBC: 0 % (ref 0.0–0.2)

## 2023-03-22 LAB — BRAIN NATRIURETIC PEPTIDE: B Natriuretic Peptide: 60 pg/mL (ref 0.0–100.0)

## 2023-03-22 LAB — HEPATIC FUNCTION PANEL
ALT: 13 U/L (ref 0–44)
AST: 16 U/L (ref 15–41)
Albumin: 3.6 g/dL (ref 3.5–5.0)
Alkaline Phosphatase: 52 U/L (ref 38–126)
Bilirubin, Direct: 0.1 mg/dL (ref 0.0–0.2)
Indirect Bilirubin: 0.2 mg/dL — ABNORMAL LOW (ref 0.3–0.9)
Total Bilirubin: 0.3 mg/dL (ref 0.3–1.2)
Total Protein: 6.8 g/dL (ref 6.5–8.1)

## 2023-03-22 LAB — POC URINE PREG, ED: Preg Test, Ur: NEGATIVE

## 2023-03-22 LAB — BASIC METABOLIC PANEL
Anion gap: 10 (ref 5–15)
BUN: 9 mg/dL (ref 6–20)
CO2: 23 mmol/L (ref 22–32)
Calcium: 8.7 mg/dL — ABNORMAL LOW (ref 8.9–10.3)
Chloride: 102 mmol/L (ref 98–111)
Creatinine, Ser: 0.64 mg/dL (ref 0.44–1.00)
GFR, Estimated: >60 mL/min (ref >60)
Glucose, Bld: 98 mg/dL (ref 70–99)
Potassium: 3.5 mmol/L (ref 3.5–5.1)
Sodium: 135 mmol/L (ref 135–145)

## 2023-03-22 LAB — D-DIMER, QUANTITATIVE: D-Dimer, Quant: <0.27 ug/mL-FEU (ref 0.00–0.50)

## 2023-03-22 MED ORDER — OXYCODONE-ACETAMINOPHEN 5-325 MG PO TABS
1.0000 | ORAL_TABLET | Freq: Once | ORAL | Status: AC
  Administered 2023-03-22: 1 via ORAL
  Filled 2023-03-22: qty 1

## 2023-03-22 MED ORDER — LEVOFLOXACIN 250 MG PO TABS: 750.0000 mg | ORAL_TABLET | Freq: Every day | ORAL | 0 refills | Status: AC

## 2023-03-22 MED ORDER — MAGNESIUM SULFATE 2 GM/50ML IV SOLN
2.0000 g | Freq: Once | INTRAVENOUS | Status: AC
  Administered 2023-03-22: 2 g via INTRAVENOUS
  Filled 2023-03-22: qty 50

## 2023-03-22 MED ORDER — PREDNISONE 10 MG (21) PO TBPK: ORAL_TABLET | Freq: Every day | ORAL | 0 refills | Status: DC

## 2023-03-22 NOTE — Discharge Instructions (Signed)
Prescriptions for a steroid and an antibiotic were sent to your pharmacy.  Take as prescribed.  Follow-up with your pulmonologist.  Return to the emergency department for any new or worsening symptoms of concern.

## 2023-03-22 NOTE — ED Triage Notes (Signed)
Pt c/o pain to left lower chest that started Friday and now the pain has went to underneath her breasts and into her back. Pt also c/o SOB that started yesterday.

## 2023-03-22 NOTE — ED Provider Notes (Signed)
[place] EMERGENCY DEPARTMENT AT [facility] Provider Note   CSN: [id number] Arrival date & time: 03/22/23  4098     History  Chief Complaint  Patient presents with   Chest Pain    [name] is a 37 y.o. female.   Chest Pain Associated symptoms: back pain and shortness of breath   Patient presents for chest pain.  Medical history includes pseudoseizures, tobacco use, depression, PUD, obesity, anemia, anxiety, bipolar disorder.  1 year ago, she underwent multiple doctor visits and multiple rounds of antibiotics for what was described to her as a vape induced lung injury.  She no longer vapes.  She does smoke cigarettes.  Starting 3 days ago, she experienced some central chest pain.  Since then, pain has migrated to left lower side of chest as well as bilateral thoracic back.  Pain is now more prominent in her back.  Pain is not worsened with deep inspiration.  Patient is to the ED due to concern of recurrent lung injury/pneumonia.  Current pain is 4/10 in severity.     Home Medications Prior to Admission medications   Medication Sig Start Date End Date Taking? Authorizing Provider  amphetamine-dextroamphetamine (ADDERALL XR) 5 MG 24 hr capsule Take 5 mg by mouth daily. 03/02/23  Yes [provider]  divalproex (DEPAKOTE ER) 500 MG 24 hr tablet Take 1,500 mg by mouth at bedtime. 03/08/23  Yes [provider]  docusate sodium (COLACE) 100 MG capsule Take 200 mg by mouth daily.   Yes [provider]  DULoxetine (CYMBALTA) 20 MG capsule Take 1 capsule (20 mg total) by mouth daily. 11/18/22  Yes Elsie Lincoln, MD  gabapentin (NEURONTIN) 100 MG capsule TAKE 1 CAPSULE(100 MG) BY MOUTH THREE TIMES DAILY 12/11/22  Yes Gilmore Laroche, FNP  hydrOXYzine (ATARAX) 25 MG tablet Take 25 mg by mouth 3 (three) times daily as needed for anxiety. 03/16/23  Yes [provider]  levofloxacin (LEVAQUIN) 250 MG tablet Take 3 tablets (750 mg total) by  mouth daily for 7 days. 03/22/23 03/29/23 Yes Gloris Manchester, MD  Lurasidone HCl 60 MG TABS Take 1 tablet by mouth at bedtime. 02/24/23  Yes [provider]  omeprazole (PRILOSEC) 40 MG capsule Take 1 capsule (40 mg total) by mouth 2 (two) times daily. 02/17/23  Yes Imogene Burn, MD  predniSONE (STERAPRED UNI-PAK 21 TAB) 10 MG (21) TBPK tablet Take by mouth daily. Take 6 tabs by mouth daily  for 2 days, then 5 tabs for 2 days, then 4 tabs for 2 days, then 3 tabs for 2 days, 2 tabs for 2 days, then 1 tab by mouth daily for 2 days 03/22/23  Yes Gloris Manchester, MD  traZODone (DESYREL) 100 MG tablet Take 1 tablet (100 mg total) by mouth at bedtime. 11/18/22 03/22/23 Yes Elsie Lincoln, MD  albuterol (VENTOLIN HFA) 108 (90 Base) MCG/ACT inhaler Inhale 2 puffs into the lungs every 6 (six) hours as needed for wheezing or shortness of breath. Patient not taking: Reported on 02/17/2023 08/14/22   Gilmore Laroche, FNP  ferrous sulfate 325 (65 FE) MG tablet Take 650 mg by mouth daily with breakfast.    [provider]  levonorgestrel (MIRENA) 20 MCG/DAY IUD 1 each by Intrauterine route once.    [provider]  nicotine (NICODERM CQ - DOSED IN MG/24 HOURS) 21 mg/24hr patch Place 1 patch (21 mg total) onto the skin daily. Remove before sleep. Patient not taking: Reported on 02/17/2023 12/11/22  Gilmore Laroche, FNP  sucralfate (CARAFATE) 1 g tablet Take 1 tablet (1 g total) by mouth 2 (two) times daily. Patient not taking: Reported on 03/22/2023 07/30/22   Gilmore Laroche, FNP  Vitamin D, Ergocalciferol, (DRISDOL) 1.25 MG (50000 UNIT) CAPS capsule Take 1 capsule (50,000 Units total) by mouth every 7 (seven) days. Patient not taking: Reported on 03/22/2023 07/29/22   Gilmore Laroche, FNP      Allergies    Iohexol, Sulfa antibiotics, Penicillins, and Penicillin g    Review of Systems   Review of Systems  Respiratory:  Positive for shortness of breath.   Cardiovascular:  Positive for chest pain.   Musculoskeletal:  Positive for back pain.  All other systems reviewed and are negative.   Physical Exam Updated Vital Signs BP 126/81   Pulse 75   Temp 98.3 F (36.8 C) (Oral)   Resp 18   Ht 5\' 2"  (1.575 m)   Wt 86.2 kg   SpO2 98%   BMI 34.75 kg/m  Physical Exam Vitals and nursing note reviewed.  Constitutional:      General: She is not in acute distress.    Appearance: She is well-developed. She is not ill-appearing, toxic-appearing or diaphoretic.  HENT:     Head: Normocephalic and atraumatic.  Eyes:     Conjunctiva/sclera: Conjunctivae normal.  Cardiovascular:     Rate and Rhythm: Normal rate and regular rhythm.     Heart sounds: No murmur heard. Pulmonary:     Effort: Pulmonary effort is normal. No respiratory distress.     Breath sounds: Normal breath sounds. No decreased breath sounds, wheezing, rhonchi or rales.  Chest:     Chest wall: No tenderness.  Abdominal:     Palpations: Abdomen is soft.     Tenderness: There is no abdominal tenderness.  Musculoskeletal:        General: No swelling. Normal range of motion.     Cervical back: Normal range of motion and neck supple.     Right lower leg: No edema.     Left lower leg: No edema.  Skin:    General: Skin is warm and dry.     Capillary Refill: Capillary refill takes less than 2 seconds.     Coloration: Skin is not cyanotic or pale.  Neurological:     General: No focal deficit present.     Mental Status: She is alert and oriented to person, place, and time.  Psychiatric:        Mood and Affect: Mood normal.        Behavior: Behavior normal.     ED Results / Procedures / Treatments   Labs (all labs ordered are listed, but only abnormal results are displayed) Labs Reviewed  BASIC METABOLIC PANEL - Abnormal; Notable for the following components:      Result Value   Calcium 8.7 (*)    All other components within normal limits  MAGNESIUM - Abnormal; Notable for the following components:   Magnesium 1.5  (*)    All other components within normal limits  HEPATIC FUNCTION PANEL - Abnormal; Notable for the following components:   Indirect Bilirubin 0.2 (*)    All other components within normal limits  CBC WITH DIFFERENTIAL/PLATELET - Abnormal; Notable for the following components:   WBC 16.9 (*)    Neutro Abs 14.7 (*)    All other components within normal limits  LIPASE, BLOOD  BRAIN NATRIURETIC PEPTIDE  D-DIMER, QUANTITATIVE  POC URINE PREG, ED  I-STAT BETA HCG BLOOD, ED ([facility], [facility], [facility] ONLY)  TROPONIN I (HIGH SENSITIVITY)  TROPONIN I (HIGH SENSITIVITY)    EKG EKG Interpretation  Date/Time:  Monday March 22 2023 08:11:40 EDT Ventricular Rate:  81 PR Interval:  130 QRS Duration: 100 QT Interval:  386 QTC Calculation: 448 R Axis:   65 Text Interpretation: Sinus rhythm Confirmed by Gloris Manchester [id number]) on 03/22/2023 9:36:44 AM  Radiology CT Chest Wo Contrast  Result Date: 03/22/2023 CLINICAL DATA:  Respiratory illness. EXAM: CT CHEST WITHOUT CONTRAST TECHNIQUE: Multidetector CT imaging of the chest was performed following the standard protocol without IV contrast. RADIATION DOSE REDUCTION: This exam was performed according to the departmental dose-optimization program which includes automated exposure control, adjustment of the mA and/or kV according to patient size and/or use of iterative reconstruction technique. COMPARISON:  CT 09/01/2022.  X-ray earlier 03/22/2023 and older FINDINGS: Cardiovascular: On this non IV contrast exam, the heart is nonenlarged. No pericardial effusion. The thoracic aorta has a normal course and caliber with a bovine type aortic arch, a normal variant. Mediastinum/Nodes: Small hiatal hernia. Mildly patulous thoracic esophagus. No specific abnormal lymph node enlargement identified in the axillary region, hilum or mediastinum. Lungs/Pleura: There is a small nodule along left side of the trachea measuring 4 mm on series 4, image 42 this could be a small area of debris.  This is also seen on series 5, image 90. Recommend follow-up. No pneumothorax or effusion. There are scattered diffuse patchy ground-glass areas with some interstitial septal thickening. Most confluent in the left lower lobe. Acute infiltrative process possible including infectious or inflammatory. Recommend short follow-up. Mild separate dependent atelectasis. Upper Abdomen: No acute abnormality. Musculoskeletal: Mild degenerative changes along the spine. IMPRESSION: Patchy diffuse bilateral areas of ground-glass opacities with some interstitial septal thickening. Greatest left lower lobe. These are overall increased from the previous examination has a different configuration. Previously more in the left upper lobe. This has a differential including infectious or inflammatory etiology recommend further evaluation or short follow-up. There is a 4 mm tracheal nodule along the left side which was not seen on the prior examination. This also can be assessed on short follow-up. Electronically Signed   By: Karen Kays M.D.   On: 03/22/2023 10:30   DG Chest Portable 1 View  Result Date: 03/22/2023 CLINICAL DATA:  Chest pain EXAM: PORTABLE CHEST 1 VIEW COMPARISON:  09/17/2022 FINDINGS: The heart size and mediastinal contours are within normal limits. Both lungs are clear. The visualized skeletal structures are unremarkable. IMPRESSION: No active disease. Electronically Signed   By: Ernie Avena M.D.   On: 03/22/2023 08:50    Procedures Procedures    Medications Ordered in ED Medications  magnesium sulfate IVPB 2 g 50 mL (0 g Intravenous Stopped 03/22/23 1138)  oxyCODONE-acetaminophen (PERCOCET/ROXICET) 5-325 MG per tablet 1 tablet (1 tablet Oral Given 03/22/23 1043)    ED Course/ Medical Decision Making/ A&P                             Medical Decision Making Amount and/or Complexity of Data Reviewed Labs: ordered. Radiology: ordered.  Risk Prescription drug management.   This patient  presents to the ED for concern of chest pain and shortness of breath, this involves an extensive number of treatment options, and is a complaint that carries with it a high risk of complications and morbidity.  The differential diagnosis includes ACS, PE, pneumonia, inflammatory disease  Co morbidities that complicate the patient evaluation  pseudoseizures, tobacco use, depression, PUD, obesity, anemia, anxiety, bipolar disorder   Additional history obtained:  Additional history obtained from N/A External records from outside source obtained and reviewed including EMR   Lab Tests:  I Ordered, and personally interpreted labs.  The pertinent results include: Hypomagnesemia with otherwise normal electrolytes, normal kidney function, normal troponin, normal D-dimer, normal BNP.  A leukocytosis is present.   Imaging Studies ordered:  I ordered imaging studies including chest x-ray, CT chest I independently visualized and interpreted imaging which showed diffuse patchy areas of groundglass opacities bilaterally.  Concern for infectious and/or inflammation. I agree with the radiologist interpretation   Cardiac Monitoring: / EKG:  The patient was maintained on a cardiac monitor.  I personally viewed and interpreted the cardiac monitored which showed an underlying rhythm of: Sinus rhythm   Problem List / ED Course / Critical interventions / Medication management  Patient presents for chest pain, back pain, and shortness of breath.  Symptoms have been present and evolving over the past 3 days.  She had similar symptoms 1 year ago that she ultimately saw a lung specialist for.  Lung specialist told her that it was a vape induced injury.  Patient no longer vapes.  Vital signs on arrival, including SpO2 on room air are normal.  Patient is well-appearing on exam.  Her breathing appears unlabored.  Current pain is 4/10 in severity and more prominent in her thoracic back.  Lungs are clear to  auscultation.  Patient declines any pain medication at this time.  Lab work and x-ray were ordered.  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  Patient states that she has received dye 1 time in the past.  She had the sensation of burning in her genital area but denies any other symptoms at time of contrast administration.  Fortunately, D-dimer was negative.  She did undergo noncontrasted CT scan of chest which did show diffuse patchy groundglass opacities bilaterally.  Patient to be treated for infectious and inflammatory cause.  Prednisone and antibiotics were prescribed.  Patient to follow-up with her pulmonologist.  Patient was discharged in stable condition. I ordered medication including magnesium sulfate for hypomagnesemia; Percocet for analgesia Reevaluation of the patient after these medicines showed that the patient improved I have reviewed the patients home medicines and have made adjustments as needed   Social Determinants of Health:  Has PCP        Final Clinical Impression(s) / ED Diagnoses Final diagnoses:  Chest pain, unspecified type    Rx / DC Orders ED Discharge Orders          Ordered    predniSONE (STERAPRED UNI-PAK 21 TAB) 10 MG (21) TBPK tablet  Daily        03/22/23 1320    levofloxacin (LEVAQUIN) 250 MG tablet  Daily        03/22/23 1320              Gloris Manchester, MD 03/22/23 1321

## 2023-03-23 ENCOUNTER — Telehealth: Payer: Self-pay | Admitting: Pulmonary Disease

## 2023-03-23 ENCOUNTER — Telehealth: Payer: Self-pay

## 2023-03-23 NOTE — Telephone Encounter (Signed)
PT calling for an over due FU appt. (10/27/22) Last AVS notes states:  CT scan chest without contrast to evaluate abnormal chest x-ray, groundglass changes at the base of the lungs on recent CT      Did we need to sched this before appt May 15th still? Sending to [facility] and Triage.

## 2023-03-23 NOTE — Telephone Encounter (Signed)
Patient just had a ct yesterday of her chest there are no open orders for [place] to schedule

## 2023-03-23 NOTE — Transitions of Care (Post Inpatient/ED Visit) (Signed)
   03/23/2023  Name: [name] MRN: [id number] DOB: February 22, 1986  Today's TOC FU Call Status: Today's TOC FU Call Status:: Unsuccessul Call (1st Attempt) Unsuccessful Call (1st Attempt) Date: 03/23/23  Attempted to reach the patient regarding the most recent Inpatient/ED visit.  Follow Up Plan: Additional outreach attempts will be made to reach the patient to complete the Transitions of Care (Post Inpatient/ED visit) call.   Signature  Kandis Fantasia, LPN [facility] Health Advisor [place] [facility] You Are. We Are. One [employer] # [phone]

## 2023-03-26 ENCOUNTER — Other Ambulatory Visit: Payer: Self-pay | Admitting: Family Medicine

## 2023-03-26 DIAGNOSIS — E559 Vitamin D deficiency, unspecified: Secondary | ICD-10-CM

## 2023-04-06 ENCOUNTER — Ambulatory Visit: Payer: Medicaid Other | Admitting: Pulmonary Disease

## 2023-04-13 ENCOUNTER — Other Ambulatory Visit: Payer: Self-pay | Admitting: Family Medicine

## 2023-04-13 DIAGNOSIS — M545 Low back pain, unspecified: Secondary | ICD-10-CM

## 2023-04-15 ENCOUNTER — Encounter: Payer: Self-pay | Admitting: Pulmonary Disease

## 2023-05-05 ENCOUNTER — Ambulatory Visit: Payer: Medicaid Other | Admitting: Internal Medicine

## 2023-07-07 ENCOUNTER — Ambulatory Visit: Payer: MEDICAID | Admitting: Obstetrics & Gynecology

## 2023-07-08 ENCOUNTER — Ambulatory Visit: Payer: Medicaid Other | Admitting: Obstetrics & Gynecology

## 2023-08-01 ENCOUNTER — Other Ambulatory Visit: Payer: Self-pay

## 2023-08-01 ENCOUNTER — Emergency Department (HOSPITAL_COMMUNITY)
Admission: EM | Admit: 2023-08-01 | Discharge: 2023-08-01 | Disposition: A | Discharge status: Home / Self Care | Payer: Self-pay | Attending: Student | Admitting: Student

## 2023-08-01 ENCOUNTER — Emergency Department (HOSPITAL_COMMUNITY): Payer: Self-pay

## 2023-08-01 ENCOUNTER — Encounter (HOSPITAL_COMMUNITY): Payer: Self-pay

## 2023-08-01 DIAGNOSIS — R051 Acute cough: Secondary | ICD-10-CM

## 2023-08-01 DIAGNOSIS — Z20822 Contact with and (suspected) exposure to covid-19: Secondary | ICD-10-CM | POA: Insufficient documentation

## 2023-08-01 DIAGNOSIS — F17210 Nicotine dependence, cigarettes, uncomplicated: Secondary | ICD-10-CM | POA: Insufficient documentation

## 2023-08-01 DIAGNOSIS — E039 Hypothyroidism, unspecified: Secondary | ICD-10-CM | POA: Insufficient documentation

## 2023-08-01 DIAGNOSIS — R0602 Shortness of breath: Secondary | ICD-10-CM | POA: Insufficient documentation

## 2023-08-01 LAB — COMPREHENSIVE METABOLIC PANEL
ALT: 10 U/L (ref 0–44)
AST: 16 U/L (ref 15–41)
Albumin: 3.6 g/dL (ref 3.5–5.0)
Alkaline Phosphatase: 54 U/L (ref 38–126)
Anion gap: 12 (ref 5–15)
BUN: 7 mg/dL (ref 6–20)
CO2: 22 mmol/L (ref 22–32)
Calcium: 8.4 mg/dL — ABNORMAL LOW (ref 8.9–10.3)
Chloride: 101 mmol/L (ref 98–111)
Creatinine, Ser: 0.79 mg/dL (ref 0.44–1.00)
GFR, Estimated: >60 mL/min (ref >60)
Glucose, Bld: 119 mg/dL — ABNORMAL HIGH (ref 70–99)
Potassium: 3.3 mmol/L — ABNORMAL LOW (ref 3.5–5.1)
Sodium: 135 mmol/L (ref 135–145)
Total Bilirubin: 0.5 mg/dL (ref 0.3–1.2)
Total Protein: 6.9 g/dL (ref 6.5–8.1)

## 2023-08-01 LAB — CBC WITH DIFFERENTIAL/PLATELET
Abs Immature Granulocytes: 0.04 10*3/uL (ref 0.00–0.07)
Basophils Absolute: 0 10*3/uL (ref 0.0–0.1)
Basophils Relative: 0 %
Eosinophils Absolute: 0 10*3/uL (ref 0.0–0.5)
Eosinophils Relative: 0 %
HCT: 43.1 % (ref 36.0–46.0)
Hemoglobin: 14.7 g/dL (ref 12.0–15.0)
Immature Granulocytes: 0 %
Lymphocytes Relative: 4 %
Lymphs Abs: 0.5 10*3/uL — ABNORMAL LOW (ref 0.7–4.0)
MCH: 33.6 pg (ref 26.0–34.0)
MCHC: 34.1 g/dL (ref 30.0–36.0)
MCV: 98.4 fL (ref 80.0–100.0)
Monocytes Absolute: 0.6 10*3/uL (ref 0.1–1.0)
Monocytes Relative: 5 %
Neutro Abs: 11.6 10*3/uL — ABNORMAL HIGH (ref 1.7–7.7)
Neutrophils Relative %: 91 %
Platelets: 169 10*3/uL (ref 150–400)
RBC: 4.38 MIL/uL (ref 3.87–5.11)
RDW: 12 % (ref 11.5–15.5)
WBC: 12.8 10*3/uL — ABNORMAL HIGH (ref 4.0–10.5)
nRBC: 0 % (ref 0.0–0.2)

## 2023-08-01 LAB — TROPONIN I (HIGH SENSITIVITY): Troponin I (High Sensitivity): 2 ng/L (ref <18)

## 2023-08-01 LAB — BRAIN NATRIURETIC PEPTIDE: B Natriuretic Peptide: 43 pg/mL (ref 0.0–100.0)

## 2023-08-01 LAB — SARS CORONAVIRUS 2 BY RT PCR: SARS Coronavirus 2 by RT PCR: NEGATIVE

## 2023-08-01 MED ORDER — ACETAMINOPHEN 500 MG PO TABS
1000.0000 mg | ORAL_TABLET | Freq: Once | ORAL | Status: AC
  Administered 2023-08-01: 1000 mg via ORAL
  Filled 2023-08-01: qty 2

## 2023-08-01 MED ORDER — BENZONATATE 100 MG PO CAPS: 100.0000 mg | ORAL_CAPSULE | Freq: Three times a day (TID) | ORAL | 0 refills | Status: DC

## 2023-08-01 MED ORDER — AMOXICILLIN-POT CLAVULANATE 875-125 MG PO TABS: 1.0000 | ORAL_TABLET | Freq: Two times a day (BID) | ORAL | 0 refills | Status: DC
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
## 2023-08-01 NOTE — ED Triage Notes (Signed)
Pt c/o coughing and SOB for 2 days. Woke up this am with extreme SOB.

## 2023-08-04 ENCOUNTER — Ambulatory Visit: Payer: MEDICAID | Admitting: Obstetrics & Gynecology

## 2023-08-19 ENCOUNTER — Other Ambulatory Visit: Payer: Self-pay | Admitting: Family Medicine

## 2023-08-19 DIAGNOSIS — E559 Vitamin D deficiency, unspecified: Secondary | ICD-10-CM

## 2023-10-13 ENCOUNTER — Ambulatory Visit: Payer: Self-pay | Admitting: Family Medicine

## 2023-12-30 ENCOUNTER — Other Ambulatory Visit: Payer: MEDICAID

## 2024-01-05 ENCOUNTER — Other Ambulatory Visit: Payer: MEDICAID

## 2024-01-17 ENCOUNTER — Ambulatory Visit: Payer: Self-pay | Admitting: Family Medicine

## 2024-01-17 NOTE — Telephone Encounter (Signed)
 Chief Complaint: Hoarseness Symptoms: Difficulty speaking Frequency: Ongoing x 1 wk Pertinent Negatives: Patient denies fever, rash, ST Disposition: [] ED /[] Urgent Care (no appt availability in office) / [x] Appointment(In office/virtual)/ []  [facility]/ [] Home Care/ [] Refused Recommended Disposition /[] [place] [facility]/ []  Follow-up with PCP Additional Notes: Pt reports she noted hoarseness in her voice about 1 week ago but only at night. She reports for the last 2-3 days she has been experiencing the hoarseness all the time. She notes she sometimes feels she has to force herself to be able to speak and often isn't able to speak louder than a whisper. OV scheduled. This RN educated pt on home care, new-worsening symptoms, when to call back/seek emergent care. Pt verbalized understanding and agrees to plan.    Copied from CRM [id number]. Topic: Clinical - Medical Advice >> Jan 17, 2024  5:52 PM Martha Clan wrote: Reason for CRM: Patient has sore throat for extended period of time and did not answer clinics call backs, notes states to talk to nurse triage. Reason for Disposition  [1] Hoarseness AND [2] risk factor (e.g., recent neck surgery or intubation, smoker, unexpected weight loss)  (Exception: Current common cold or mild URI symptoms.)  Answer Assessment - Initial Assessment Questions 1. DESCRIPTION: "Describe your voice." (e.g., coarse, raspy, weaker, airy, scratchy, deeper)     "In and out" difficult to speak 2. SEVERITY: "How bad is it?"   - MILD: doesn't interfere with normal activities   - MODERATE: interferes with normal activities such as school or work   - SEVERE: only able to whisper.     Sometimes only able to whisper 3. ONSET: "When did the hoarseness begin?"     About a week ago 4. COUGH: "Is there a cough?" If Yes, ask: "How bad is it?"     None 5. FEVER: "Do you have a fever?" If Yes, ask: "What is your temperature, how was it measured, and when did it  start?"     None 6. ALLERGIES: "Any allergy symptoms?" If Yes, ask: "What are they?"     None 7. IRRITANTS: "Do you smoke?" "Have you been exposed to any irritating fumes?" (e.g., smoke)     Pt is a smoker 8. CAUSE: "What do you think is causing the hoarseness?"     Unknown 9. OTHER SYMPTOMS: "Do you have any other symptoms?" (e.g., breathing difficulty, fever, foreign body, lymph node swelling in neck, rash, sore throat, weight loss)     None  Protocols used: Hoarseness-A-AH

## 2024-01-17 NOTE — Telephone Encounter (Addendum)
 3rd and final attempt to contact patient, no answer, lv   This RN attempted return call #2. No answer. LVM.   Patient called, left VM to return the call to the office to speak to the NT.    Copied From CRM [id number]. Reason for Triage: Patient has been hoarse for about a week with a sore throat. It would go away but now its long lasting. Patient's call back number is [phone]

## 2024-01-17 NOTE — Telephone Encounter (Signed)
 Patient called, left VM to return the call to the office to speak to the NT.    Copied From CRM [id number]. Reason for Triage: Patient has been hoarse for about a week with a sore throat. It would go away but now its long lasting. Patient's call back number is [phone]

## 2024-01-19 ENCOUNTER — Ambulatory Visit: Payer: Self-pay | Admitting: Internal Medicine

## 2024-01-20 NOTE — Telephone Encounter (Signed)
Schedule an office visit.

## 2024-01-21 NOTE — Telephone Encounter (Signed)
 Called patent left voicemail to return call or go to [place] and schedule an appointment

## 2024-01-25 ENCOUNTER — Telehealth (INDEPENDENT_AMBULATORY_CARE_PROVIDER_SITE_OTHER): Payer: MEDICAID | Admitting: Internal Medicine

## 2024-01-25 DIAGNOSIS — R058 Other specified cough: Secondary | ICD-10-CM

## 2024-01-25 DIAGNOSIS — R490 Dysphonia: Secondary | ICD-10-CM | POA: Diagnosis not present

## 2024-01-25 DIAGNOSIS — J040 Acute laryngitis: Secondary | ICD-10-CM

## 2024-01-25 MED ORDER — PREDNISONE 10 MG (21) PO TBPK: ORAL_TABLET | ORAL | 0 refills | Status: DC

## 2024-01-25 NOTE — Progress Notes (Signed)
 Virtual Visit via Video Note  I connected with [name] on 01/25/24 at  2:00 PM EST by a video enabled telemedicine application and verified that I am speaking with the correct person using two identifiers.  Patient Location: Home Provider Location: Home Office  I discussed the limitations, risks, security, and privacy concerns of performing an evaluation and management service by video and the availability of in person appointments. I also discussed with the patient that there may be a patient responsible charge related to this service. The patient expressed understanding and agreed to proceed.  Subjective: PCP: Paula Laroche, FNP  Chief Complaint  Patient presents with   Hoarse    Hoarseness with a cough for about a week and half now   [name] has been evaluated today for an acute visit through video encounter endorsing a one and a half week history of cough and hoarseness.  Her cough is nonproductive.  Hoarseness is worst in the morning and improves after drinking liquids.  Denies shortness of breath and fever/chills.  She has tried over-the-counter cough and cold medications for symptom relief.  She has a well-documented history of GERD and endorses compliance with prescribed medications.  She states that current symptoms are not the same as what she has experienced with reflux previously.   ROS: Per HPI  Current Outpatient Medications:    predniSONE (STERAPRED UNI-PAK 21 TAB) 10 MG (21) TBPK tablet, Use as directed., Disp: 21 each, Rfl: 0   albuterol (VENTOLIN HFA) 108 (90 Base) MCG/ACT inhaler, Inhale 2 puffs into the lungs every 6 (six) hours as needed for wheezing or shortness of breath. (Patient not taking: Reported on 02/17/2023), Disp: 8 g, Rfl: 0   amoxicillin-clavulanate (AUGMENTIN) 875-125 MG tablet, Take 1 tablet by mouth every 12 (twelve) hours., Disp: 14 tablet, Rfl: 0   amphetamine-dextroamphetamine (ADDERALL XR) 5 MG 24 hr capsule, Take 5 mg by mouth  daily., Disp: , Rfl:    benzonatate (TESSALON) 100 MG capsule, Take 1 capsule (100 mg total) by mouth every 8 (eight) hours., Disp: 21 capsule, Rfl: 0   divalproex (DEPAKOTE ER) 500 MG 24 hr tablet, Take 1,500 mg by mouth at bedtime., Disp: , Rfl:    docusate sodium (COLACE) 100 MG capsule, Take 200 mg by mouth daily., Disp: , Rfl:    DULoxetine (CYMBALTA) 20 MG capsule, Take 1 capsule (20 mg total) by mouth daily., Disp: 30 capsule, Rfl: 0   ferrous sulfate 325 (65 FE) MG tablet, Take 650 mg by mouth daily with breakfast., Disp: , Rfl:    gabapentin (NEURONTIN) 100 MG capsule, TAKE 1 CAPSULE(100 MG) BY MOUTH THREE TIMES DAILY, Disp: 90 capsule, Rfl: 2   hydrOXYzine (ATARAX) 25 MG tablet, Take 25 mg by mouth 3 (three) times daily as needed for anxiety., Disp: , Rfl:    levonorgestrel (MIRENA) 20 MCG/DAY IUD, 1 each by Intrauterine route once., Disp: , Rfl:    Lurasidone HCl 60 MG TABS, Take 1 tablet by mouth at bedtime., Disp: , Rfl:    nicotine (NICODERM CQ - DOSED IN MG/24 HOURS) 21 mg/24hr patch, Place 1 patch (21 mg total) onto the skin daily. Remove before sleep. (Patient not taking: Reported on 02/17/2023), Disp: 90 patch, Rfl: 1   omeprazole (PRILOSEC) 40 MG capsule, Take 1 capsule (40 mg total) by mouth 2 (two) times daily., Disp: 90 capsule, Rfl: 3   sucralfate (CARAFATE) 1 g tablet, Take 1 tablet (1 g total) by mouth 2 (two) times daily. (  Patient not taking: Reported on 03/22/2023), Disp: 90 tablet, Rfl: 1   traZODone (DESYREL) 100 MG tablet, Take 1 tablet (100 mg total) by mouth at bedtime., Disp: 30 tablet, Rfl: 0   Vitamin D, Ergocalciferol, (DRISDOL) 1.25 MG (50000 UNIT) CAPS capsule, TAKE 1 CAPSULE BY MOUTH EVERY 7 DAYS, Disp: 5 capsule, Rfl: 2  Assessment and Plan:  Hoarseness Assessment & Plan: Evaluated today through video encounter with concern for hoarseness.  Her voice does not sound appreciably hoarse during today's encounter, however she states that symptoms are worse in the  morning and improves after drinking liquids.  She endorses an associated nonproductive cough.  Denies fever/chills.  Symptoms are not the same as what she has experienced with reflux previously.  She endorses compliance with omeprazole.  Concern for acute laryngitis.  Treatment options reviewed.  I have prescribed a prednisone pack.  We discussed that there are limited indications for antibiotics.  No antibiotics were prescribed today.  She will return to care if symptoms worsen or fail to improve.  Follow Up Instructions: Return if symptoms worsen or fail to improve.   I discussed the assessment and treatment plan with the patient. The patient was provided an opportunity to ask questions, and all were answered. The patient agreed with the plan and demonstrated an understanding of the instructions.   The patient was advised to call back or seek an in-person evaluation if the symptoms worsen or if the condition fails to improve as anticipated.  The above assessment and management plan was discussed with the patient. The patient verbalized understanding of and has agreed to the management plan.   Paula Lade, MD

## 2024-01-25 NOTE — Assessment & Plan Note (Signed)
 Evaluated today through video encounter with concern for hoarseness.  Her voice does not sound appreciably hoarse during today's encounter, however she states that symptoms are worse in the morning and improves after drinking liquids.  She endorses an associated nonproductive cough.  Denies fever/chills.  Symptoms are not the same as what she has experienced with reflux previously.  She endorses compliance with omeprazole.  Concern for acute laryngitis.  Treatment options reviewed.  I have prescribed a prednisone pack.  We discussed that there are limited indications for antibiotics.  No antibiotics were prescribed today.  She will return to care if symptoms worsen or fail to improve.

## 2024-01-27 ENCOUNTER — Encounter: Payer: Self-pay | Admitting: Family Medicine

## 2024-03-06 ENCOUNTER — Ambulatory Visit: Payer: MEDICAID | Admitting: Obstetrics and Gynecology

## 2024-09-18 ENCOUNTER — Ambulatory Visit: Payer: MEDICAID | Admitting: Adult Health

## 2024-10-08 ENCOUNTER — Ambulatory Visit
Admission: EM | Admit: 2024-10-08 | Discharge: 2024-10-08 | Disposition: A | Discharge status: Home / Self Care | Payer: MEDICAID | Attending: Family Medicine | Admitting: Family Medicine

## 2024-10-08 DIAGNOSIS — K047 Periapical abscess without sinus: Secondary | ICD-10-CM

## 2024-10-08 DIAGNOSIS — R220 Localized swelling, mass and lump, head: Secondary | ICD-10-CM

## 2024-10-08 MED ORDER — CLINDAMYCIN HCL 300 MG PO CAPS: 300.0000 mg | ORAL_CAPSULE | Freq: Two times a day (BID) | ORAL | 0 refills | Status: DC

## 2024-10-08 MED ORDER — CHLORHEXIDINE GLUCONATE 0.12 % MT SOLN: 15.0000 mL | Freq: Two times a day (BID) | OROMUCOSAL | 0 refills | Status: DC

## 2024-10-08 MED ORDER — LIDOCAINE VISCOUS HCL 2 % MT SOLN: 10.0000 mL | OROMUCOSAL | 0 refills | Status: DC | PRN

## 2024-10-08 NOTE — ED Triage Notes (Addendum)
 Pt reports left side facial pain and swelling x 1 day. Has a tooth she needs to have pulled.
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
## 2024-10-08 NOTE — Discharge Instructions (Signed)
Follow up with dentist as soon as possible.

## 2024-10-30 DIAGNOSIS — Z5329 Procedure and treatment not carried out because of patient's decision for other reasons: Secondary | ICD-10-CM | POA: Insufficient documentation

## 2024-10-30 DIAGNOSIS — F1410 Cocaine abuse, uncomplicated: Secondary | ICD-10-CM | POA: Insufficient documentation

## 2024-10-30 DIAGNOSIS — F1290 Cannabis use, unspecified, uncomplicated: Secondary | ICD-10-CM | POA: Insufficient documentation

## 2024-10-30 DIAGNOSIS — Z793 Long term (current) use of hormonal contraceptives: Secondary | ICD-10-CM | POA: Insufficient documentation

## 2024-10-30 DIAGNOSIS — F17210 Nicotine dependence, cigarettes, uncomplicated: Secondary | ICD-10-CM | POA: Insufficient documentation

## 2024-10-30 DIAGNOSIS — F411 Generalized anxiety disorder: Secondary | ICD-10-CM | POA: Insufficient documentation

## 2024-10-30 DIAGNOSIS — Z560 Unemployment, unspecified: Secondary | ICD-10-CM | POA: Insufficient documentation

## 2024-10-30 DIAGNOSIS — Z79899 Other long term (current) drug therapy: Secondary | ICD-10-CM | POA: Insufficient documentation

## 2024-10-30 DIAGNOSIS — G4700 Insomnia, unspecified: Secondary | ICD-10-CM | POA: Insufficient documentation

## 2024-10-30 DIAGNOSIS — F332 Major depressive disorder, recurrent severe without psychotic features: Secondary | ICD-10-CM | POA: Insufficient documentation

## 2024-10-31 ENCOUNTER — Inpatient Hospital Stay (HOSPITAL_COMMUNITY)
Admission: AD | Admit: 2024-10-31 | Discharge: 2024-11-02 | DRG: 885 | Disposition: A | Payer: MEDICAID | Source: Intra-hospital

## 2024-10-31 ENCOUNTER — Other Ambulatory Visit: Payer: Self-pay

## 2024-10-31 ENCOUNTER — Ambulatory Visit (INDEPENDENT_AMBULATORY_CARE_PROVIDER_SITE_OTHER)
Admission: EM | Admit: 2024-10-31 | Discharge: 2024-10-31 | Disposition: A | Discharge status: Other Acute Inpatient Hospital | Payer: MEDICAID | Attending: Psychiatry | Admitting: Psychiatry

## 2024-10-31 ENCOUNTER — Ambulatory Visit (HOSPITAL_COMMUNITY)
Admission: EM | Admit: 2024-10-31 | Discharge: 2024-10-31 | Payer: MEDICAID | Attending: Psychiatry | Admitting: Psychiatry

## 2024-10-31 ENCOUNTER — Encounter (HOSPITAL_COMMUNITY): Payer: Self-pay

## 2024-10-31 DIAGNOSIS — F1420 Cocaine dependence, uncomplicated: Secondary | ICD-10-CM | POA: Diagnosis not present

## 2024-10-31 DIAGNOSIS — F1290 Cannabis use, unspecified, uncomplicated: Secondary | ICD-10-CM | POA: Diagnosis not present

## 2024-10-31 DIAGNOSIS — G4700 Insomnia, unspecified: Secondary | ICD-10-CM | POA: Diagnosis not present

## 2024-10-31 DIAGNOSIS — F411 Generalized anxiety disorder: Secondary | ICD-10-CM | POA: Diagnosis not present

## 2024-10-31 DIAGNOSIS — F17210 Nicotine dependence, cigarettes, uncomplicated: Secondary | ICD-10-CM | POA: Diagnosis not present

## 2024-10-31 DIAGNOSIS — Z5329 Procedure and treatment not carried out because of patient's decision for other reasons: Secondary | ICD-10-CM | POA: Diagnosis not present

## 2024-10-31 DIAGNOSIS — F1410 Cocaine abuse, uncomplicated: Secondary | ICD-10-CM

## 2024-10-31 DIAGNOSIS — F339 Major depressive disorder, recurrent, unspecified: Secondary | ICD-10-CM

## 2024-10-31 DIAGNOSIS — F332 Major depressive disorder, recurrent severe without psychotic features: Secondary | ICD-10-CM | POA: Diagnosis not present

## 2024-10-31 DIAGNOSIS — Z793 Long term (current) use of hormonal contraceptives: Secondary | ICD-10-CM | POA: Diagnosis not present

## 2024-10-31 DIAGNOSIS — Z560 Unemployment, unspecified: Secondary | ICD-10-CM | POA: Diagnosis not present

## 2024-10-31 LAB — HEMOGLOBIN A1C
Hgb A1c MFr Bld: 5.1 % (ref 4.8–5.6)
Mean Plasma Glucose: 99.67 mg/dL

## 2024-10-31 LAB — CBC WITH DIFFERENTIAL/PLATELET
Abs Immature Granulocytes: 0.04 K/uL (ref 0.00–0.07)
Basophils Absolute: 0 K/uL (ref 0.0–0.1)
Basophils Relative: 0 %
Eosinophils Absolute: 0 K/uL (ref 0.0–0.5)
Eosinophils Relative: 0 %
HCT: 46 % (ref 36.0–46.0)
Hemoglobin: 15.7 g/dL — ABNORMAL HIGH (ref 12.0–15.0)
Immature Granulocytes: 0 %
Lymphocytes Relative: 25 %
Lymphs Abs: 3 K/uL (ref 0.7–4.0)
MCH: 33.6 pg (ref 26.0–34.0)
MCHC: 34.1 g/dL (ref 30.0–36.0)
MCV: 98.5 fL (ref 80.0–100.0)
Monocytes Absolute: 0.9 K/uL (ref 0.1–1.0)
Monocytes Relative: 7 %
Neutro Abs: 8.1 K/uL — ABNORMAL HIGH (ref 1.7–7.7)
Neutrophils Relative %: 68 %
Platelets: 326 K/uL (ref 150–400)
RBC: 4.67 MIL/uL (ref 3.87–5.11)
RDW: 12.6 % (ref 11.5–15.5)
WBC: 12 K/uL — ABNORMAL HIGH (ref 4.0–10.5)
nRBC: 0 % (ref 0.0–0.2)

## 2024-10-31 LAB — COMPREHENSIVE METABOLIC PANEL WITH GFR
ALT: 14 U/L (ref 0–44)
AST: 17 U/L (ref 15–41)
Albumin: 4.2 g/dL (ref 3.5–5.0)
Alkaline Phosphatase: 75 U/L (ref 38–126)
Anion gap: 9 (ref 5–15)
BUN: <5 mg/dL — ABNORMAL LOW (ref 6–20)
CO2: 29 mmol/L (ref 22–32)
Calcium: 9.6 mg/dL (ref 8.9–10.3)
Chloride: 100 mmol/L (ref 98–111)
Creatinine, Ser: 0.74 mg/dL (ref 0.44–1.00)
GFR, Estimated: >60 mL/min (ref >60)
Glucose, Bld: 51 mg/dL — ABNORMAL LOW (ref 70–99)
Potassium: 3.4 mmol/L — ABNORMAL LOW (ref 3.5–5.1)
Sodium: 138 mmol/L (ref 135–145)
Total Bilirubin: 0.5 mg/dL (ref 0.0–1.2)
Total Protein: 7.5 g/dL (ref 6.5–8.1)

## 2024-10-31 LAB — GLUCOSE, CAPILLARY: Glucose-Capillary: 90 mg/dL (ref 70–99)

## 2024-10-31 LAB — URINALYSIS, ROUTINE W REFLEX MICROSCOPIC
Bilirubin Urine: NEGATIVE
Glucose, UA: NEGATIVE mg/dL
Hgb urine dipstick: NEGATIVE
Ketones, ur: NEGATIVE mg/dL
Leukocytes,Ua: NEGATIVE
Nitrite: NEGATIVE
Protein, ur: NEGATIVE mg/dL
Specific Gravity, Urine: 1.018 (ref 1.005–1.030)
pH: 5 (ref 5.0–8.0)

## 2024-10-31 LAB — POCT URINE DRUG SCREEN - MANUAL ENTRY (I-SCREEN)
POC Amphetamine UR: NOT DETECTED
POC Buprenorphine (BUP): NOT DETECTED
POC Cocaine UR: POSITIVE — AB
POC Marijuana UR: POSITIVE — AB
POC Methadone UR: NOT DETECTED
POC Methamphetamine UR: NOT DETECTED
POC Morphine: NOT DETECTED
POC Oxazepam (BZO): NOT DETECTED
POC Oxycodone UR: NOT DETECTED
POC Secobarbital (BAR): NOT DETECTED

## 2024-10-31 LAB — POC URINE PREG, ED: Preg Test, Ur: NEGATIVE

## 2024-10-31 LAB — ETHANOL: Alcohol, Ethyl (B): <15 mg/dL (ref <15)

## 2024-10-31 LAB — MAGNESIUM: Magnesium: 1.8 mg/dL (ref 1.7–2.4)

## 2024-10-31 LAB — LIPID PANEL
Cholesterol: 167 mg/dL (ref 0–200)
HDL: 41 mg/dL (ref >40)
LDL Cholesterol: 105 mg/dL — ABNORMAL HIGH (ref 0–99)
Total CHOL/HDL Ratio: 4.1 ratio
Triglycerides: 103 mg/dL (ref <150)
VLDL: 21 mg/dL (ref 0–40)

## 2024-10-31 MED ORDER — TRAZODONE HCL 50 MG PO TABS
50.0000 mg | ORAL_TABLET | Freq: Every evening | ORAL | Status: DC | PRN
  Administered 2024-10-31 – 2024-11-01 (×2): 50 mg via ORAL
  Filled 2024-10-31 (×2): qty 1

## 2024-10-31 MED ORDER — HALOPERIDOL 5 MG PO TABS: 5.0000 mg | ORAL_TABLET | Freq: Three times a day (TID) | ORAL | Status: DC | PRN

## 2024-10-31 MED ORDER — HALOPERIDOL LACTATE 5 MG/ML IJ SOLN: 5.0000 mg | Freq: Three times a day (TID) | INTRAMUSCULAR | Status: DC | PRN

## 2024-10-31 MED ORDER — MAGNESIUM HYDROXIDE 400 MG/5ML PO SUSP: 30.0000 mL | Freq: Every day | ORAL | Status: DC | PRN

## 2024-10-31 MED ORDER — ACETAMINOPHEN 325 MG PO TABS: 650.0000 mg | ORAL_TABLET | Freq: Four times a day (QID) | ORAL | Status: DC | PRN

## 2024-10-31 MED ORDER — NICOTINE POLACRILEX 2 MG MT GUM
2.0000 mg | CHEWING_GUM | OROMUCOSAL | Status: DC | PRN
  Administered 2024-11-01 – 2024-11-02 (×2): 2 mg via ORAL
  Filled 2024-10-31 (×2): qty 1

## 2024-10-31 MED ORDER — ACETAMINOPHEN 325 MG PO TABS
650.0000 mg | ORAL_TABLET | Freq: Four times a day (QID) | ORAL | Status: DC | PRN
  Administered 2024-11-01 (×2): 650 mg via ORAL
  Filled 2024-10-31 (×2): qty 2

## 2024-10-31 MED ORDER — DIPHENHYDRAMINE HCL 50 MG/ML IJ SOLN: 50.0000 mg | Freq: Three times a day (TID) | INTRAMUSCULAR | Status: DC | PRN

## 2024-10-31 MED ORDER — LORAZEPAM 2 MG/ML IJ SOLN: 2.0000 mg | Freq: Three times a day (TID) | INTRAMUSCULAR | Status: DC | PRN

## 2024-10-31 MED ORDER — HALOPERIDOL LACTATE 5 MG/ML IJ SOLN: 10.0000 mg | Freq: Three times a day (TID) | INTRAMUSCULAR | Status: DC | PRN

## 2024-10-31 MED ORDER — ALUM & MAG HYDROXIDE-SIMETH 200-200-20 MG/5ML PO SUSP
30.0000 mL | ORAL | Status: DC | PRN
  Administered 2024-10-31: 30 mL via ORAL
  Filled 2024-10-31: qty 30

## 2024-10-31 MED ORDER — HYDROXYZINE HCL 25 MG PO TABS
25.0000 mg | ORAL_TABLET | Freq: Three times a day (TID) | ORAL | Status: DC | PRN
  Administered 2024-10-31 – 2024-11-01 (×2): 25 mg via ORAL
  Filled 2024-10-31 (×2): qty 1

## 2024-10-31 MED ORDER — ALUM & MAG HYDROXIDE-SIMETH 200-200-20 MG/5ML PO SUSP
30.0000 mL | ORAL | Status: DC | PRN
  Administered 2024-10-31 – 2024-11-01 (×2): 30 mL via ORAL
  Filled 2024-10-31 (×2): qty 30

## 2024-10-31 MED ORDER — DIPHENHYDRAMINE HCL 25 MG PO CAPS: 50.0000 mg | ORAL_CAPSULE | Freq: Three times a day (TID) | ORAL | Status: DC | PRN

## 2024-10-31 MED ORDER — TRAZODONE HCL 50 MG PO TABS: 50.0000 mg | ORAL_TABLET | Freq: Every evening | ORAL | Status: DC | PRN

## 2024-10-31 MED ORDER — HYDROXYZINE HCL 25 MG PO TABS: 25.0000 mg | ORAL_TABLET | Freq: Three times a day (TID) | ORAL | Status: DC | PRN

## 2024-10-31 MED ORDER — DIPHENHYDRAMINE HCL 50 MG PO CAPS: 50.0000 mg | ORAL_CAPSULE | Freq: Three times a day (TID) | ORAL | Status: DC | PRN

## 2024-10-31 NOTE — Progress Notes (Signed)
 Report called to [employer] .  Verbalized understanding. Pt continues to complain of Heartburn.   Provider notified.

## 2024-10-31 NOTE — Plan of Care (Signed)
   Problem: Education: Goal: Knowledge of [facility] Education information/materials will improve Outcome: Progressing   Problem: Activity: Goal: Interest or engagement in activities will improve Outcome: Progressing   Problem: Coping: Goal: Ability to verbalize frustrations and anger appropriately will improve Outcome: Progressing   Problem: Safety: Goal: Periods of time without injury will increase Outcome: Progressing

## 2024-10-31 NOTE — ED Provider Notes (Signed)
 [facility]gent Care Continuous Assessment Admission H&P  Date: 10/31/24 Patient Name: [name] MRN: [id number] Chief Complaint: I don't know what to do, I just need help  Diagnoses:  Final diagnoses:  Severe episode of recurrent major depressive disorder, without psychotic features (HCC)  Cocaine abuse (HCC)    HPI: Note from earlier today, written by Gaither Fallow, NP: Bethene Blush, 38 y/o female with a history of MDD and GAD.  Presented to [facility] voluntarily.  Per the patient she was trying to get help for cocaine usage.  According to her she used cocaine practically every day for the last 6 months when asked when was the last time she used cocaine she said she used today valid around $36.  Patient is currently unemployed lives with her boyfriend her and her 3 kids.  Patient stated she goes to [facility] for her depression and anxiety.  Patient currently not seeing a therapist.     Face-to-face evaluation of patient, patient is alert and oriented x 4, speech is clear, maintain eye contact.  Patient denies SI, HI, AVH or paranoia.  Reports that she used crack cocaine on a daily basis the last time was today.  Patient also reports she drank a beer today but she does not drink on a regular this the first time she drank a beer.  Patient does not seem to be influenced by internal or external stimuli does not appear to be in any immediate distress, denies access to gun denies wanting to hurt herself or others denies any prior history of suicide attempts or any psychiatric hospitalization.  Writer discussed with patient that we could admit her to the [facility] unit to help with rehabilitation.  Patient first agreed with the plan.     After about 10 to 15 minutes patient alerted nursing staff that she did not want to stay anymore she wanted to leave.  Patient was given the opportunity to sign out AMA, because she was already given a bed and afterwards refused.   Patient was advised to call 911 or  return to the nearest ED should she experience any suicidal thoughts homicidal ideation or hallucination.  Patient was also advised that she could return if she wanted to be admitted for rehabilitation  Patient is reevaluated face-to-face by this provider.  Ashly admits that she was here last night and decided to leave AMA I got mad...mean I was scared, I have kids at home, I wasn't really ready. She reports that when she went home, she kept experiencing hopelessness and worthlessness, thinking about cutting herself which she has done in the past. She  reports feeling depressed all the time, which leads to her substance use. She reports that her boyfriend encouraged her to seek help as her substance use is affecting the entire family and the family finances. She reports that she is supposed to be taking medications including Cymbalta , Trazodone  and Lurazidone  which are prescribed by Dr Shirline at [facility]. States she has not been there for many months. States she currently does not have any therapist.  Reports she decided to come back because she was starting to feel unsafe. States her boyfriend is supportive. Reports feeling guilty because her boyfriend does not use substances. She denies HI/AVH.   During this assessment, patient is tearful and states she is disappointed in herself. She appears anxious, restless. Reports not sleeping well. Reports spending a lot of money on crack/cocaine, thinking that  this is  going to help with  her depressive symptoms. She also reports using Marijuana and smoking nicotine , about 1 pack a day. Her speech is pressured. Thought process is coherent/goal-directed. Patient does not seem preoccupied or responding to internal stimuli. Reports endorsing symptoms including anhedonia, insomnia, hopelessness, worthlessness and thoughts of harming herself.    Patient desires to get back on medications to address the above symptoms and others. She is willing to be connected to a  therapist upon stabilization. Inpatient treatment process is discussed with her and she expresses motivation.       Total Time spent with patient: 45 minutes  Musculoskeletal  Strength & Muscle Tone: within normal limits Gait & Station: normal Patient leans: N/A  Psychiatric Specialty Exam  Presentation General Appearance:  Casual  Eye Contact: Good  Speech: Clear and Coherent  Speech Volume: Normal  Handedness: Right   Mood and Affect  Mood: Anxious  Affect: Appropriate   Thought Process  Thought Processes: Coherent  Descriptions of Associations:Circumstantial  Orientation:Full (Time, Place and Person)  Thought Content:Logical  Diagnosis of Schizophrenia or Schizoaffective disorder in past: No   Hallucinations:Hallucinations: None  Ideas of Reference:None  Suicidal Thoughts:Suicidal Thoughts: No  Homicidal Thoughts:Homicidal Thoughts: No   Sensorium  Memory: Immediate Fair  Judgment: Fair  Insight: Fair   Art Therapist  Concentration: Fair  Attention Span: Fair  Recall: Fair  Fund of Knowledge: Fair  Language: Fair   Psychomotor Activity  Psychomotor Activity: Psychomotor Activity: Normal   Assets  Assets: Desire for Improvement; Vocational/Educational   Sleep  Sleep: Sleep: Fair Number of Hours of Sleep: 8   Nutritional Assessment (For OBS and FBC admissions only) Has the patient had a weight loss or gain of 10 pounds or more in the last 3 months?: No Has the patient had a decrease in food intake/or appetite?: No Does the patient have dental problems?: No Does the patient have eating habits or behaviors that may be indicators of an eating disorder including binging or inducing vomiting?: No Has the patient recently lost weight without trying?: 0 Has the patient been eating poorly because of a decreased appetite?: 0 Malnutrition Screening Tool Score: 0    Physical Exam Vitals and nursing note  reviewed.  HENT:     Head: Normocephalic and atraumatic.     Right Ear: Tympanic membrane normal.     Left Ear: Tympanic membrane normal.     Nose: Nose normal.  Eyes:     Extraocular Movements: Extraocular movements intact.     Pupils: Pupils are equal, round, and reactive to light.  Cardiovascular:     Rate and Rhythm: Normal rate.     Pulses: Normal pulses.  Pulmonary:     Effort: Pulmonary effort is normal.  Musculoskeletal:        General: Normal range of motion.     Cervical back: Normal range of motion and neck supple.  Neurological:     General: No focal deficit present.     Mental Status: She is alert and oriented to person, place, and time.    Review of Systems  Constitutional: Negative.   HENT: Negative.    Eyes: Negative.   Respiratory: Negative.    Cardiovascular: Negative.   Gastrointestinal: Negative.   Genitourinary: Negative.   Musculoskeletal: Negative.   Skin: Negative.   Neurological: Negative.   Endo/Heme/Allergies: Negative.   Psychiatric/Behavioral:  Positive for depression, substance abuse and suicidal ideas. The patient is nervous/anxious.     Blood pressure (!) 140/102, pulse 95, temperature 98 F (36.7  C), temperature source Temporal, resp. rate 17, SpO2 99%. There is no height or weight on file to calculate BMI.  Past Psychiatric History: MDD, Substance abuse   Is the patient at risk to self? Yes  Has the patient been a risk to self in the past 6 months? Yes .    Has the patient been a risk to self within the distant past? Yes   Is the patient a risk to others? No   Has the patient been a risk to others in the past 6 months? No   Has the patient been a risk to others within the distant past? No   Past Medical History: NA  Family History: Both parents suffer from depression  Social History: Lives with boyfriend and 3 kids  Last Labs:  No visits with results within 6 Month(s) from this visit.  Latest known visit with results is:   Admission on 08/01/2023, Discharged on 08/01/2023  Component Date Value Ref Range Status   Sodium 08/01/2023 135  135 - 145 mmol/L Final   Potassium 08/01/2023 3.3 (L)  3.5 - 5.1 mmol/L Final   Chloride 08/01/2023 101  98 - 111 mmol/L Final   CO2 08/01/2023 22  22 - 32 mmol/L Final   Glucose, Bld 08/01/2023 119 (H)  70 - 99 mg/dL Final   Glucose reference range applies only to samples taken after fasting for at least 8 hours.   BUN 08/01/2023 7  6 - 20 mg/dL Final   Creatinine, Ser 08/01/2023 0.79  0.44 - 1.00 mg/dL Final   Calcium  08/01/2023 8.4 (L)  8.9 - 10.3 mg/dL Final   Total Protein 90/91/7975 6.9  6.5 - 8.1 g/dL Final   Albumin 90/91/7975 3.6  3.5 - 5.0 g/dL Final   AST 90/91/7975 16  15 - 41 U/L Final   ALT 08/01/2023 10  0 - 44 U/L Final   Alkaline Phosphatase 08/01/2023 54  38 - 126 U/L Final   Total Bilirubin 08/01/2023 0.5  0.3 - 1.2 mg/dL Final   GFR, Estimated 08/01/2023 >60  >60 mL/min Final   Comment: (NOTE) Calculated using the CKD-EPI Creatinine Equation (2021)    Anion gap 08/01/2023 12  5 - 15 Final   Performed at [facility], [address] [place], [place] [address]   Troponin I (High Sensitivity) 08/01/2023 2  <18 ng/L Final   Comment: (NOTE) Elevated high sensitivity troponin I (hsTnI) values and significant  changes across serial measurements may suggest ACS but many other  chronic and acute conditions are known to elevate hsTnI results.  Refer to the Links section for chest pain algorithms and additional  guidance. Performed at [facility], [address], [place], [place] [address]    WBC 08/01/2023 12.8 (H)  4.0 - 10.5 K/uL Final   RBC 08/01/2023 4.38  3.87 - 5.11 MIL/uL Final   Hemoglobin 08/01/2023 14.7  12.0 - 15.0 g/dL Final   HCT 90/91/7975 43.1  36.0 - 46.0 % Final   MCV 08/01/2023 98.4  80.0 - 100.0 fL Final   MCH 08/01/2023 33.6  26.0 - 34.0 pg Final   MCHC 08/01/2023 34.1  30.0 - 36.0 g/dL Final   RDW 90/91/7975 12.0  11.5 - 15.5 %  Final   Platelets 08/01/2023 169  150 - 400 K/uL Final   nRBC 08/01/2023 0.0  0.0 - 0.2 % Final   Neutrophils Relative % 08/01/2023 91  % Final   Neutro Abs 08/01/2023 11.6 (H)  1.7 - 7.7  K/uL Final   Lymphocytes Relative 08/01/2023 4  % Final   Lymphs Abs 08/01/2023 0.5 (L)  0.7 - 4.0 K/uL Final   Monocytes Relative 08/01/2023 5  % Final   Monocytes Absolute 08/01/2023 0.6  0.1 - 1.0 K/uL Final   Eosinophils Relative 08/01/2023 0  % Final   Eosinophils Absolute 08/01/2023 0.0  0.0 - 0.5 K/uL Final   Basophils Relative 08/01/2023 0  % Final   Basophils Absolute 08/01/2023 0.0  0.0 - 0.1 K/uL Final   Immature Granulocytes 08/01/2023 0  % Final   Abs Immature Granulocytes 08/01/2023 0.04  0.00 - 0.07 K/uL Final   Performed at [facility], [address], [place], [place] [address]   B Natriuretic Peptide 08/01/2023 43.0  0.0 - 100.0 pg/mL Final   Performed at [facility], [address] [place], [place] [address]   SARS Coronavirus 2 by RT PCR 08/01/2023 NEGATIVE  NEGATIVE Final   Comment: (NOTE) SARS-CoV-2 target nucleic acids are NOT DETECTED.  The SARS-CoV-2 RNA is generally detectable in upper and lower respiratory specimens during the acute phase of infection. The lowest concentration of SARS-CoV-2 viral copies this assay can detect is 250 copies / mL. A negative result does not preclude SARS-CoV-2 infection and should not be used as the sole basis for treatment or other patient management decisions.  A negative result may occur with improper specimen collection / handling, submission of specimen other than nasopharyngeal swab, presence of viral mutation(s) within the areas targeted by this assay, and inadequate number of viral copies (<250 copies / mL). A negative result must be combined with clinical observations, patient history, and epidemiological information.  Fact Sheet for Patients:   roadlaptop.co.za  Fact Sheet for Healthcare  Providers: http://kim-miller.com/  This test is not yet approved or                           cleared by the [place]  FDA and has been authorized for detection and/or diagnosis of SARS-CoV-2 by FDA under an Emergency Use Authorization (EUA).  This EUA will remain in effect (meaning this test can be used) for the duration of the COVID-19 declaration under Section 564(b)(1) of the Act, 21 U.S.C. section 360bbb-3(b)(1), unless the authorization is terminated or revoked sooner.  Performed at [facility], [address], [place], [place] [address]     Allergies: Iohexol , Sulfa antibiotics, Penicillins, and Penicillin g  Medications:  PTA Medications  Medication Sig   levonorgestrel  (MIRENA ) 20 MCG/DAY IUD 1 each by Intrauterine route once.   ferrous sulfate  325 (65 FE) MG tablet Take 650 mg by mouth daily with breakfast.   sucralfate  (CARAFATE ) 1 g tablet Take 1 tablet (1 g total) by mouth 2 (two) times daily. (Patient not taking: Reported on 03/22/2023)   albuterol  (VENTOLIN  HFA) 108 (90 Base) MCG/ACT inhaler Inhale 2 puffs into the lungs every 6 (six) hours as needed for wheezing or shortness of breath. (Patient not taking: Reported on 02/17/2023)   docusate sodium  (COLACE) 100 MG capsule Take 200 mg by mouth daily.   DULoxetine  (CYMBALTA ) 20 MG capsule Take 1 capsule (20 mg total) by mouth daily.   traZODone  (DESYREL ) 100 MG tablet Take 1 tablet (100 mg total) by mouth at bedtime.   gabapentin  (NEURONTIN ) 100 MG capsule TAKE 1 CAPSULE(100 MG) BY MOUTH THREE TIMES DAILY   nicotine  (NICODERM CQ  - DOSED IN MG/24 HOURS) 21 mg/24hr patch Place 1 patch (21 mg total)  onto the skin daily. Remove before sleep. (Patient not taking: Reported on 02/17/2023)   omeprazole  (PRILOSEC) 40 MG capsule Take 1 capsule (40 mg total) by mouth 2 (two) times daily.   hydrOXYzine  (ATARAX ) 25 MG tablet Take 25 mg by mouth 3 (three) times daily as needed for anxiety.   Lurasidone  HCl 60 MG  TABS Take 1 tablet by mouth at bedtime.   amphetamine-dextroamphetamine (ADDERALL XR) 5 MG 24 hr capsule Take 5 mg by mouth daily.   divalproex  (DEPAKOTE  ER) 500 MG 24 hr tablet Take 1,500 mg by mouth at bedtime.   benzonatate  (TESSALON ) 100 MG capsule Take 1 capsule (100 mg total) by mouth every 8 (eight) hours.   amoxicillin -clavulanate (AUGMENTIN ) 875-125 MG tablet Take 1 tablet by mouth every 12 (twelve) hours.   Vitamin D , Ergocalciferol , (DRISDOL ) 1.25 MG (50000 UNIT) CAPS capsule TAKE 1 CAPSULE BY MOUTH EVERY 7 DAYS   predniSONE  (STERAPRED UNI-PAK 21 TAB) 10 MG (21) TBPK tablet Use as directed.   clindamycin  (CLEOCIN ) 300 MG capsule Take 1 capsule (300 mg total) by mouth 2 (two) times daily.   chlorhexidine  (PERIDEX ) 0.12 % solution Use as directed 15 mLs in the mouth or throat 2 (two) times daily.   lidocaine  (XYLOCAINE ) 2 % solution Use as directed 10 mLs in the mouth or throat every 3 (three) hours as needed.      Medical Decision Making  Admission to Observation unit. Inpatient recommended to address medication needs and stabilization Acetaminophen  650 mg PO Q 6 PRN Maalox 30 ml PO Q 4 PRN Milk of Magnesia 30 ml PO Daily PRN Hydroxyzine  25 mg PO TID PRN Trazodone  50  mg PO HS PRN  Labs: CBC, CMP, TSH, A1C, UDS, UA, UPT, Ethanol, Magnesium , Hepatic Function Panel, Lipid panel    EKG  Recommendations  Based on my evaluation the patient does not appear to have an emergency medical condition.  Randall Bouquet, NP 10/31/24  12:06 PM

## 2024-10-31 NOTE — Progress Notes (Signed)
Safe transport called at this time. 

## 2024-10-31 NOTE — Group Note (Signed)
 Date:  10/31/2024 Time:  9:55 PM  Group Topic/Focus:  Wrap-Up Group:   The focus of this group is to help patients review their daily goal of treatment and discuss progress on daily workbooks.    Participation Level:  Did Not Attend  Participation Quality:  none  Affect:  none  Cognitive:  none  Insight: None  Engagement in Group:  None  Modes of Intervention:  none  Additional Comments:   Pt did not attend wrap up group  Ranard Harte A Jeani Fassnacht 10/31/2024, 9:55 PM

## 2024-10-31 NOTE — ED Notes (Addendum)
 Pt is awake and alert.  Flat affect and depressed mood. Reports recent drug use.  With  thoughts of suicide.    Pt searched with no contraband found.  She was brought onto the unit and given lunch meal.   Pt is currently resting in no distress.   Staff will cont to monitor for safety.

## 2024-10-31 NOTE — ED Provider Notes (Cosign Needed)
 [facility] Continuous Assessment Admission H&P  Date: 10/31/24 Patient Name: [name] MRN: [id number] Chief Complaint: cocaine use  Diagnoses:  Final diagnoses:  Cocaine abuse (HCC)  Recurrent major depressive disorder, remission status unspecified    HPI: [name], 38 y/o female with a history of MDD and GAD.  Presented to [facility] voluntarily.  Per the patient she was trying to get help for cocaine usage.  According to her she used cocaine practically every day for the last 6 months when asked when was the last time she used cocaine she said she used today valid around $25.  Patient is currently unemployed lives with her boyfriend her and her 3 kids.  Patient stated she goes to [facility] for her depression and anxiety.  Patient currently not seeing a therapist.    Face-to-face evaluation of patient, patient is alert and oriented x 4, speech is clear, maintain eye contact.  Patient denies SI, HI, AVH or paranoia.  Reports that she used crack cocaine on a daily basis the last time was today.  Patient also reports she drank a beer today but she does not drink on a regular this the first time she drank a beer.  Patient does not seem to be influenced by internal or external stimuli does not appear to be in any immediate distress, denies access to gun denies wanting to hurt herself or others denies any prior history of suicide attempts or any psychiatric hospitalization.  Writer discussed with patient that we could admit her to the [facility] unit to help with rehabilitation.  Patient first agreed with the plan.   After about 10 to 15 minutes patient alerted nursing staff that she did not want to stay anymore she wanted to leave.  Patient was given the opportunity to sign out AMA, because she was already given a bed and afterwards refused.   Patient was advised to call 911 or return to the nearest ED should she experience any suicidal thoughts homicidal ideation or hallucination.  Patient  was also advised that she could return if she wanted to be admitted for rehabilitation.    Total Time spent with patient: 30 minutes  Musculoskeletal  Strength & Muscle Tone: within normal limits Gait & Station: normal Patient leans: N/A  Psychiatric Specialty Exam  Presentation General Appearance:  Casual  Eye Contact: Good  Speech: Clear and Coherent  Speech Volume: Normal  Handedness: Right   Mood and Affect  Mood: Anxious  Affect: Appropriate   Thought Process  Thought Processes: Coherent  Descriptions of Associations:Circumstantial  Orientation:Full (Time, Place and Person)  Thought Content:Logical    Hallucinations:Hallucinations: None  Ideas of Reference:None  Suicidal Thoughts:Suicidal Thoughts: No  Homicidal Thoughts:Homicidal Thoughts: No   Sensorium  Memory: Immediate Fair  Judgment: Fair  Insight: Fair   Art Therapist  Concentration: Fair  Attention Span: Fair  Recall: Fair  Fund of Knowledge: Fair  Language: Fair   Psychomotor Activity  Psychomotor Activity: Psychomotor Activity: Normal   Assets  Assets: Desire for Improvement; Vocational/Educational   Sleep  Sleep: Sleep: Fair Number of Hours of Sleep: 8   Nutritional Assessment (For OBS and FBC admissions only) Has the patient had a weight loss or gain of 10 pounds or more in the last 3 months?: No Has the patient had a decrease in food intake/or appetite?: No Does the patient have dental problems?: No Does the patient have eating habits or behaviors that may be indicators of an eating disorder including binging  or inducing vomiting?: No Has the patient recently lost weight without trying?: 0 Has the patient been eating poorly because of a decreased appetite?: 0 Malnutrition Screening Tool Score: 0    Physical Exam HENT:     Head: Normocephalic.     Nose: Nose normal.  Eyes:     Pupils: Pupils are equal, round, and reactive to  light.  Cardiovascular:     Rate and Rhythm: Normal rate.  Pulmonary:     Effort: Pulmonary effort is normal.  Musculoskeletal:        General: Normal range of motion.     Cervical back: Normal range of motion.  Neurological:     General: No focal deficit present.     Mental Status: She is alert.  Psychiatric:        Mood and Affect: Mood normal.    Review of Systems  Constitutional: Negative.   HENT: Negative.    Eyes: Negative.   Respiratory: Negative.    Cardiovascular: Negative.   Gastrointestinal: Negative.   Genitourinary: Negative.   Musculoskeletal: Negative.   Skin: Negative.   Neurological: Negative.   Psychiatric/Behavioral:  Positive for substance abuse. The patient is nervous/anxious.     Blood pressure (!) 137/90, pulse 88, temperature 98.2 F (36.8 C), temperature source Oral, resp. rate 20, SpO2 97%. There is no height or weight on file to calculate BMI.  Past Psychiatric History: Cocaine abuse, MDD, GAD  Is the patient at risk to self? No  Has the patient been a risk to self in the past 6 months? No .    Has the patient been a risk to self within the distant past? No   Is the patient a risk to others? No   Has the patient been a risk to others in the past 6 months? No   Has the patient been a risk to others within the distant past? No   Past Medical History: See chart  Family History: Unknown  Social History: Cocaine, alcohol  Last Labs:  No visits with results within 6 Month(s) from this visit.  Latest known visit with results is:  Admission on 08/01/2023, Discharged on 08/01/2023  Component Date Value Ref Range Status   Sodium 08/01/2023 135  135 - 145 mmol/L Final   Potassium 08/01/2023 3.3 (L)  3.5 - 5.1 mmol/L Final   Chloride 08/01/2023 101  98 - 111 mmol/L Final   CO2 08/01/2023 22  22 - 32 mmol/L Final   Glucose, Bld 08/01/2023 119 (H)  70 - 99 mg/dL Final   Glucose reference range applies only to samples taken after fasting for at  least 8 hours.   BUN 08/01/2023 7  6 - 20 mg/dL Final   Creatinine, Ser 08/01/2023 0.79  0.44 - 1.00 mg/dL Final   Calcium  08/01/2023 8.4 (L)  8.9 - 10.3 mg/dL Final   Total Protein 90/91/7975 6.9  6.5 - 8.1 g/dL Final   Albumin 90/91/7975 3.6  3.5 - 5.0 g/dL Final   AST 90/91/7975 16  15 - 41 U/L Final   ALT 08/01/2023 10  0 - 44 U/L Final   Alkaline Phosphatase 08/01/2023 54  38 - 126 U/L Final   Total Bilirubin 08/01/2023 0.5  0.3 - 1.2 mg/dL Final   GFR, Estimated 08/01/2023 >60  >60 mL/min Final   Comment: (NOTE) Calculated using the CKD-EPI Creatinine Equation (2021)    Anion gap 08/01/2023 12  5 - 15 Final   Performed at [facility], [address] [place], [place] [address]   Troponin I (High Sensitivity) 08/01/2023 2  <18 ng/L Final   Comment: (NOTE) Elevated high sensitivity troponin I (hsTnI) values and significant  changes across serial measurements may suggest ACS but many other  chronic and acute conditions are known to elevate hsTnI results.  Refer to the Links section for chest pain algorithms and additional  guidance. Performed at [facility], [address] [place], [place] [address]    WBC 08/01/2023 12.8 (H)  4.0 - 10.5 K/uL Final   RBC 08/01/2023 4.38  3.87 - 5.11 MIL/uL Final   Hemoglobin 08/01/2023 14.7  12.0 - 15.0 g/dL Final   HCT 90/91/7975 43.1  36.0 - 46.0 % Final   MCV 08/01/2023 98.4  80.0 - 100.0 fL Final   MCH 08/01/2023 33.6  26.0 - 34.0 pg Final   MCHC 08/01/2023 34.1  30.0 - 36.0 g/dL Final   RDW 90/91/7975 12.0  11.5 - 15.5 % Final   Platelets 08/01/2023 169  150 - 400 K/uL Final   nRBC 08/01/2023 0.0  0.0 - 0.2 % Final   Neutrophils Relative % 08/01/2023 91  % Final   Neutro Abs 08/01/2023 11.6 (H)  1.7 - 7.7 K/uL Final   Lymphocytes Relative 08/01/2023 4  % Final   Lymphs Abs 08/01/2023 0.5 (L)  0.7 - 4.0 K/uL Final   Monocytes Relative 08/01/2023 5  % Final   Monocytes Absolute 08/01/2023 0.6  0.1 - 1.0 K/uL Final   Eosinophils  Relative 08/01/2023 0  % Final   Eosinophils Absolute 08/01/2023 0.0  0.0 - 0.5 K/uL Final   Basophils Relative 08/01/2023 0  % Final   Basophils Absolute 08/01/2023 0.0  0.0 - 0.1 K/uL Final   Immature Granulocytes 08/01/2023 0  % Final   Abs Immature Granulocytes 08/01/2023 0.04  0.00 - 0.07 K/uL Final   Performed at [facility], [address], [place], [place] [address]   B Natriuretic Peptide 08/01/2023 43.0  0.0 - 100.0 pg/mL Final   Performed at [facility], [address], [place], [place] [address]   SARS Coronavirus 2 by RT PCR 08/01/2023 NEGATIVE  NEGATIVE Final   Comment: (NOTE) SARS-CoV-2 target nucleic acids are NOT DETECTED.  The SARS-CoV-2 RNA is generally detectable in upper and lower respiratory specimens during the acute phase of infection. The lowest concentration of SARS-CoV-2 viral copies this assay can detect is 250 copies / mL. A negative result does not preclude SARS-CoV-2 infection and should not be used as the sole basis for treatment or other patient management decisions.  A negative result may occur with improper specimen collection / handling, submission of specimen other than nasopharyngeal swab, presence of viral mutation(s) within the areas targeted by this assay, and inadequate number of viral copies (<250 copies / mL). A negative result must be combined with clinical observations, patient history, and epidemiological information.  Fact Sheet for Patients:   roadlaptop.co.za  Fact Sheet for Healthcare Providers: http://kim-miller.com/  This test is not yet approved or                           cleared by the [place]  FDA and has been authorized for detection and/or diagnosis of SARS-CoV-2 by FDA under an Emergency Use Authorization (EUA).  This EUA will remain in effect (meaning this test can be used) for the duration of the COVID-19 declaration under Section 564(b)(1) of the Act, 21 U.S.C. section  360bbb-3(b)(1),  unless the authorization is terminated or revoked sooner.  Performed at [facility], [address] [place], [place] [address]     Allergies: Iohexol , Sulfa antibiotics, Penicillins, and Penicillin g  Medications:  PTA Medications  Medication Sig   levonorgestrel  (MIRENA ) 20 MCG/DAY IUD 1 each by Intrauterine route once.   ferrous sulfate  325 (65 FE) MG tablet Take 650 mg by mouth daily with breakfast.   sucralfate  (CARAFATE ) 1 g tablet Take 1 tablet (1 g total) by mouth 2 (two) times daily. (Patient not taking: Reported on 03/22/2023)   albuterol  (VENTOLIN  HFA) 108 (90 Base) MCG/ACT inhaler Inhale 2 puffs into the lungs every 6 (six) hours as needed for wheezing or shortness of breath. (Patient not taking: Reported on 02/17/2023)   docusate sodium  (COLACE) 100 MG capsule Take 200 mg by mouth daily.   DULoxetine  (CYMBALTA ) 20 MG capsule Take 1 capsule (20 mg total) by mouth daily.   gabapentin  (NEURONTIN ) 100 MG capsule TAKE 1 CAPSULE(100 MG) BY MOUTH THREE TIMES DAILY   nicotine  (NICODERM CQ  - DOSED IN MG/24 HOURS) 21 mg/24hr patch Place 1 patch (21 mg total) onto the skin daily. Remove before sleep. (Patient not taking: Reported on 02/17/2023)   omeprazole  (PRILOSEC) 40 MG capsule Take 1 capsule (40 mg total) by mouth 2 (two) times daily.   hydrOXYzine  (ATARAX ) 25 MG tablet Take 25 mg by mouth 3 (three) times daily as needed for anxiety.   Lurasidone  HCl 60 MG TABS Take 1 tablet by mouth at bedtime.   amphetamine-dextroamphetamine (ADDERALL XR) 5 MG 24 hr capsule Take 5 mg by mouth daily.   divalproex  (DEPAKOTE  ER) 500 MG 24 hr tablet Take 1,500 mg by mouth at bedtime.   benzonatate  (TESSALON ) 100 MG capsule Take 1 capsule (100 mg total) by mouth every 8 (eight) hours.   amoxicillin -clavulanate (AUGMENTIN ) 875-125 MG tablet Take 1 tablet by mouth every 12 (twelve) hours.   Vitamin D , Ergocalciferol , (DRISDOL ) 1.25 MG (50000 UNIT) CAPS capsule TAKE 1 CAPSULE BY MOUTH EVERY 7  DAYS   predniSONE  (STERAPRED UNI-PAK 21 TAB) 10 MG (21) TBPK tablet Use as directed.   clindamycin  (CLEOCIN ) 300 MG capsule Take 1 capsule (300 mg total) by mouth 2 (two) times daily.   chlorhexidine  (PERIDEX ) 0.12 % solution Use as directed 15 mLs in the mouth or throat 2 (two) times daily.   lidocaine  (XYLOCAINE ) 2 % solution Use as directed 10 mLs in the mouth or throat every 3 (three) hours as needed.      Medical Decision Making  Recommend FBC units however patient after being admitted changed her mind and stated he wanted to leave.    Recommendations  Based on my evaluation the patient does not appear to have an emergency medical condition.  Gaither Pouch, NP 10/31/24  12:55 AM

## 2024-10-31 NOTE — Progress Notes (Signed)
 Pt has been accepted to [facility] on 10/31/2024 Bed assignment: 302-02  Pt meets inpatient criteria ezm:[id number] [name],   Attending Physician will be: Dr. Prentis    Report can be called to: Adult unit: [id number]  Pt can arrive after  pending labs, vol, UDS, EKG.   Care Team Notified: [facility] [name]    [name]   10/31/2024 1:35 PM

## 2024-10-31 NOTE — Progress Notes (Signed)
 [name] is a 38 y.o. female voluntarily admitted for depression. Pt also is here asking for treatment for substance use. Her drug of choice is cocaine, spends $80 daily on cocaine. Pt states she wants to get better for her family. Pt has been tearful whole this admission process stating she is missing her children. Pt has been alert and oriented, denies SI/HI, AVH and contracted for safety. Consents signed, skin/belongings search completed and pt oriented to unit. Pt stable at this time. Pt given the opportunity to express concerns and ask questions. Pt given toiletries. Will continue to monitor.

## 2024-10-31 NOTE — BH Assessment (Addendum)
 Comprehensive Clinical Assessment (CCA) Note  10/31/2024 [name] [id number]  Chief Complaint:  10/31/2024 [name] [id number]  Disposition: Per Starlyn Patron, NP inpatient treatment is recommended.  [facility] to review.  Disposition SW to pursue appropriate inpatient options.  The patient demonstrates the following risk factors for suicide: Chronic risk factors for suicide include: psychiatric disorder of MDD, substance use disorder, and previous suicide attempts x several, most recent by cutting 15 yrs ago . Acute risk factors for suicide include: family or marital conflict, social withdrawal/isolation, and loss (financial, interpersonal, professional). Protective factors for this patient include: positive social support, responsibility to others (children, family), and hope for the future. Considering these factors, the overall suicide risk at this point appears to be low. Patient is appropriate for outpatient follow up, once stabilized.   Patient is a 38 year old female with a history of Major Depressive Disorder, recurrent, moderate, currently untreated and Stimulant Use Disorder, cocaine type, moderate who presents voluntarily to [facility] for assessment.  When patient presented last night, she stated she was seeking substance use treatment.  Shortly after the provider saw her, patient requested to be discharged, prior to [facility] admission.  Today, she returns seeking inpatient psychiatric treatment, reporting worsening depression and passive SI.  Patient resides with her boyfriend and 3 children. Patient has reported having self-esteem issues which she feels contributes to worsening depression and increased cocaine use. She reports recent daily cocaine use and she last used $80 worth of cocaine yesterday. Patient also admitted to drinking 40 oz of beer yesterday. Patient reports worsening depressive symptoms to include: isolation, crying spells, irritability,  hopelessness, guilt, fatigue, poor concentration, worthlessness, poor sleep and poor appetite. Patient reports history of past suicide attempts and most recent attempt was 15 years ago by cutting.  She has been followed by [facility] for med management, however per Starlyn, NP, it appears she has not had prescriptions refilled for several months.  Patient denies HI and AVH.   Chief Complaint:  Chief Complaint Patient presents with  Cocaine Addiction  Visit Diagnosis: Major Depressive Disorder, recurrent, moderate w/o psychotic fx                             Cocaine Use Disorder, moderate   CCA Screening, Triage and Referral (STR)  Patient Reported Information How did you hear about us ? Self  What Is the Reason for Your Visit/Call Today? [name] 38y female presents to [facility] unaccompanied. PT states she is here to detox from cocaine. PT states that she uses cocaine everyday. PT denies SI, HI, AVH.  How Long Has This Been Causing You Problems? > than 6 months  What Do You Feel Would Help You the Most Today? Alcohol or Drug Use Treatment; Social Support   Have You Recently Had Any Thoughts About Hurting Yourself? No  Are You Planning to Commit Suicide/Harm Yourself At This time? No   Flowsheet Row ED from 10/31/2024 in [facility] Most recent reading at 10/31/2024 11:19 AM ED from 10/31/2024 in [facility] Most recent reading at 10/31/2024 12:03 AM UC from 10/08/2024 in [facility] [place] Most recent reading at 10/08/2024  9:33 AM  C-SSRS RISK CATEGORY Moderate Risk No Risk No Risk    Have you Recently Had Thoughts About Hurting Someone Sherral? No  Are You Planning to Harm Someone at This Time? No  Explanation:  N/A   Have You Used Any Alcohol or Drugs in the Past 24 Hours? Yes  How Long Ago Did You Use Drugs or Alcohol? yesterday What Did You Use and How Much? 40 oz. beer, cocaine   Do You  Currently Have a Therapist/Psychiatrist? Yes  Name of Therapist/Psychiatrist: Name of Therapist/Psychiatrist: [facility], med management   Have You Been Recently Discharged From Any Office Practice or Programs? No  Explanation of Discharge From Practice/Program: N/A    CCA Screening Triage Referral Assessment Type of Contact: Face-to-Face  Telemedicine Service Delivery:   Is this Initial or Reassessment?   Date Telepsych consult ordered in [facility]:    Time Telepsych consult ordered in [facility]:    Location of Assessment: [facility] Assessment Services  Provider Location: [facility] Assessment Services   Collateral Involvement: None   Does Patient Have a Automotive Engineer Guardian? No  Legal Guardian Contact Information: N/A  Copy of Legal Guardianship Form: -- (N/A)  Legal Guardian Notified of Arrival: -- (N/A)  Legal Guardian Notified of Pending Discharge: -- (N/A)  If Minor and Not Living with Parent(s), Who has Custody? N/A  Is CPS involved or ever been involved? Never  Is APS involved or ever been involved? Never   Patient Determined To Be At Risk for Harm To Self or Others Based on Review of Patient Reported Information or Presenting Complaint? No  Method: -- (N/A, no HI)  Availability of Means: -- (N/A, no HI)  Intent: -- (N/A, no HI)  Notification Required: -- (N/A, no HI)  Additional Information for Danger to Others Potential: -- (N/A, no HI)  Additional Comments for Danger to Others Potential: N/A, no HI  Are There Guns or Other Weapons in Your Home? No  Types of Guns/Weapons: n/a  Are These Weapons Safely Secured?                            -- (n/a)  Who Could Verify You Are Able To Have These Secured: n/a  Do You Have any Outstanding Charges, Pending Court Dates, Parole/Probation? Patient denies  Contacted To Inform of Risk of Harm To Self or Others: Family/Significant Other:    Does Patient Present under Involuntary Commitment? No    [place] of  Residence: [place]   Patient Currently Receiving the Following Services: Medication Management   Determination of Need: Urgent (48 hours)   Options For Referral: Facility-Based Crisis; Outpatient Therapy     CCA Biopsychosocial Patient Reported Schizophrenia/Schizoaffective Diagnosis in Past: No   Strengths: Seeking Treatment   Mental Health Symptoms Depression:  Hopelessness; Difficulty Concentrating; Sleep (too much or little); Tearfulness; Worthlessness   Duration of Depressive symptoms: Duration of Depressive Symptoms: Greater than two weeks   Mania:  None   Anxiety:   Worrying; Tension; Irritability; Difficulty concentrating   Psychosis:  None   Duration of Psychotic symptoms:    Trauma:  None   Obsessions:  None   Compulsions:  None   Inattention:  N/A   Hyperactivity/Impulsivity:  N/A   Oppositional/Defiant Behaviors:  N/A   Emotional Irregularity:  N/A   Other Mood/Personality Symptoms:  n/a    Mental Status Exam Appearance and self-care  Stature:  Average   Weight:  Average weight   Clothing:  Casual   Grooming:  Normal   Cosmetic use:  None   Posture/gait:  Normal   Motor activity:  Not Remarkable   Sensorium  Attention:  Normal  Concentration:  Normal   Orientation:  X5   Recall/memory:  Normal   Affect and Mood  Affect:  Depressed; Congruent   Mood:  Depressed   Relating  Eye contact:  Normal   Facial expression:  Responsive   Attitude toward examiner:  Cooperative   Thought and Language  Speech flow: Normal   Thought content:  Appropriate to Mood and Circumstances   Preoccupation:  None   Hallucinations:  None   Organization:  Coherent   [employer] of Knowledge:  Average   Intelligence:  Average   Abstraction:  Normal   Judgement:  Fair   Dance Movement Psychotherapist:  Adequate   Insight:  Fair   Decision Making:  Impulsive; Vacilates   Social Functioning  Social Maturity:  Isolates    Social Judgement:  Normal   Stress  Stressors:  Other (Comment) (substance abuse)   Coping Ability:  Overwhelmed   Skill Deficits:  Decision making   Supports:  Family; Friends/Service system     Religion: Religion/Spirituality Are You A Religious Person?: No How Might This Affect Treatment?: n/a  Leisure/Recreation: Leisure / Recreation Do You Have Hobbies?: No  Exercise/Diet: Exercise/Diet Do You Exercise?: No Have You Gained or Lost A Significant Amount of Weight in the Past Six Months?: No Do You Follow a Special Diet?: No Do You Have Any Trouble Sleeping?: Yes Explanation of Sleeping Difficulties: Poor sleep   CCA Employment/Education Employment/Work Situation:    Education:     CCA Family/Childhood History Family and Relationship History: Family history Marital status:  (in relationship) Does patient have children?: Yes How many children?: 3 How is patient's relationship with their children?: n/a  Childhood History:  Childhood History By whom was/is the patient raised?: Mother Did patient suffer any verbal/emotional/physical/sexual abuse as a child?: No Did patient suffer from severe childhood neglect?: No Has patient ever been sexually abused/assaulted/raped as an adolescent or adult?: No Was the patient ever a victim of a crime or a disaster?: No Witnessed domestic violence?: No Has patient been affected by domestic violence as an adult?: No       CCA Substance Use Alcohol/Drug Use: Alcohol / Drug Use Pain Medications: See MAR Prescriptions: See MAR Over the Counter: See MAR History of alcohol / drug use?: Yes Longest period of sobriety (when/how long): n/a Negative Consequences of Use: Financial, Personal relationships Withdrawal Symptoms: None Substance #1 Name of Substance 1: Cocaine 1 - Age of First Use: 20s 1 - Amount (size/oz): varies 1 - Frequency: daily 1 - Duration: recently 1 - Last Use / Amount: yesterday - $80  worth 1 - Method of Aquiring: buys 1- Route of Use: NA                       ASAM's:  Six Dimensions of Multidimensional Assessment  Dimension 1:  Acute Intoxication and/or Withdrawal Potential:   Dimension 1:  Description of individual's past and current experiences of substance use and withdrawal: Ongoing cocaine use  Dimension 2:  Biomedical Conditions and Complications:   Dimension 2:  Description of patient's biomedical conditions and  complications: none reported  Dimension 3:  Emotional, Behavioral, or Cognitive Conditions and Complications:  Dimension 3:  Description of emotional, behavioral, or cognitive conditions and complications: Underlying depression  Dimension 4:  Readiness to Change:  Dimension 4:  Description of Readiness to Change criteria: Seeking Treatment  Dimension 5:  Relapse, Continued use, or Continued Problem Potential:  Dimension 5:  Relapse, continued use, or continued problem potential critiera description: ongoing use hx, minimal focus on treatment  Dimension 6:  Recovery/Living Environment:  Dimension 6:  Recovery/Iiving environment criteria description: Lives with boyfriend and children  ASAM Severity Score: ASAM's Severity Rating Score: 6  ASAM Recommended Level of Treatment: ASAM Recommended Level of Treatment: Level II Intensive Outpatient Treatment   Substance use Disorder (SUD) Substance Use Disorder (SUD)  Checklist Symptoms of Substance Use: Persistent desire or unsuccessful efforts to cut down or control use, Social, occupational, recreational activities given up or reduced due to use  Recommendations for Services/Supports/Treatments: Recommendations for Services/Supports/Treatments Recommendations For Services/Supports/Treatments: Facility Based Crisis  Disposition Recommendation per psychiatric provider: We recommend inpatient psychiatric hospitalization when medically cleared. Patient is under voluntary admission status at this time;  please IVC if attempts to leave hospital.   DSM5 Diagnoses: Patient Active Problem List   Diagnosis Date Noted   Hoarseness 01/25/2024   Foot pain, left 12/11/2022   Insomnia 10/01/2022   Cough with hemoptysis 08/28/2022   High risk medication use: valproic acid  08/17/2022   History of traumatic brain injury 08/05/2022   Severe episode of recurrent major depressive disorder, without psychotic features (HCC) 08/05/2022   Generalized anxiety disorder with panic attacks 08/05/2022   Other chronic pain 08/05/2022   Peptic ulcer disease 07/30/2022   Recurrent pneumonia 07/30/2022   Obesity (BMI 35.0-39.9 without comorbidity) 07/30/2022   Vaginal discharge 06/16/2022   Burning with urination 06/16/2022   Major depressive disorder 07/16/2020   Encounter for gynecological examination with Papanicolaou smear of cervix 07/16/2020   IUD (intrauterine device) in place 07/16/2020   History of abnormal cervical Pap smear 07/16/2020   Ulceration of vulva 04/09/2020   Visit for insertion of intrauterine device 07/30/2016   ASCUS with positive high risk HPV cervical 12/14/2015   Tobacco use disorder 12/10/2015   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     Referrals to Alternative Service(s): Referred to Alternative Service(s):   Place:   Date:   Time:    Referred to Alternative Service(s):   Place:   Date:   Time:    Referred to Alternative Service(s):   Place:   Date:   Time:    Referred to Alternative Service(s):   Place:   Date:   Time:     [name], [facility]

## 2024-10-31 NOTE — Progress Notes (Signed)
   10/30/24 2342  [facility] Triage Screening (Walk-ins at [facility] only)  How Did You Hear About Us ? Self  What Is the Reason for Your Visit/Call Today? Pt presents to [facility] as a voluntary walk-in, unaccompanied requesting substance abuse treatment. Pt reports using cocaine today. Pt does not know amount used. Pt also reports drinking 40oz beer. Pt reports using cocaine on a daily basis for about 6 months. Pt reports history of anxiety and depression. Pt is taking prescribed medications, but unable to remember what she is prescribed. Pt denies being established with therapist at this time. Pt currently denies SI,HI,AVH.  How Long Has This Been Causing You Problems? 1-6 months  Have You Recently Had Any Thoughts About Hurting Yourself? No  Are You Planning to Commit Suicide/Harm Yourself At This time? No  Have you Recently Had Thoughts About Hurting Someone Sherral? No  Are You Planning To Harm Someone At This Time? No  Physical Abuse Denies  Verbal Abuse Denies  Sexual Abuse Denies  Exploitation of patient/patient's resources Denies  Self-Neglect Denies  Are you currently experiencing any auditory, visual or other hallucinations? No  Have You Used Any Alcohol or Drugs in the Past 24 Hours? Yes  What Did You Use and How Much? cocaine and 40oz beer  Do you have any current medical co-morbidities that require immediate attention? No  Clinician description of patient physical appearance/behavior: cooperative, calm, tearful at times  What Do You Feel Would Help You the Most Today? Alcohol or Drug Use Treatment  If access to [facility] was not available, would you have sought care in the Emergency Department? No  Determination of Need Routine (7 days)  Options For Referral Other: Comment;Chemical Dependency Intensive Outpatient Therapy (CDIOP);Medication Management

## 2024-10-31 NOTE — Progress Notes (Signed)
 Patient arrived at shift change. Skin searched and vitals taken. Skin search was unremarkable. Patient has various tattoos. Blood pressure elevated. Belongings search, consents, and admission assessment will be completed by night shift.

## 2024-10-31 NOTE — BH Assessment (Signed)
 Comprehensive Clinical Assessment (CCA) Note  10/31/2024 [name] [id number]  Chief Complaint:  Chief Complaint  Patient presents with   Drug Problem  Disposition: Per Gaither [name] patient is recommended for admission to [facility].  Patient ultimately decided to leave AMA.  The patient demonstrates the following risk factors for suicide: Chronic risk factors for suicide include: psychiatric disorder of MDD,GAD. Acute risk factors for suicide include: N/A. Protective factors for this patient include: hope for the future. Considering these factors, the overall suicide risk at this point appears to be low. Patient is appropriate for outpatient follow up.   [name] is a 38 year old female with a history of  MDD, GAD who presents voluntarily to [facility] for an assessment. Patient is seeking substance use treatment.Patient resides in the home with her boyfriend and 3 children. Patient reports having self-esteem issues which contributes to increased cocaine use. She reports her last use was $80 worth of cocaine today. She reports daily use. She reports drinking a 40oz beer today. She denies any other substance use. Patient reports isolation, crying spells, irritability, hopelessness, guilt, loss of interest to do things they enjoy, fatigue, lack of concentration, worthlessness, change in sleep, and  change in appetite. Patient reports history of past suicide attempts, last occurrence was 15 years ago by cutting.  Patient denies NSSIB, SI, HI, and AVH.  Patient denies history of abuse or trauma. Patient denies current legal problems. Patient is receiving outpatient  psychiatry services, with [facility]. Patient is not receiving outpatient therapy. Patient reports previous inpatient admission at [facility] for mental health. Patient denies access to weapons.   During evaluation patient is in no acute distress. She is alert, oriented x 4, calm, cooperative and attentive. Her mood  is depressed with congruent affect. She has normal speech, and behavior.  Objectively there is no evidence of psychosis/mania or delusional thinking.  Patient is able to converse coherently, goal directed thoughts, no distractibility, or pre-occupation. Patient answered question appropriately.      Visit Diagnosis:   Cocaine abuse (HCC) Recurrent major depressive disorder, remission status unspecified     CCA Screening, Triage and Referral (STR)  Patient Reported Information How did you hear about us ? Self  What Is the Reason for Your Visit/Call Today? Per triage note Pt presents to [facility] as a voluntary walk-in, unaccompanied requesting substance abuse treatment. Pt reports using cocaine today. Pt does not know amount used. Pt also reports drinking 40oz beer. Pt reports using cocaine on a daily basis for about 6 months. Pt reports history of anxiety and depression. Pt is taking prescribed medications, but unable to remember what she is prescribed. Pt denies being established with therapist at this time. Pt currently denies SI,HI,AVH.  How Long Has This Been Causing You Problems? 1-6 months  What Do You Feel Would Help You the Most Today? Alcohol or Drug Use Treatment   Have You Recently Had Any Thoughts About Hurting Yourself? No  Are You Planning to Commit Suicide/Harm Yourself At This time? No   Flowsheet Row ED from 10/31/2024 in [facility] from 10/08/2024 in [facility] at [facility] ED from 08/01/2023 in [facility] Emergency Department at [facility]  C-SSRS RISK CATEGORY No Risk No Risk No Risk    Have you Recently Had Thoughts About Hurting Someone Sherral? No  Are You Planning to Harm Someone at This Time? No  Explanation: pt denies HI   Have You  Used Any Alcohol or Drugs in the Past 24 Hours? Yes  How Long Ago Did You Use Drugs or Alcohol? today What Did You Use and How Much? cocaine, 40oz beer   Do You Currently  Have a Therapist/Psychiatrist? Yes  Name of Therapist/Psychiatrist: Name of Therapist/Psychiatrist: [facility]- medication management   Have You Been Recently Discharged From Any Office Practice or Programs? No  Explanation of Discharge From Practice/Program: n/a    CCA Screening Triage Referral Assessment Type of Contact: Face-to-Face  Telemedicine Service Delivery:   Is this Initial or Reassessment?   Date Telepsych consult ordered in [facility]:    Time Telepsych consult ordered in [facility]:    Location of Assessment: [facility]  Provider Location: [facility]   Collateral Involvement: n/a   Does Patient Have a Automotive Engineer Guardian? No  Legal Guardian Contact Information: n/a  Copy of Legal Guardianship Form: -- (n/a)  Legal Guardian Notified of Arrival: -- (n/a)  Legal Guardian Notified of Pending Discharge: -- (n/a)  If Minor and Not Living with Parent(s), Who has Custody? n/a  Is CPS involved or ever been involved? Never  Is APS involved or ever been involved? Never   Patient Determined To Be At Risk for Harm To Self or Others Based on Review of Patient Reported Information or Presenting Complaint? No  Method: No Plan  Availability of Means: No access or NA  Intent: Vague intent or NA  Notification Required: No need or identified person  Additional Information for Danger to Others Potential: -- (n/a)  Additional Comments for Danger to Others Potential: n/a  Are There Guns or Other Weapons in Your Home? No  Types of Guns/Weapons: n/a  Are These Weapons Safely Secured?                            -- (n/a)  Who Could Verify You Are Able To Have These Secured: n/a  Do You Have any Outstanding Charges, Pending Court Dates, Parole/Probation? Pt denies  Contacted To Inform of Risk of Harm To Self or Others: Other: Comment (n/a)    Does Patient Present under Involuntary Commitment? No    [place] of Residence:  [place]   Patient Currently Receiving the Following Services: Medication Management   Determination of Need: Urgent (48 hours)   Options For Referral: Facility-Based Crisis     CCA Biopsychosocial Patient Reported Schizophrenia/Schizoaffective Diagnosis in Past: No   Strengths: Seeking Treatment   Mental Health Symptoms Depression:  Change in energy/activity; Fatigue; Hopelessness; Increase/decrease in appetite; Difficulty Concentrating; Irritability; Sleep (too much or little); Tearfulness; Worthlessness   Duration of Depressive symptoms: Duration of Depressive Symptoms: Greater than two weeks   Mania:  N/A   Anxiety:   Worrying; Tension; Irritability; Difficulty concentrating   Psychosis:  None   Duration of Psychotic symptoms:    Trauma:  N/A   Obsessions:  N/A   Compulsions:  N/A   Inattention:  N/A   Hyperactivity/Impulsivity:  N/A   Oppositional/Defiant Behaviors:  N/A   Emotional Irregularity:  N/A   Other Mood/Personality Symptoms:  n/a    Mental Status Exam Appearance and self-care  Stature:  Average   Weight:  Average weight   Clothing:  Casual   Grooming:  Normal   Cosmetic use:  None   Posture/gait:  Normal   Motor activity:  Not Remarkable   Sensorium  Attention:  Normal   Concentration:  Normal  Orientation:  X5   Recall/memory:  Normal   Affect and Mood  Affect:  Tearful   Mood:  Depressed   Relating  Eye contact:  Normal   Facial expression:  Responsive   Attitude toward examiner:  Cooperative   Thought and Language  Speech flow: Normal   Thought content:  Appropriate to Mood and Circumstances   Preoccupation:  None   Hallucinations:  None   Organization:  Coherent   [employer] of Knowledge:  Average   Intelligence:  Average   Abstraction:  Normal   Judgement:  Fair   Dance Movement Psychotherapist:  Adequate   Insight:  Fair   Decision Making:  Impulsive   Social Functioning  Social  Maturity:  Isolates   Social Judgement:  Normal   Stress  Stressors:  Other (Comment) (substance abuse)   Coping Ability:  Overwhelmed   Skill Deficits:  Decision making   Supports:  Family; Friends/Service system     Religion: Religion/Spirituality Are You A Religious Person?: No How Might This Affect Treatment?: n/a  Leisure/Recreation: Leisure / Recreation Do You Have Hobbies?: No  Exercise/Diet: Exercise/Diet Do You Exercise?: No Have You Gained or Lost A Significant Amount of Weight in the Past Six Months?: No Do You Follow a Special Diet?: No Do You Have Any Trouble Sleeping?: Yes Explanation of Sleeping Difficulties: Poor sleep   CCA Employment/Education Employment/Work Situation: Employment / Work Situation Employment Situation: Unemployed Patient's Job has Been Impacted by Current Illness: No Has Patient ever Been in Equities Trader?: No  Education: Education Is Patient Currently Attending School?: No Last Grade Completed: 10 Did You Product Manager?: No Did You Have An Individualized Education Program (IIEP): No Did You Have Any Difficulty At School?: No Patient's Education Has Been Impacted by Current Illness: No   CCA Family/Childhood History Family and Relationship History: Family history Marital status: Other (comment) (relationship) Does patient have children?: Yes How many children?: 3 How is patient's relationship with their children?: n/a  Childhood History:  Childhood History By whom was/is the patient raised?: Other (Comment) (n/a) Did patient suffer any verbal/emotional/physical/sexual abuse as a child?: No Did patient suffer from severe childhood neglect?: No Has patient ever been sexually abused/assaulted/raped as an adolescent or adult?: No Was the patient ever a victim of a crime or a disaster?: No Witnessed domestic violence?: No Has patient been affected by domestic violence as an adult?: No       CCA Substance  Use Alcohol/Drug Use: Alcohol / Drug Use Pain Medications: n/a Prescriptions: n/a Over the Counter: n/a History of alcohol / drug use?: Yes Longest period of sobriety (when/how long): n/a Negative Consequences of Use:  (n/a) Withdrawal Symptoms: None                         ASAM's:  Six Dimensions of Multidimensional Assessment  Dimension 1:  Acute Intoxication and/or Withdrawal Potential:   Dimension 1:  Description of individual's past and current experiences of substance use and withdrawal: Patient reports cocaine use  Dimension 2:  Biomedical Conditions and Complications:   Dimension 2:  Description of patient's biomedical conditions and  complications: none reported  Dimension 3:  Emotional, Behavioral, or Cognitive Conditions and Complications:  Dimension 3:  Description of emotional, behavioral, or cognitive conditions and complications: She reports crying, irritability, anhedonia, isolation, sleep issues, appetite decreased  Dimension 4:  Readiness to Change:  Dimension 4:  Description of Readiness to Change criteria: Seeking  Treatment  Dimension 5:  Relapse, Continued use, or Continued Problem Potential:  Dimension 5:  Relapse, continued use, or continued problem potential critiera description: Continued use despite emotional concerns  Dimension 6:  Recovery/Living Environment:  Dimension 6:  Recovery/Iiving environment criteria description: Lives with boyfriend and children  ASAM Severity Score: ASAM's Severity Rating Score: 6  ASAM Recommended Level of Treatment:     Substance use Disorder (SUD) Substance Use Disorder (SUD)  Checklist Symptoms of Substance Use: Persistent desire or unsuccessful efforts to cut down or control use  Recommendations for Services/Supports/Treatments: Recommendations for Services/Supports/Treatments Recommendations For Services/Supports/Treatments: Facility Based Crisis  Disposition Recommendation per psychiatric provider:  [facility]   DSM5 Diagnoses: Patient Active Problem List   Diagnosis Date Noted   Hoarseness 01/25/2024   Foot pain, left 12/11/2022   Insomnia 10/01/2022   Cough with hemoptysis 08/28/2022   High risk medication use: valproic acid  08/17/2022   History of traumatic brain injury 08/05/2022   Severe episode of recurrent major depressive disorder, without psychotic features (HCC) 08/05/2022   Generalized anxiety disorder with panic attacks 08/05/2022   Other chronic pain 08/05/2022   Peptic ulcer disease 07/30/2022   Recurrent pneumonia 07/30/2022   Obesity (BMI 35.0-39.9 without comorbidity) 07/30/2022   Vaginal discharge 06/16/2022   Burning with urination 06/16/2022   Major depressive disorder 07/16/2020   Encounter for gynecological examination with Papanicolaou smear of cervix 07/16/2020   IUD (intrauterine device) in place 07/16/2020   History of abnormal cervical Pap smear 07/16/2020   Ulceration of vulva 04/09/2020   Visit for insertion of intrauterine device 07/30/2016   ASCUS with positive high risk HPV cervical 12/14/2015   Tobacco use disorder 12/10/2015   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     Referrals to Alternative Service(s): Referred to Alternative Service(s):   Place:   Date:   Time:    Referred to Alternative Service(s):   Place:   Date:   Time:    Referred to Alternative Service(s):   Place:   Date:   Time:    Referred to Alternative Service(s):   Place:   Date:   Time:     Westin Knotts C Dorrine Montone, LCMHCA

## 2024-11-01 ENCOUNTER — Encounter (HOSPITAL_COMMUNITY): Payer: Self-pay

## 2024-11-01 DIAGNOSIS — F1420 Cocaine dependence, uncomplicated: Secondary | ICD-10-CM

## 2024-11-01 DIAGNOSIS — F332 Major depressive disorder, recurrent severe without psychotic features: Principal | ICD-10-CM

## 2024-11-01 MED ORDER — DULOXETINE HCL 20 MG PO CPEP
40.0000 mg | ORAL_CAPSULE | Freq: Every day | ORAL | Status: DC
  Administered 2024-11-01: 40 mg via ORAL
  Filled 2024-11-01: qty 2

## 2024-11-01 MED ORDER — LURASIDONE HCL 40 MG PO TABS
60.0000 mg | ORAL_TABLET | Freq: Every day | ORAL | Status: DC
  Administered 2024-11-01: 60 mg via ORAL
  Filled 2024-11-01: qty 2

## 2024-11-01 MED ORDER — ALBUTEROL SULFATE HFA 108 (90 BASE) MCG/ACT IN AERS: 2.0000 | INHALATION_SPRAY | Freq: Four times a day (QID) | RESPIRATORY_TRACT | Status: DC | PRN

## 2024-11-01 MED ORDER — PANTOPRAZOLE SODIUM 40 MG PO TBEC
40.0000 mg | DELAYED_RELEASE_TABLET | Freq: Every day | ORAL | Status: DC
  Administered 2024-11-01 – 2024-11-02 (×2): 40 mg via ORAL
  Filled 2024-11-01 (×2): qty 1

## 2024-11-01 MED ORDER — OMEPRAZOLE MAGNESIUM 20 MG PO TBEC: 40.0000 mg | DELAYED_RELEASE_TABLET | ORAL | Status: DC
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
## 2024-11-01 NOTE — Group Note (Signed)
 Date:  11/01/2024 Time:  8:48 PM  Group Topic/Focus: Gut flora Self Care:   The focus of this group is to help patients understand the importance of self-care in order to improve or restore emotional, physical, spiritual, interpersonal, and financial health.    Participation Level:  Did Not Attend  [name] 11/01/2024, 8:48 PM

## 2024-11-01 NOTE — Tx Team (Signed)
 Initial Treatment Plan 11/01/2024 12:03 AM Powell [name] FMW:[id number]    PATIENT STRESSORS: Financial difficulties   Substance abuse     PATIENT STRENGTHS: Communication skills  Supportive family/friends    PATIENT IDENTIFIED PROBLEMS: Depression  Substance use   I need help to stop using drugs                 DISCHARGE CRITERIA:  Improved stabilization in mood, thinking, and/or behavior Motivation to continue treatment in a less acute level of care Verbal commitment to aftercare and medication compliance  PRELIMINARY DISCHARGE PLAN: Attend aftercare/continuing care group Attend PHP/IOP Outpatient therapy Return to previous living arrangement  PATIENT/FAMILY INVOLVEMENT: This treatment plan has been presented to and reviewed with the patient, [name].  The patient and family have been given the opportunity to ask questions and make suggestions.  Slater MALVA Edin, RN 11/01/2024, 12:03 AM

## 2024-11-01 NOTE — [facility] Group Notes (Signed)
Patient did not attend the Pharmacy group.

## 2024-11-01 NOTE — BH IP Treatment Plan (Signed)
 Interdisciplinary Treatment and Diagnostic Plan Update  11/01/2024 Time of Session: 1015am [name] MRN: [id number]  Principal Diagnosis: MDD (major depressive disorder), recurrent episode, severe (HCC)  Secondary Diagnoses: Principal Problem:   MDD (major depressive disorder), recurrent episode, severe (HCC)   Current Medications:  Current Facility-Administered Medications  Medication Dose Route Frequency Provider Last Rate Last Admin   acetaminophen  (TYLENOL ) tablet 650 mg  650 mg Oral Q6H PRN Randall Starlyn CHRISTELLA, NP   650 mg at 11/01/24 9367   haloperidol  (HALDOL ) tablet 5 mg  5 mg Oral TID PRN Randall Starlyn CHRISTELLA, NP       And   diphenhydrAMINE  (BENADRYL ) capsule 50 mg  50 mg Oral TID PRN Randall Starlyn CHRISTELLA, NP       haloperidol  lactate (HALDOL ) injection 5 mg  5 mg Intramuscular TID PRN Randall Starlyn CHRISTELLA, NP       And   diphenhydrAMINE  (BENADRYL ) injection 50 mg  50 mg Intramuscular TID PRN Randall Starlyn CHRISTELLA, NP       And   LORazepam  (ATIVAN ) injection 2 mg  2 mg Intramuscular TID PRN Randall Starlyn CHRISTELLA, NP       haloperidol  lactate (HALDOL ) injection 10 mg  10 mg Intramuscular TID PRN Randall Starlyn CHRISTELLA, NP       And   diphenhydrAMINE  (BENADRYL ) injection 50 mg  50 mg Intramuscular TID PRN Randall Starlyn CHRISTELLA, NP       And   LORazepam  (ATIVAN ) injection 2 mg  2 mg Intramuscular TID PRN Byungura, Veronique M, NP       hydrOXYzine  (ATARAX ) tablet 25 mg  25 mg Oral TID PRN Randall Starlyn CHRISTELLA, NP   25 mg at 10/31/24 2003   magnesium  hydroxide (MILK OF MAGNESIA) suspension 30 mL  30 mL Oral Daily PRN Byungura, Veronique M, NP       nicotine  polacrilex (NICORETTE ) gum 2 mg  2 mg Oral PRN Trudy Carwin, NP   2 mg at 11/01/24 1046   pantoprazole  (PROTONIX ) EC tablet 40 mg  40 mg Oral Daily Prentis Kitchens A, DO   40 mg at 11/01/24 9367   traZODone  (DESYREL ) tablet 50 mg  50 mg Oral QHS PRN Byungura, Veronique M, NP   50 mg at 10/31/24 2003   PTA  Medications: Medications Prior to Admission  Medication Sig Dispense Refill Last Dose/Taking   albuterol  (VENTOLIN  HFA) 108 (90 Base) MCG/ACT inhaler Inhale 2 puffs into the lungs every 6 (six) hours as needed for wheezing or shortness of breath. (Patient not taking: Reported on 10/31/2024) 8 g 0    amphetamine-dextroamphetamine (ADDERALL XR) 5 MG 24 hr capsule Take 5 mg by mouth daily.      DULoxetine  (CYMBALTA ) 20 MG capsule Take 1 capsule (20 mg total) by mouth daily. 30 capsule 0    Lurasidone  HCl 120 MG TABS Take 120 mg by mouth at bedtime.      omeprazole  (PRILOSEC OTC) 20 MG tablet Take 40 mg by mouth daily.      traZODone  (DESYREL ) 100 MG tablet Take 1 tablet (100 mg total) by mouth at bedtime. 30 tablet 0     Patient Stressors: Financial difficulties   Substance abuse    Patient Strengths: Manufacturing systems engineer  Supportive family/friends   Treatment Modalities: Medication Management, Group therapy, Case management,  1 to 1 session with clinician, Psychoeducation, Recreational therapy.   Physician Treatment Plan for Primary Diagnosis: MDD (major depressive disorder), recurrent episode, severe (HCC) Long Term Goal(s):  Short Term Goals:    Medication Management: Evaluate patient's response, side effects, and tolerance of medication regimen.  Therapeutic Interventions: 1 to 1 sessions, Unit Group sessions and Medication administration.  Evaluation of Outcomes: Not Progressing  Physician Treatment Plan for Secondary Diagnosis: Principal Problem:   MDD (major depressive disorder), recurrent episode, severe (HCC)  Long Term Goal(s):     Short Term Goals:       Medication Management: Evaluate patient's response, side effects, and tolerance of medication regimen.  Therapeutic Interventions: 1 to 1 sessions, Unit Group sessions and Medication administration.  Evaluation of Outcomes: Not Progressing   RN Treatment Plan for Primary Diagnosis: MDD (major depressive  disorder), recurrent episode, severe (HCC) Long Term Goal(s): Knowledge of disease and therapeutic regimen to maintain health will improve  Short Term Goals: Ability to remain free from injury will improve, Ability to verbalize frustration and anger appropriately will improve, Ability to demonstrate self-control, Ability to participate in decision making will improve, Ability to verbalize feelings will improve, Ability to disclose and discuss suicidal ideas, Ability to identify and develop effective coping behaviors will improve, and Compliance with prescribed medications will improve  Medication Management: RN will administer medications as ordered by provider, will assess and evaluate patient's response and provide education to patient for prescribed medication. RN will report any adverse and/or side effects to prescribing provider.  Therapeutic Interventions: 1 on 1 counseling sessions, Psychoeducation, Medication administration, Evaluate responses to treatment, Monitor vital signs and CBGs as ordered, Perform/monitor CIWA, COWS, AIMS and Fall Risk screenings as ordered, Perform wound care treatments as ordered.  Evaluation of Outcomes: Not Progressing   LCSW Treatment Plan for Primary Diagnosis: MDD (major depressive disorder), recurrent episode, severe (HCC) Long Term Goal(s): Safe transition to appropriate next level of care at discharge, Engage patient in therapeutic group addressing interpersonal concerns.  Short Term Goals: Engage patient in aftercare planning with referrals and resources, Increase social support, Increase ability to appropriately verbalize feelings, Increase emotional regulation, Facilitate acceptance of mental health diagnosis and concerns, Facilitate patient progression through stages of change regarding substance use diagnoses and concerns, Identify triggers associated with mental health/substance abuse issues, and Increase skills for wellness and recovery  Therapeutic  Interventions: Assess for all discharge needs, 1 to 1 time with Social worker, Explore available resources and support systems, Assess for adequacy in community support network, Educate family and significant other(s) on suicide prevention, Complete Psychosocial Assessment, Interpersonal group therapy.  Evaluation of Outcomes: Not Progressing   Progress in Treatment: Attending groups: No. Participating in groups: No. Taking medication as prescribed: Yes. Toleration medication: Yes. Family/Significant other contact made: No, will contact:  consents pending Patient understands diagnosis: Yes. Discussing patient identified problems/goals with staff: Yes. Medical problems stabilized or resolved: Yes. Denies suicidal/homicidal ideation: Yes. Issues/concerns per patient self-inventory: No.  New problem(s) identified: No, Describe:  none  New Short Term/Long Term Goal(s): detox, medication management for mood stabilization; elimination of SI thoughts; development of comprehensive mental wellness/sobriety plan   Patient Goals:  Lower my depression  Discharge Plan or Barriers: Patient recently admitted. CSW will continue to follow and assess for appropriate referrals and possible discharge planning.    Reason for Continuation of Hospitalization: Depression Medication stabilization Suicidal ideation  Estimated Length of Stay: 5-7 days  Last 3 [place] Suicide Severity Risk Score: Flowsheet Row Ad[facility] (Current[facility] Most recent reading at 10/31/2024 11:21 PM ED from 10/31/2024 in [facility] Most recent  reading at 10/31/2024 11:19 AM ED from 10/31/2024 in [facility] Most recent reading at 10/31/2024 12:03 AM  C-SSRS RISK CATEGORY Moderate Risk Moderate Risk No Risk    Last PHQ 2/9 Scores:    01/25/2024    1:53 PM 12/11/2022    9:52 AM 09/09/2022    9:58 AM  Depression  screen PHQ 2/9  Decreased Interest 0 0 0  Down, Depressed, Hopeless 0 0 0  PHQ - 2 Score 0 0 0  Altered sleeping  0   Tired, decreased energy  3   Change in appetite  3   Feeling bad or failure about yourself   0   Trouble concentrating  0   Moving slowly or fidgety/restless  0   Suicidal thoughts  0   PHQ-9 Score  6    Difficult doing work/chores  Somewhat difficult      Data saved with a previous flowsheet row definition    Scribe for Treatment Team: Jenkins LULLA Primer, LCSWA 11/01/2024 11:46 AM
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
## 2024-11-01 NOTE — Group Note (Signed)
 Date:  11/01/2024 Time:  8:26 PM  Group Topic/Focus:  Wrap-Up Group:   The focus of this group is to help patients review their daily goal of treatment and discuss progress on daily workbooks.    Participation Level:  Did Not Attend  Participation Quality:  none  Affect: n/a  Cognitive:  n/a  Insight: None  Engagement in Group:  None  Modes of Intervention:  none  Additional Comments:   Pt did not attend NA meeting  Neelah Mannings A Jedi Catalfamo 11/01/2024, 8:26 PM

## 2024-11-01 NOTE — Progress Notes (Signed)
 Patient approached RN complaining of a rash on her left hand. Pt's left hand observed by clinical research associate. Scattered flat red dots visualized across patient's hand. Pt denies pruritus or pain with rash. Pt's assigned provider Prentis, MD notified on secure chat.

## 2024-11-01 NOTE — Progress Notes (Signed)

## 2024-11-01 NOTE — Group Note (Signed)
 Recreation Therapy Group Note   Group Topic:Leisure Education  Group Date: 11/01/2024 Start Time: 0930 End Time: 1020 Facilitators: Eduard Penkala-McCall, LRT,CTRS Location: [address] Dayroom   Group Topic: Leisure Education   Goal Area(s) Addresses:  Patient will identify positive leisure activities for use post discharge. Patient will identify at least one positive benefit of participation in leisure activities.    Behavioral Response:    Intervention: Innovation, Individual Creativity, Construction Paper, Markers    Activity: Patients were asked to create their own community program for the community. Patients were to come up with a name for their program, audience program is geared towards, where program will be conducted, hours of operation and what are the benefits of the program.   Education:  Leisure Scientist, Physiological, Communication, Teamwork, Discharge Planning   Education Outcome: Acknowledges education/In group clarification offered/Needs additional education.    Affect/Mood: N/A   Participation Level: Did not attend    Clinical Observations/Individualized Feedback:    Plan: Continue to engage patient in RT group sessions 2-3x/week.   Adrion Menz-McCall, LRT,CTRS 11/01/2024 2:48 PM

## 2024-11-01 NOTE — Group Note (Unsigned)
 Date:  11/01/2024 Time:  2:08 PM  Group Topic/Focus:  Spirituality:   The focus of this group is to discuss how one's spirituality can aide in recovery.     Participation Level:  {BHH PARTICIPATION OZCZO:77735}  Participation Quality:  {BHH PARTICIPATION QUALITY:22265}  Affect:  {BHH AFFECT:22266}  Cognitive:  {BHH COGNITIVE:22267}  Insight: {BHH Insight2:20797}  Engagement in Group:  {BHH ENGAGEMENT IN HMNLE:77731}  Modes of Intervention:  {BHH MODES OF INTERVENTION:22269}  Additional Comments:  ***  Kayson Tasker L Demondre Aguas 11/01/2024, 2:08 PM

## 2024-11-01 NOTE — Group Note (Signed)
 Date:  11/01/2024 Time:  9:56 AM  Group Topic/Focus:  Goals Group:   The focus of this group is to help patients establish daily goals to achieve during treatment and discuss how the patient can incorporate goal setting into their daily lives to aide in recovery.    Participation Level:  Did Not Attend  Participation Quality:  Did Not Attend  Affect:  Did Not Attend  Cognitive:  Did Not Attend  Insight: None  Engagement in Group:  Did Not Attend  Modes of Intervention:  Did Not Attend  Additional Comments:  Did Not Attend  Avelina DELENA Humphreys 11/01/2024, 9:56 AM

## 2024-11-01 NOTE — BHH Group Notes (Signed)
 Patient did not attend the Recreation Therapy group.

## 2024-11-01 NOTE — Progress Notes (Signed)
(  Sleep Hours) - 7.75 (Any PRNs that were needed, meds refused, or side effects to meds)- Trazodone , Vistaril , Milk of mag (Any disturbances and when (visitation, over night)- n/a (Concerns raised by the patient)- none (SI/HI/AVH)- Denies

## 2024-11-01 NOTE — BHH Counselor (Signed)
 Adult Comprehensive Assessment  Patient ID: [name], female   DOB: [birth year], 38 y.o.   MRN: [id number]  Information Source: Information source: Patient  Current Stressors:  Patient states their primary concerns and needs for treatment are:: Depression. I made a mistake, I'm not as bad off as staying in a place like this. Patient denies SI, HI, and AVH. 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 I smoke marijuana here and there. Bereavement / Loss: None reported  Living/Environment/Situation:  Living Arrangements: Children, Spouse/significant other Living conditions (as described by patient or guardian): Fine Who else lives in the home?: Brother in social worker, husband, three children How long has patient lived in current situation?: 5 years What is atmosphere in current home: Comfortable, Paramedic, Supportive  Family History:  Marital status: Long term relationship Long term relationship, how long?: 2 years What types of issues is patient dealing with in the relationship?: None reported Are you sexually active?: Yes What is your sexual orientation?: Heterosexual Has your sexual activity been affected by drugs, alcohol, medication, or emotional stress?: None reported Does patient have children?: Yes How many children?: 3 How is patient's relationship with their children?: 67 yo twins and a 52 yo. Great relationship  Childhood History:  By whom was/is the patient  raised?: Father Additional childhood history information: Mom was around when she felt like it Description of patient's relationship with caregiver when they were a child: I loved my dad, my dad is my hero Patient's description of current relationship with people who raised him/her: Patient reports a good relationship with dad How were you disciplined when you got in trouble as a child/adolescent?: Get a whooping Does patient have siblings?: Yes Number of Siblings: 5 Description of patient's current relationship with siblings: 3 sisters and 2 brothers patient reports good relationship Did patient suffer any verbal/emotional/physical/sexual abuse as a child?: No Did patient suffer from severe childhood neglect?: No Has patient ever been sexually abused/assaulted/raped as an adolescent or adult?: No Was the patient ever a victim of a crime or a disaster?: No Witnessed domestic violence?: No Has patient been affected by domestic violence as an adult?: No  Education:  Highest grade of school patient has completed: 10th Currently a student?: No Learning disability?: No  Employment/Work Situation:   Employment Situation: Unemployed Patient's Job has Been Impacted by Current Illness: No What is the Longest Time Patient has Held a Job?: 5 years Where was the Patient Employed at that Time?: Restaurant Has Patient ever Been in the [employer]?: No  Financial Resources:   Financial resources: Income from spouse, Medicaid Does patient have a representative payee or guardian?: No  Alcohol/Substance Abuse:   What has been your use of drugs/alcohol within the last 12 months?: Patient endorses daily cocaine use for the last 6 months. Denies alcohol use. Endorses occasional marijuana use. If attempted suicide, did drugs/alcohol play a role in this?: No Alcohol/Substance Abuse Treatment Hx: Denies past history Has alcohol/substance abuse ever caused legal problems?: Yes (DWI 5 years  ago)  Social Support System:  Patient's Community Support System: Good Describe Community Support System: Good. My sister, dad, fiancee, and mom. Also my brother in law. Type of faith/religion: None reported How does patient's faith help to cope with current illness?: N/A  Leisure/Recreation:   Do You Have Hobbies?: No  Strengths/Needs:   What is the patient's perception of their strengths?: I'm a good mom. I live to be a mom, it's just what I do. Patient states they can use these personal strengths during their treatment to contribute to their recovery: They're the only reason I'm here Patient states these barriers may affect/interfere with their treatment: Except the fact that I wanna go home Patient states these barriers may affect their return to the community: None reported  Discharge Plan:   Currently receiving community mental health services: No Patient states concerns and preferences for aftercare planning are: Patient is not interested in either Patient states they will know when they are safe and ready for discharge when: I'm safe and ready for discharge now Does patient have access to transportation?: Yes Does patient have financial barriers related to discharge medications?: No Patient description of barriers related to discharge medications: None reported Will patient be returning to same living situation after discharge?: Yes  Summary/Recommendations:   Summary and Recommendations (to be completed by the evaluator): [name] is a 38 yo female voluntarily admitted to [facility] secondary to [facility] due to substance abuse. Patient denies any SI, HI, and AVH. Patient states she made a knee-jerk decision and decided to seek help for her cocaine use, patient states her only goal is to go home. Patient denies any stressors. Patient denies any hx of trauma. Patient is a stay at home mom, unemployed and receives financial support from her fiancee. Patient endorses daily  cocaine use and occasional marijuana use, denies alcohol use. UDS positive for cocaine and marijuana. Patient denies any hx of substance abuse. Patient states she did have a DWI approx 5 years ago. Patient is no longer interested in any further substance use tx, psychiatry, or therapy. Patient stated I'm too busy for that. I've seen a therapist before, talking to a stranger once a week isn't going to help me. Patient stated she refuses to attend groups while hospitalized and agreed to getting set up with a therapist if it helps me get out of here faster. Patient presented with a flat affect and depressed deameanor. Patient appears to be engaging in minimizing behaviors and is resistant to any care.  While here, [place] can benefit from crisis stabilization, medication management, therapeutic milieu, and referrals for services.   Louetta Lame. 11/01/2024

## 2024-11-01 NOTE — Plan of Care (Signed)
   Problem: Education: Goal: Knowledge of [place] [facility] Education information/materials will improve Outcome: Progressing Goal: Emotional status will improve Outcome: Progressing Goal: Mental status will improve Outcome: Progressing Goal: Verbalization of understanding the information provided will improve Outcome: Progressing

## 2024-11-01 NOTE — Progress Notes (Signed)
 Patient denies SI, HI, AVH. Patient stated they slept Good last night. Scored 0/10 on anxiety, feeling of hopelessness, and depression. Patient goal is to work on their Depression. Patient has been calm and cooperative.    11/01/24 1000  Psych Admission Type (Psych Patients Only)  Admission Status Voluntary  Psychosocial Assessment  Patient Complaints Depression  Eye Contact Fair  Facial Expression Flat  Affect Appropriate to circumstance  Speech Logical/coherent  Interaction Assertive  Motor Activity Other (Comment) (WDL)  Appearance/Hygiene Unremarkable  Behavior Characteristics Cooperative;Appropriate to situation  Mood Depressed  Thought Process  Coherency WDL  Content WDL  Delusions None reported or observed  Perception WDL  Hallucination None reported or observed  Judgment Poor  Confusion None  Danger to Self  Current suicidal ideation? Denies  Self-Injurious Behavior No self-injurious ideation or behavior indicators observed or expressed   Agreement Not to Harm Self Yes  Description of Agreement Verbal  Danger to Others  Danger to Others None reported or observed

## 2024-11-02 MED ORDER — NICOTINE POLACRILEX 2 MG MT GUM: 2.0000 mg | CHEWING_GUM | OROMUCOSAL | 0 refills | Status: AC | PRN

## 2024-11-02 MED ORDER — HYDROXYZINE HCL 25 MG PO TABS: 25.0000 mg | ORAL_TABLET | Freq: Three times a day (TID) | ORAL | 0 refills | Status: AC | PRN

## 2024-11-02 MED ORDER — LURASIDONE HCL 120 MG PO TABS: 60.0000 mg | ORAL_TABLET | Freq: Every day | ORAL | Status: AC

## 2024-11-02 MED ORDER — DULOXETINE HCL 20 MG PO CPEP: 40.0000 mg | ORAL_CAPSULE | Freq: Every day | ORAL | Status: DC

## 2024-11-02 MED ORDER — ALBUTEROL SULFATE HFA 108 (90 BASE) MCG/ACT IN AERS: 2.0000 | INHALATION_SPRAY | Freq: Four times a day (QID) | RESPIRATORY_TRACT | 0 refills | Status: AC | PRN

## 2024-11-02 MED ORDER — DULOXETINE HCL 40 MG PO CPEP: 40.0000 mg | ORAL_CAPSULE | Freq: Every day | ORAL | 0 refills | Status: AC
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
## 2024-11-02 NOTE — Plan of Care (Signed)
   Problem: Education: Goal: Knowledge of [place] [facility] Education information/materials will improve Outcome: Progressing Goal: Emotional status will improve Outcome: Progressing Goal: Mental status will improve Outcome: Progressing Goal: Verbalization of understanding the information provided will improve Outcome: Progressing

## 2024-11-02 NOTE — Progress Notes (Signed)
 Note Type: Child Protective Services Report  [facility] [phone]  This clinical research associate as a educational psychologist a CPS report to ensure the safety of the children who live in the home of this patient and are under her care.   This clinical research associate spoke with Armida Rower from [place] CPS to report that this patient is using powder cocaine daily and refused further substance use treatment. This clinical research associate also informed Armida of the collateral information received by Dr. Morene Cleveland, M.D. from the patient's fiancee, Curtistine Cashing, regarding his own cocaine use and the impact it had on his health. While Mr.Brogan stated to Dr.Crawford that he has stopped using cocaine in the last two months, he still resides in the home with the children, which is cause for concern. This clinical research associate informed Armida that we do not suspect abuse or neglect of basic needs on behalf of the mother but that the substance abuse is cause of concern, especially due to her being the main caretaker, and the possibility of exposure to the substance.   This writer notified Armida that any further investigation will have to be conducted outside of the hospital as this patient discharged today 11/02/24. Armida stated she completed the report and will file it to her supervisor.   SIGNED: Farah Lepak Nunez-Uva, LCSW-A
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
## 2024-11-02 NOTE — Discharge Instructions (Addendum)
 Activity: as tolerated  Diet: heart healthy  -Follow-up with your outpatient psychiatric provider -instructions on appointment date, time, and address (location) are provided to you in discharge paperwork.  -Take your psychiatric medications as prescribed at discharge - instructions are provided to you in the discharge paperwork  -Follow-up with outpatient primary care doctor and other specialists -for management of chronic medical disease, including: TBI, PNES, pepptic ulcer disease, abnormal pap smear. High cholesterol noted. High blood pressure noted.   -Testing: Follow-up with outpatient provider for abnormal lab results: LDL 105. Blood pressures sometimes into 140s/100s, will need to monitor at home to ensure accuracy of readings. Please follow with primary care provider for this.  -Recommend abstinence from alcohol, tobacco, and other illicit drug use at discharge.   -If your psychiatric symptoms recur, worsen, or if you have side effects to your psychiatric medications, call your outpatient psychiatric provider, 911, 988 or go to the nearest emergency department.  -If suicidal thoughts recur, call your outpatient psychiatric provider, 911, 988 or go to the nearest emergency department.  Substance Abuse Resources     SUBSTANCE USE TREATMENT for Medicaid and State Funded/IPRS  [facility] [address] [place], [place], [address] [phone] phone NOTE: [facility] is no longer offering IOP services.  Serves those who are low-income or have no insurance.  [facility] [address] [place], [place], [address] [phone] phone [phone] fax NOTE: Does have Substance Abuse-Intensive Outpatient Program [facility]) as well as transitional housing if eligible.  [facility] [address] [place], [place], [address] [phone] phone [phone] fax  [facility] [phone] [place], [place], [address] [phone]  phone [phone] fax    HALFWAY HOUSES:  [facility] [phone]  [email or web address]     12 STEP PROGRAMS:  Alcoholics Anonymous of [place] softwarechalet.be  Narcotics Anonymous of [place] hitprotect.dk  Al-Anon of [place], [place] www.[place]alanon.org/find-meetings.html  Nar-Anon https://nar-anon.org/find-a-meetin      List of Residential placements:   [facility] in [place]: [phone]  [facility]: [phone]  [place], [place] [phone]: Female and female facility; 30-day program: (uninsured and Medicaid such as [place], [place], [place], partners)  [facility]: [phone]; men and women's facility; 28 days; Can have Medicaid tailored plan Tour Manager or Partners)  [facility]: [phone] [place]; 28 day program; must be fully detox; tailored Medicaid or no insurance  [address] in [place], [place]; [phone]; 28 day all males program; no insurance accepted  [facility] in [place]: [place] [phone] (no insurance or Medicaid only); 90 days; outpatient services but provide housing in apartments [place]  [facility] Admission: [phone]: Patient must complete phone screening for placement: [place], [place]; 6 month program; uninsured, Medicaid, and [employer].   [facility]: no insurance required; [phone]  [facility]: [phone]; Intake: [place]; Must fill out application online; Steffan Delay [phone] x [address] [facility] in [place], [place]: [phone]; Admissions Coordinators Mr. Marinda or Alm Eagles; 90 day program.  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 Free program; There is an initial $300 fee however, they are willing to work with patients regarding that. Application is online.  [facility]: Admissions [phone] [place] Free ext [phone]; Any 7-90 day program is out of pocket; 12 month program is free of charge; there is a $275 entry fee; Patient is responsible for own transportation

## 2024-11-02 NOTE — Progress Notes (Signed)
°  [facility] Adult Case Management Discharge Plan :  Will you be returning to the same living situation after discharge:  Yes,  patient will be returning home.  At discharge, do you have transportation home?: Yes,  patient's father, Samarrah Tranchina, will be providing transportation at 8:45 am. Do you have the ability to pay for your medications: Yes,  patient has active health insurance.   Release of information consent forms completed and in the chart;  Patient's signature needed at discharge.  Patient to Follow up at:  Follow-up Information     [facility], [facility] Follow up on 11/07/2024.   Why: Please call or go to this provider on 11/07/24 at 8:30 am to schedule an assessment appointment, to obtain therapy and medication management services. Contact information: [address] [place] [address] [phone]                 Next level of care provider has access to [facility] Link:no  Safety Planning and Suicide Prevention discussed: Yes,  completed with patient. Suicide Prevention Education was reviewed thoroughly with patient, including risk factors, warning signs, and what to do. Mobile Crisis services were described and that telephone number pointed out, with encouragement to patient to put this number in personal cell phone. Brochure was provided to patient to share with natural supports. Patient acknowledged the ways in which they are at risk, and how working through each of their issues can gradually start to reduce their risk factors. Patient was encouraged to think of the information in the context of people in their own lives. Patient denied having access to firearms Patient verbalized understanding of information provided. Patient endorsed a desire to live.      Has patient been referred to the Quitline?: Patient refused referral for treatment  Patient has been referred for addiction treatment: Patient refused referral for treatment; referral information given  to patient at discharge.  Louetta Lame, LCSWA 11/02/2024, 8:47 AM

## 2024-11-02 NOTE — [facility] Suicide Risk Assessment (Signed)
 [facility] INPATIENT:  Family/Significant Other Suicide Prevention Education  Suicide Prevention Education:   Suicide Prevention Education was reviewed thoroughly with patient, including risk factors, warning signs, and what to do. Mobile Crisis services were described and that telephone number pointed out, with encouragement to patient to put this number in personal cell phone. Brochure was provided to patient to share with natural supports. Patient acknowledged the ways in which they are at risk, and how working through each of their issues can gradually start to reduce their risk factors. Patient was encouraged to think of the information in the context of people in their own lives. Patient denied having access to firearms Patient verbalized understanding of information provided. Patient endorsed a desire to live.    [name] 11/02/2024, 8:51 AM

## 2024-11-02 NOTE — Progress Notes (Signed)
 Pt discharged to lobby. Pt was stable and appreciative at that time. All papers and prescriptions were given and valuables returned. Verbal understanding expressed. Denies SI/HI and A/VH. Pt given opportunity to express concerns and ask questions.

## 2024-11-02 NOTE — Progress Notes (Signed)
°   11/02/24 0800  Psych Admission Type (Psych Patients Only)  Admission Status Voluntary  Psychosocial Assessment  Patient Complaints None  Eye Contact Fair  Facial Expression Flat  Affect Appropriate to circumstance  Speech Logical/coherent  Interaction Assertive  Motor Activity Other (Comment) (WDL)  Appearance/Hygiene Unremarkable  Behavior Characteristics Appropriate to situation  Mood Depressed  Thought Process  Coherency WDL  Content WDL  Delusions None reported or observed  Perception WDL  Hallucination None reported or observed  Judgment Poor  Confusion None  Danger to Self  Current suicidal ideation? Denies  Self-Injurious Behavior No self-injurious ideation or behavior indicators observed or expressed   Agreement Not to Harm Self Yes  Description of Agreement Verbal  Danger to Others  Danger to Others None reported or observed
